# Patient Record
Sex: Female | Born: 1945 | Race: White | Hispanic: No | Marital: Married | Smoking: Never smoker
Health system: Southern US, Community
[De-identification: ages and names within clinical notes are randomized; demographics above are authoritative.]

## PROBLEM LIST (undated history)

## (undated) DIAGNOSIS — K219 Gastro-esophageal reflux disease without esophagitis: Secondary | ICD-10-CM

## (undated) DIAGNOSIS — M1990 Unspecified osteoarthritis, unspecified site: Secondary | ICD-10-CM

## (undated) DIAGNOSIS — H269 Unspecified cataract: Secondary | ICD-10-CM

## (undated) DIAGNOSIS — R112 Nausea with vomiting, unspecified: Secondary | ICD-10-CM

## (undated) DIAGNOSIS — H3530 Unspecified macular degeneration: Secondary | ICD-10-CM

## (undated) DIAGNOSIS — T4145XA Adverse effect of unspecified anesthetic, initial encounter: Secondary | ICD-10-CM

## (undated) DIAGNOSIS — T8859XA Other complications of anesthesia, initial encounter: Secondary | ICD-10-CM

## (undated) DIAGNOSIS — M810 Age-related osteoporosis without current pathological fracture: Secondary | ICD-10-CM

## (undated) DIAGNOSIS — Z9889 Other specified postprocedural states: Secondary | ICD-10-CM

## (undated) HISTORY — PX: OTHER SURGICAL HISTORY: SHX169

## (undated) HISTORY — DX: Age-related osteoporosis without current pathological fracture: M81.0

## (undated) HISTORY — DX: Unspecified cataract: H26.9

## (undated) HISTORY — PX: ABDOMINAL HYSTERECTOMY: SHX81

## (undated) HISTORY — DX: Unspecified macular degeneration: H35.30

---

## 2001-04-24 ENCOUNTER — Ambulatory Visit (HOSPITAL_COMMUNITY): Admission: RE | Admit: 2001-04-24 | Discharge: 2001-04-24 | Payer: Self-pay | Admitting: Pediatrics

## 2001-04-24 ENCOUNTER — Encounter: Payer: Self-pay | Admitting: General Surgery

## 2001-08-31 ENCOUNTER — Ambulatory Visit (HOSPITAL_COMMUNITY): Admission: RE | Admit: 2001-08-31 | Discharge: 2001-08-31 | Payer: Self-pay | Admitting: General Surgery

## 2001-08-31 ENCOUNTER — Encounter: Payer: Self-pay | Admitting: General Surgery

## 2001-12-27 ENCOUNTER — Other Ambulatory Visit: Admission: RE | Admit: 2001-12-27 | Discharge: 2001-12-27 | Payer: Self-pay | Admitting: General Surgery

## 2002-05-08 ENCOUNTER — Encounter: Payer: Self-pay | Admitting: General Surgery

## 2002-05-08 ENCOUNTER — Ambulatory Visit (HOSPITAL_COMMUNITY): Admission: RE | Admit: 2002-05-08 | Discharge: 2002-05-08 | Payer: Self-pay | Admitting: General Surgery

## 2002-05-11 ENCOUNTER — Ambulatory Visit (HOSPITAL_COMMUNITY): Admission: RE | Admit: 2002-05-11 | Discharge: 2002-05-11 | Payer: Self-pay | Admitting: General Surgery

## 2002-05-11 ENCOUNTER — Encounter: Payer: Self-pay | Admitting: General Surgery

## 2003-01-04 ENCOUNTER — Ambulatory Visit (HOSPITAL_COMMUNITY): Admission: RE | Admit: 2003-01-04 | Discharge: 2003-01-04 | Payer: Self-pay | Admitting: Internal Medicine

## 2003-03-22 ENCOUNTER — Ambulatory Visit (HOSPITAL_COMMUNITY): Admission: RE | Admit: 2003-03-22 | Discharge: 2003-03-22 | Payer: Self-pay | Admitting: Pulmonary Disease

## 2003-05-09 ENCOUNTER — Ambulatory Visit (HOSPITAL_COMMUNITY): Admission: RE | Admit: 2003-05-09 | Discharge: 2003-05-09 | Payer: Self-pay | Admitting: General Surgery

## 2003-05-09 ENCOUNTER — Encounter: Payer: Self-pay | Admitting: General Surgery

## 2003-05-10 ENCOUNTER — Encounter: Payer: Self-pay | Admitting: Orthopedic Surgery

## 2003-05-10 ENCOUNTER — Ambulatory Visit (HOSPITAL_COMMUNITY): Admission: RE | Admit: 2003-05-10 | Discharge: 2003-05-10 | Payer: Self-pay | Admitting: Orthopedic Surgery

## 2003-05-24 ENCOUNTER — Ambulatory Visit (HOSPITAL_COMMUNITY): Admission: RE | Admit: 2003-05-24 | Discharge: 2003-05-24 | Payer: Self-pay | Admitting: General Surgery

## 2003-05-24 ENCOUNTER — Encounter: Payer: Self-pay | Admitting: General Surgery

## 2004-05-11 ENCOUNTER — Ambulatory Visit (HOSPITAL_COMMUNITY): Admission: RE | Admit: 2004-05-11 | Discharge: 2004-05-11 | Payer: Self-pay | Admitting: General Surgery

## 2004-05-20 ENCOUNTER — Ambulatory Visit (HOSPITAL_COMMUNITY): Admission: RE | Admit: 2004-05-20 | Discharge: 2004-05-20 | Payer: Self-pay | Admitting: Pediatrics

## 2004-09-18 ENCOUNTER — Ambulatory Visit (HOSPITAL_COMMUNITY): Admission: RE | Admit: 2004-09-18 | Discharge: 2004-09-18 | Payer: Self-pay | Admitting: Pulmonary Disease

## 2005-03-29 ENCOUNTER — Ambulatory Visit: Payer: Self-pay | Admitting: *Deleted

## 2005-04-05 ENCOUNTER — Ambulatory Visit: Payer: Self-pay | Admitting: Cardiology

## 2005-04-05 ENCOUNTER — Encounter (HOSPITAL_COMMUNITY): Admission: RE | Admit: 2005-04-05 | Discharge: 2005-05-05 | Payer: Self-pay | Admitting: *Deleted

## 2005-04-12 ENCOUNTER — Ambulatory Visit: Payer: Self-pay | Admitting: *Deleted

## 2005-05-14 ENCOUNTER — Ambulatory Visit (HOSPITAL_COMMUNITY): Admission: RE | Admit: 2005-05-14 | Discharge: 2005-05-14 | Payer: Self-pay | Admitting: General Surgery

## 2006-05-20 ENCOUNTER — Ambulatory Visit (HOSPITAL_COMMUNITY): Admission: RE | Admit: 2006-05-20 | Discharge: 2006-05-20 | Payer: Self-pay | Admitting: General Surgery

## 2007-06-02 ENCOUNTER — Ambulatory Visit (HOSPITAL_COMMUNITY): Admission: RE | Admit: 2007-06-02 | Discharge: 2007-06-02 | Payer: Self-pay | Admitting: General Surgery

## 2007-06-14 ENCOUNTER — Ambulatory Visit (HOSPITAL_COMMUNITY): Admission: RE | Admit: 2007-06-14 | Discharge: 2007-06-14 | Payer: Self-pay | Admitting: Pulmonary Disease

## 2008-06-07 ENCOUNTER — Ambulatory Visit (HOSPITAL_COMMUNITY): Admission: RE | Admit: 2008-06-07 | Discharge: 2008-06-07 | Payer: Self-pay | Admitting: General Surgery

## 2009-06-13 ENCOUNTER — Ambulatory Visit (HOSPITAL_COMMUNITY): Admission: RE | Admit: 2009-06-13 | Discharge: 2009-06-13 | Payer: Self-pay | Admitting: General Surgery

## 2009-07-30 ENCOUNTER — Encounter (INDEPENDENT_AMBULATORY_CARE_PROVIDER_SITE_OTHER): Payer: Self-pay | Admitting: Orthopedic Surgery

## 2009-07-30 ENCOUNTER — Ambulatory Visit (HOSPITAL_BASED_OUTPATIENT_CLINIC_OR_DEPARTMENT_OTHER): Admission: RE | Admit: 2009-07-30 | Discharge: 2009-07-30 | Payer: Self-pay | Admitting: Orthopedic Surgery

## 2009-09-05 ENCOUNTER — Ambulatory Visit (HOSPITAL_COMMUNITY): Admission: RE | Admit: 2009-09-05 | Discharge: 2009-09-05 | Payer: Self-pay | Admitting: General Surgery

## 2010-05-15 ENCOUNTER — Ambulatory Visit (HOSPITAL_COMMUNITY): Admission: RE | Admit: 2010-05-15 | Discharge: 2010-05-15 | Payer: Self-pay | Admitting: Pulmonary Disease

## 2010-06-19 ENCOUNTER — Ambulatory Visit (HOSPITAL_COMMUNITY): Admission: RE | Admit: 2010-06-19 | Discharge: 2010-06-19 | Payer: Self-pay | Admitting: General Surgery

## 2011-02-26 LAB — POCT HEMOGLOBIN-HEMACUE: Hemoglobin: 14.7 g/dL (ref 12.0–15.0)

## 2011-04-09 NOTE — Op Note (Signed)
NAMEPAULEEN, Gina Gibbs                          ACCOUNT NO.:  [id number]   MEDICAL RECORD NO.:  [id number]                   PATIENT TYPE:  AMB   LOCATION:  DAY                                  FACILITY:  [facility]   PHYSICIAN:  Lionel December, M.D.                 DATE OF BIRTH:  [birth year]   DATE OF PROCEDURE:  01/04/2003  DATE OF DISCHARGE:                                 OPERATIVE REPORT   PROCEDURE:  Esophagogastroduodenoscopy.   ENDOSCOPIST:  Lionel December, M.D.   INDICATIONS:  This patient is a 65 year old Caucasian female who has  symptoms of GERD poorly controlled with therapy.  She started with cough  about 14 months ago and finally responded to Nexium.  She has been  maintained on 40 mg a day and did fine until about 2 months ago when she  started having a feeling of a lump, heartburn and/or chest pain.  Nexium  dose was bumped up to b.i.d.  She was on Fosamax which was discontinued but  she is still having symptoms fairly frequently.  She does not have  dysphagia, epigastric pain or melena.  She is undergoing a diagnostic  esophagogastroduodenoscopy.  The procedure and risks were reviewed with the  patient and informed consent was obtained.   PREOPERATIVE MEDICATIONS:  Cetacaine spray for pharyngeal topical  anesthesia, Demerol 25 mg IV and Versed 5 mg IV in divided dose.   INSTRUMENT:  Olympus video system.   FINDINGS:  Procedure performed in endoscopy suite.  The patient's vital  signs and O2 saturation were monitored during the procedure and remained  stable.  The patient was placed in the left lateral recumbent position and  endoscope was passed via the oropharynx without any difficulty into the  esophagus.   ESOPHAGUS:  Mucosa of the esophagus was normal throughout.  Squamocolumnar  junction was unremarkable.  No hernia was noted.   STOMACH:  It was empty and distended very well with insufflation.  The folds  of the proximal stomach were normal.  Examination of  the mucosa at gastric  body, antrum, pyloric channel, as well as angularis and fundus were normal.   DUODENUM:  Examination of the bulb reveals normal mucosa.  Mucosa and folds  of the second and third part of the duodenum were normal.   Endoscope was withdrawn.  The patient tolerated the procedure well.   FINAL DIAGNOSES:  Normal esophagogastroduodenoscopy.   RECOMMENDATIONS:  1. She will continue antireflux measures as before.  For now I will leave     her on Nexium 40 mg b.i.d. and add     Reglan 10 mg before breakfast and 10 mg before evening meal.  2. She will return for OV in 4 weeks from now.  3. She will continue to hold her Fosamax for now.  Lionel December, M.D.    NR/MEDQ  D:  01/04/2003  T:  01/04/2003  Job:  [id number]   cc:   Ramon Dredge L. Juanetta Gosling, M.D.  [address]  [place]  [place] [address]  Fax: [phone]

## 2011-04-09 NOTE — Procedures (Signed)
[name], Gina Gibbs                ACCOUNT NO.:  [id number]   MEDICAL RECORD NO.:  [id number]          PATIENT TYPE:  OUT   LOCATION:  RAD                           FACILITY:  [facility]   PHYSICIAN:  [place] [place] Bing, M.D.  DATE OF BIRTH:  07-22-1946   DATE OF PROCEDURE:  04/05/2005  DATE OF DISCHARGE:                                  ECHOCARDIOGRAM   REFERRING PHYSICIAN:  Dr. Malvin Johns, Dr. Dorethea Clan.   CLINICAL DATA:  A 65 year old woman with chest pain.   Aorta 2.6.  Left atrium 3.5.  Septum 1.1.  Posterior wall 1.1.  LV diastole  4.1.  LV systole 2.6.   RESULTS:  1.  A technically adequate echocardiographic study.  2.  Normal left and right atrial size.  Normal right ventricular size and      function.  Borderline right ventricular hypertrophy.  3.  Mild aortic valvular sclerosis.  Mild calcification of the proximal      ascending aorta.  4.  Normal tricuspid valve.  Mild regurgitation.  Normal estimated right      ventricular systolic pressure.  5.  Mild mitral valve thickening.  Mild mitral valve prolapse.  Mild annular      calcification.  Trivial late mitral regurgitation.  6.  Normal pulmonic valve and proximal pulmonary artery.  7.  Normal left ventricular size, wall thickness, regional and global      function.  8.  Normal inferior vena cava.      RR/MEDQ  D:  04/05/2005  T:  04/05/2005  Job:  [id number]

## 2011-07-05 ENCOUNTER — Other Ambulatory Visit (HOSPITAL_COMMUNITY): Payer: Self-pay | Admitting: General Surgery

## 2011-07-05 DIAGNOSIS — Z139 Encounter for screening, unspecified: Secondary | ICD-10-CM

## 2011-07-08 ENCOUNTER — Ambulatory Visit (HOSPITAL_COMMUNITY)
Admission: RE | Admit: 2011-07-08 | Discharge: 2011-07-08 | Disposition: A | Discharge status: Home / Self Care | Payer: BC Managed Care – PPO | Source: Ambulatory Visit | Attending: General Surgery | Admitting: General Surgery

## 2011-07-08 DIAGNOSIS — Z1231 Encounter for screening mammogram for malignant neoplasm of breast: Secondary | ICD-10-CM | POA: Insufficient documentation

## 2011-07-08 DIAGNOSIS — Z139 Encounter for screening, unspecified: Secondary | ICD-10-CM

## 2012-02-10 ENCOUNTER — Other Ambulatory Visit: Payer: Self-pay | Admitting: Orthopedic Surgery

## 2012-02-21 ENCOUNTER — Encounter (HOSPITAL_BASED_OUTPATIENT_CLINIC_OR_DEPARTMENT_OTHER): Payer: Self-pay | Admitting: *Deleted

## 2012-02-21 NOTE — Progress Notes (Signed)
No labs needed

## 2012-02-23 ENCOUNTER — Encounter (HOSPITAL_BASED_OUTPATIENT_CLINIC_OR_DEPARTMENT_OTHER): Payer: Self-pay | Admitting: *Deleted

## 2012-02-23 ENCOUNTER — Ambulatory Visit (HOSPITAL_BASED_OUTPATIENT_CLINIC_OR_DEPARTMENT_OTHER)
Admission: RE | Admit: 2012-02-23 | Discharge: 2012-02-23 | Disposition: A | Discharge status: Home / Self Care | Payer: BC Managed Care – PPO | Source: Ambulatory Visit | Attending: Orthopedic Surgery | Admitting: Orthopedic Surgery

## 2012-02-23 ENCOUNTER — Encounter (HOSPITAL_BASED_OUTPATIENT_CLINIC_OR_DEPARTMENT_OTHER)
Admission: RE | Disposition: A | Discharge status: Home / Self Care | Payer: Self-pay | Source: Ambulatory Visit | Attending: Orthopedic Surgery

## 2012-02-23 ENCOUNTER — Encounter (HOSPITAL_BASED_OUTPATIENT_CLINIC_OR_DEPARTMENT_OTHER): Payer: Self-pay | Admitting: Anesthesiology

## 2012-02-23 ENCOUNTER — Ambulatory Visit (HOSPITAL_BASED_OUTPATIENT_CLINIC_OR_DEPARTMENT_OTHER): Payer: BC Managed Care – PPO | Admitting: Anesthesiology

## 2012-02-23 DIAGNOSIS — M19049 Primary osteoarthritis, unspecified hand: Secondary | ICD-10-CM | POA: Insufficient documentation

## 2012-02-23 DIAGNOSIS — M6740 Ganglion, unspecified site: Secondary | ICD-10-CM | POA: Insufficient documentation

## 2012-02-23 HISTORY — DX: Unspecified osteoarthritis, unspecified site: M19.90

## 2012-02-23 HISTORY — DX: Other specified postprocedural states: Z98.890

## 2012-02-23 HISTORY — DX: Adverse effect of unspecified anesthetic, initial encounter: T41.45XA

## 2012-02-23 HISTORY — DX: Gastro-esophageal reflux disease without esophagitis: K21.9

## 2012-02-23 HISTORY — DX: Other complications of anesthesia, initial encounter: T88.59XA

## 2012-02-23 HISTORY — PX: EAR CYST EXCISION: SHX22

## 2012-02-23 HISTORY — DX: Other specified postprocedural states: R11.2

## 2012-02-23 SURGERY — CYST REMOVAL
Anesthesia: Monitor Anesthesia Care | Site: Finger | Laterality: Left | Wound class: Clean

## 2012-02-23 MED ORDER — CHLORHEXIDINE GLUCONATE 4 % EX LIQD: 60.0000 mL | Freq: Once | CUTANEOUS | Status: DC

## 2012-02-23 MED ORDER — PROPOFOL 10 MG/ML IV EMUL
INTRAVENOUS | Status: DC | PRN
  Administered 2012-02-23: 50 ug/kg/min via INTRAVENOUS

## 2012-02-23 MED ORDER — LACTATED RINGERS IV SOLN
INTRAVENOUS | Status: DC
  Administered 2012-02-23 (×2): via INTRAVENOUS

## 2012-02-23 MED ORDER — FENTANYL CITRATE 0.05 MG/ML IJ SOLN
INTRAMUSCULAR | Status: DC | PRN
  Administered 2012-02-23: 50 ug via INTRAVENOUS

## 2012-02-23 MED ORDER — BUPIVACAINE HCL (PF) 0.25 % IJ SOLN
INTRAMUSCULAR | Status: DC | PRN
  Administered 2012-02-23: 4 mL

## 2012-02-23 MED ORDER — ONDANSETRON HCL 4 MG/2ML IJ SOLN
INTRAMUSCULAR | Status: DC | PRN
  Administered 2012-02-23: 4 mg via INTRAVENOUS

## 2012-02-23 MED ORDER — MORPHINE SULFATE 4 MG/ML IJ SOLN: 0.0500 mg/kg | INTRAMUSCULAR | Status: DC | PRN

## 2012-02-23 MED ORDER — HYDROCODONE-ACETAMINOPHEN 5-500 MG PO TABS: 1.0000 | ORAL_TABLET | ORAL | Status: AC | PRN

## 2012-02-23 MED ORDER — MIDAZOLAM HCL 5 MG/5ML IJ SOLN
INTRAMUSCULAR | Status: DC | PRN
  Administered 2012-02-23: 1 mg via INTRAVENOUS

## 2012-02-23 MED ORDER — CEFAZOLIN SODIUM 1-5 GM-% IV SOLN
1.0000 g | INTRAVENOUS | Status: AC
  Administered 2012-02-23: 1 g via INTRAVENOUS

## 2012-02-23 MED ORDER — HYDROMORPHONE HCL PF 1 MG/ML IJ SOLN: 0.2500 mg | INTRAMUSCULAR | Status: DC | PRN

## 2012-02-23 MED ORDER — ONDANSETRON HCL 4 MG/2ML IJ SOLN: 4.0000 mg | Freq: Once | INTRAMUSCULAR | Status: DC | PRN

## 2012-02-23 SURGICAL SUPPLY — 50 items
BANDAGE COBAN STERILE 2 (GAUZE/BANDAGES/DRESSINGS) IMPLANT
BANDAGE GAUZE ELAST BULKY 4 IN (GAUZE/BANDAGES/DRESSINGS) IMPLANT
BLADE MINI RND TIP GREEN BEAV (BLADE) IMPLANT
BLADE SURG 15 STRL LF DISP TIS (BLADE) ×1 IMPLANT
BLADE SURG 15 STRL SS (BLADE) ×2
BNDG CMPR 9X4 STRL LF SNTH (GAUZE/BANDAGES/DRESSINGS)
BNDG COHESIVE 1X5 TAN STRL LF (GAUZE/BANDAGES/DRESSINGS) ×1 IMPLANT
BNDG COHESIVE 3X5 TAN STRL LF (GAUZE/BANDAGES/DRESSINGS) IMPLANT
BNDG ESMARK 4X9 LF (GAUZE/BANDAGES/DRESSINGS) IMPLANT
CHLORAPREP W/TINT 26ML (MISCELLANEOUS) ×2 IMPLANT
CLOTH BEACON ORANGE TIMEOUT ST (SAFETY) ×2 IMPLANT
CORDS BIPOLAR (ELECTRODE) ×2 IMPLANT
COVER MAYO STAND STRL (DRAPES) ×2 IMPLANT
COVER TABLE BACK 60X90 (DRAPES) ×2 IMPLANT
CUFF TOURNIQUET SINGLE 18IN (TOURNIQUET CUFF) ×1 IMPLANT
DECANTER SPIKE VIAL GLASS SM (MISCELLANEOUS) IMPLANT
DRAIN PENROSE 1/2X12 LTX STRL (WOUND CARE) IMPLANT
DRAPE EXTREMITY T 121X128X90 (DRAPE) ×2 IMPLANT
DRAPE SURG 17X23 STRL (DRAPES) ×2 IMPLANT
GAUZE XEROFORM 1X8 LF (GAUZE/BANDAGES/DRESSINGS) ×2 IMPLANT
GLOVE BIO SURGEON STRL SZ 6.5 (GLOVE) IMPLANT
GLOVE BIOGEL PI IND STRL 8 (GLOVE) ×1 IMPLANT
GLOVE BIOGEL PI INDICATOR 8 (GLOVE) ×1
GLOVE SURG ORTHO 8.0 STRL STRW (GLOVE) ×2 IMPLANT
GLOVE SURG SS PI 8.0 STRL IVOR (GLOVE) ×1 IMPLANT
GOWN BRE IMP PREV XXLGXLNG (GOWN DISPOSABLE) ×2 IMPLANT
GOWN PREVENTION PLUS XLARGE (GOWN DISPOSABLE) ×1 IMPLANT
NEEDLE 27GAX1X1/2 (NEEDLE) ×1 IMPLANT
NS IRRIG 1000ML POUR BTL (IV SOLUTION) ×2 IMPLANT
PACK BASIN DAY SURGERY FS (CUSTOM PROCEDURE TRAY) ×2 IMPLANT
PAD CAST 3X4 CTTN HI CHSV (CAST SUPPLIES) IMPLANT
PADDING CAST ABS 3INX4YD NS (CAST SUPPLIES)
PADDING CAST ABS 4INX4YD NS (CAST SUPPLIES)
PADDING CAST ABS COTTON 3X4 (CAST SUPPLIES) IMPLANT
PADDING CAST ABS COTTON 4X4 ST (CAST SUPPLIES) ×1 IMPLANT
PADDING CAST COTTON 3X4 STRL (CAST SUPPLIES)
SPLINT FINGER 5/8X2.25 (CAST SUPPLIES) IMPLANT
SPLINT PLASTALUME 2 1/4 (CAST SUPPLIES) ×2
SPLINT PLASTER CAST XFAST 3X15 (CAST SUPPLIES) IMPLANT
SPLINT PLASTER XTRA FASTSET 3X (CAST SUPPLIES)
SPONGE GAUZE 4X4 12PLY (GAUZE/BANDAGES/DRESSINGS) ×2 IMPLANT
STOCKINETTE 4X48 STRL (DRAPES) ×2 IMPLANT
SUT VIC AB 4-0 P2 18 (SUTURE) IMPLANT
SUT VICRYL RAPID 5 0 P 3 (SUTURE) IMPLANT
SUT VICRYL RAPIDE 4/0 PS 2 (SUTURE) ×2 IMPLANT
SYR BULB 3OZ (MISCELLANEOUS) ×2 IMPLANT
SYR CONTROL 10ML LL (SYRINGE) ×1 IMPLANT
TOWEL OR 17X24 6PK STRL BLUE (TOWEL DISPOSABLE) ×4 IMPLANT
UNDERPAD 30X30 INCONTINENT (UNDERPADS AND DIAPERS) ×2 IMPLANT
WATER STERILE IRR 1000ML POUR (IV SOLUTION) ×1 IMPLANT

## 2012-02-23 NOTE — Discharge Instructions (Addendum)

## 2012-02-23 NOTE — Anesthesia Preprocedure Evaluation (Addendum)
Anesthesia Evaluation  Patient identified by MRN, date of birth, ID band Patient awake    Reviewed: Allergy & Precautions, H&P , NPO status , Patient's Chart, lab work & pertinent test results  Airway Mallampati: I TM Distance: >3 FB Neck ROM: Full    Dental  (+) Teeth Intact and Dental Advisory Given   Pulmonary  breath sounds clear to auscultation        Cardiovascular + Valvular Problems/Murmurs MVP Rhythm:Regular Rate:Normal     Neuro/Psych    GI/Hepatic   Endo/Other    Renal/GU      Musculoskeletal   Abdominal   Peds  Hematology   Anesthesia Other Findings   Reproductive/Obstetrics                           Anesthesia Physical Anesthesia Plan  ASA: II  Anesthesia Plan: Bier Block and MAC   Post-op Pain Management:    Induction: Intravenous  Airway Management Planned: Simple Face Mask  Additional Equipment:   Intra-op Plan:   Post-operative Plan:   Informed Consent: I have reviewed the patients History and Physical, chart, labs and discussed the procedure including the risks, benefits and alternatives for the proposed anesthesia with the patient or authorized representative who has indicated his/her understanding and acceptance.   Dental advisory given  Plan Discussed with: CRNA, Anesthesiologist and Surgeon  Anesthesia Plan Comments:         Anesthesia Quick Evaluation

## 2012-02-23 NOTE — Brief Op Note (Signed)
02/23/2012  9:09 AM  PATIENT:  Gina Gibbs  66 y.o. female  PRE-OPERATIVE DIAGNOSIS:  cyst left index finger interphalangeal,loose body  POST-OPERATIVE DIAGNOSIS:  cyst left index finger interphalangeal,loose body  PROCEDURE:  Procedure(s) (LRB): CYST REMOVAL (Left)  SURGEON:  Surgeon(s) and Role:    * Nicki Reaper, MD - Primary  PHYSICIAN ASSISTANT:   ASSISTANTS: none   ANESTHESIA:   local and regional  EBL:     BLOOD ADMINISTERED:none  DRAINS: none   LOCAL MEDICATIONS USED:  MARCAINE     SPECIMEN:  Excision  DISPOSITION OF SPECIMEN:  PATHOLOGY  COUNTS:  YES  TOURNIQUET:   Total Tourniquet Time Documented: Forearm (Left) - 25 minutes  DICTATION: .Other Dictation: Dictation Number [id number]  PLAN OF CARE: Discharge to home after PACU  PATIENT DISPOSITION:  PACU - hemodynamically stable.

## 2012-02-23 NOTE — Anesthesia Postprocedure Evaluation (Signed)
  Anesthesia Post-op Note  Patient: Gina Gibbs  Procedure(s) Performed: Procedure(s) (LRB): CYST REMOVAL (Left)  Patient Location: PACU  Anesthesia Type: MAC and Bier block  Level of Consciousness: awake, alert  and oriented  Airway and Oxygen Therapy: Patient Spontanous Breathing  Post-op Pain: none  Post-op Assessment: Post-op Vital signs reviewed  Post-op Vital Signs: Reviewed  Complications: No apparent anesthesia complications

## 2012-02-23 NOTE — Transfer of Care (Signed)
Immediate Anesthesia Transfer of Care Note  Patient: Gina Gibbs  Procedure(s) Performed: Procedure(s) (LRB): CYST REMOVAL (Left)  Patient Location: PACU  Anesthesia Type: Bier block  Level of Consciousness: sedated  Airway & Oxygen Therapy: Patient Spontanous Breathing and Patient connected to face mask oxygen  Post-op Assessment: Report given to PACU RN and Post -op Vital signs reviewed and stable  Post vital signs: Reviewed and stable  Complications: No apparent anesthesia complications

## 2012-02-23 NOTE — Op Note (Signed)
NAME:  SELBY, SLOVACEK NO.:  MEDICAL RECORD NO.:  [id number]  LOCATION:                                 FACILITY:  PHYSICIAN:  Cindee Salt, M.D.            DATE OF BIRTH:  DATE OF PROCEDURE:  02/23/2012 DATE OF DISCHARGE:                              OPERATIVE REPORT   PREOPERATIVE DIAGNOSES:  Mucoid cyst, left index finger.  Degenerative arthritis, distal interphalangeal joint.  POSTOPERATIVE DIAGNOSES:  Mucoid cyst, left index finger.  Degenerative arthritis, distal interphalangeal joint.  OPERATION:  Excision of mucoid cyst; debridement of distal interphalangeal joint, left index finger.  SURGEON:  Cindee Salt, M.D.  ANESTHESIA:  Forearm-based IV regional with metacarpal block.  ANESTHESIOLOGIST:  Sheldon Silvan, M.D.  HISTORY:  The patient is a 66 year old female with a history of mucoid cyst; degenerative arthritis, distal interphalangeal joint, left index finger.  She has elected to undergo surgical resection of the arthritis, excision of the cyst, debridement of the joint possible loose body. Pre, peri, and postoperative course had been discussed along with risks and complications.  She is aware that there is no guarantee with the surgery, possibility of infection, recurrence, injury to arteries, nerves, tendons, incomplete relief of symptoms, dystrophy.  In preoperative area, the patient is seen, the extremity marked by both the patient and surgeon.  Antibiotic given.  PROCEDURE:  The patient was brought to the operating room where a forearm-based IV regional anesthetic was carried out without difficulty. She was prepped using ChloraPrep, supine position, left arm free.  A 3- minute dry time was allowed.  Time-out taken, confirming the patient and procedure.  A curvilinear incision was made over the distal interphalangeal joint, left index finger, carried down through subcutaneous tissue.  Bleeders were electrocauterized with  bipolar. Osteophytes were present both radially and ulnarly.  A small cyst was present at the dorsal ulnar aspect.  This was excised and sent to Pathology.  A synovectomy of the joint was performed.  Significant osteophyte formation was present.  This was debrided with a small rongeur and curette.  The entire specimen was sent to Pathology.  The wound was irrigated with saline.  No discreet loose body was found other than that which may have been in the synovial tissue.  The wound was irrigated.  The skin then closed with interrupted 5- 0 Vicryl Rapide sutures.  A metacarpal block was given with 0.25% Marcaine without epinephrine, 4 mL was used.  A sterile compressive dressing and splint to the distal interphalangeal joint was applied. Deflation of the tourniquet, the remaining fingers turned pinked.  She was taken to the recovery room for observation in satisfactory condition.          ______________________________ Cindee Salt, M.D.     GK/MEDQ  D:  02/23/2012  T:  02/23/2012  Job:  [id number]

## 2012-02-23 NOTE — H&P (Signed)
Gina Gibbs  is complaining of her left index finger. She is right handed, 66 years old. This is at the radial aspect of the DIP joint has been swelling causing pain and discomfort. This has been gradually increasing. She recalls no specific history of injury. She states it has been going on for the past several weeks. She complains of intermittent mild, throbbing aching pain. Activity makes it worse and rest has helped.   Past Medical History: She has no allergies. She is on Celexa. She has had a hysterectomy and mucoid cyst removed.   Family Medical History: Negative.  Social History: She does not smoke or drink. She is a [employer] operational officer. Review of Systems: Positive for contacts, otherwise negative for 14 points.  Gina Gibbs is an 66 y.o. female.   Chief Complaint: mucoid cyst lif HPI: see above  Past Medical History  Diagnosis Date  . Complication of anesthesia   . PONV (postoperative nausea and vomiting)   . Arthritis   . GERD (gastroesophageal reflux disease)     History reviewed. No pertinent past surgical history.  History reviewed. No pertinent family history. Social History:  reports that she has never smoked. She does not have any smokeless tobacco history on file. She reports that she drinks alcohol. She reports that she does not use illicit drugs.  Allergies:  Allergies  Allergen Reactions  . Tape     adhesives    Medications Prior to Admission  Medication Dose Route Frequency Provider Last Rate Last Dose  . ceFAZolin (ANCEF) IVPB 1 g/50 mL premix  1 g Intravenous 60 min Pre-Op Nicki Reaper, MD      . chlorhexidine (HIBICLENS) 4 % liquid 4 application  60 mL Topical Once Nicki Reaper, MD      . lactated ringers infusion   Intravenous Continuous Hart Robinsons, MD 20 mL/hr at 02/23/12 9147     Medications Prior to Admission  Medication Sig Dispense Refill  . celecoxib (CELEBREX) 200 MG capsule Take 200 mg by mouth 2 (two) times daily.      Marland Kitchen esomeprazole  (NEXIUM) 40 MG capsule Take 40 mg by mouth as needed.      . therapeutic multivitamin-minerals (THERAGRAN-M) tablet Take 1 tablet by mouth daily.        No results found for this or any previous visit (from the past 48 hour(s)).  No results found.   Pertinent items are noted in HPI.  Blood pressure 125/88, pulse 63, temperature 98 F (36.7 C), temperature source Oral, resp. rate 20, height 5\' 2"  (1.575 m), weight 58.968 kg (130 lb), SpO2 98.00%.  General appearance: alert, cooperative and appears stated age Head: Normocephalic, without obvious abnormality Neck: no adenopathy Resp: clear to auscultation bilaterally Cardio: regular rate and rhythm, S1, S2 normal, no murmur, click, rub or gallop GI: soft, non-tender; bowel sounds normal; no masses,  no organomegaly Extremities: extremities normal, atraumatic, no cyanosis or edema Pulses: 2+ and symmetric Skin: Skin color, texture, turgor normal. No rashes or lesions Neurologic: Grossly normal Incision/Wound: na  Assessment/Plan Diagnosis: DIP joint arthritis. Small mucoid cyst. Palpable loose body.  We have discussed this with her. She would like to have this excised. The pre, peri and post op course are discussed along with risks and complications. She is aware there is no guarantee with surgery, possibility of infection, recurrence, injury to arteries, nerves and tendons, incomplete relief of symptoms and dystrophy.  This will be scheduled as an outpatient for debridement DIP joint,  excision cyst, possible loose body left index finger.  Roderick Calo R 02/23/2012, 7:37 AM

## 2012-02-23 NOTE — Progress Notes (Signed)
Pt prescription for Vicodin called into [facility] in [place], [place] Called into Pharmacist Lenore Manner by Mayford Knife

## 2012-02-23 NOTE — Op Note (Signed)
Dictated number: [id number]

## 2012-02-24 ENCOUNTER — Encounter (HOSPITAL_BASED_OUTPATIENT_CLINIC_OR_DEPARTMENT_OTHER): Payer: Self-pay | Admitting: Orthopedic Surgery

## 2012-06-19 ENCOUNTER — Other Ambulatory Visit (HOSPITAL_COMMUNITY): Payer: Self-pay | Admitting: General Surgery

## 2012-06-19 DIAGNOSIS — Z139 Encounter for screening, unspecified: Secondary | ICD-10-CM

## 2012-07-10 ENCOUNTER — Ambulatory Visit (HOSPITAL_COMMUNITY)
Admission: RE | Admit: 2012-07-10 | Discharge: 2012-07-10 | Disposition: A | Discharge status: Home / Self Care | Payer: BC Managed Care – PPO | Source: Ambulatory Visit | Attending: General Surgery | Admitting: General Surgery

## 2012-07-10 DIAGNOSIS — Z139 Encounter for screening, unspecified: Secondary | ICD-10-CM

## 2012-07-10 DIAGNOSIS — Z1231 Encounter for screening mammogram for malignant neoplasm of breast: Secondary | ICD-10-CM | POA: Insufficient documentation

## 2013-02-28 DIAGNOSIS — K219 Gastro-esophageal reflux disease without esophagitis: Secondary | ICD-10-CM

## 2013-02-28 DIAGNOSIS — E785 Hyperlipidemia, unspecified: Secondary | ICD-10-CM

## 2013-02-28 HISTORY — DX: Gastro-esophageal reflux disease without esophagitis: K21.9

## 2013-02-28 HISTORY — DX: Hyperlipidemia, unspecified: E78.5

## 2013-06-05 ENCOUNTER — Other Ambulatory Visit (HOSPITAL_COMMUNITY): Payer: Self-pay | Admitting: Pulmonary Disease

## 2013-06-05 DIAGNOSIS — Z139 Encounter for screening, unspecified: Secondary | ICD-10-CM

## 2013-07-12 ENCOUNTER — Ambulatory Visit (HOSPITAL_COMMUNITY)
Admission: RE | Admit: 2013-07-12 | Discharge: 2013-07-12 | Disposition: A | Discharge status: Home / Self Care | Payer: BC Managed Care – PPO | Source: Ambulatory Visit | Attending: Pulmonary Disease | Admitting: Pulmonary Disease

## 2013-07-12 DIAGNOSIS — Z1231 Encounter for screening mammogram for malignant neoplasm of breast: Secondary | ICD-10-CM | POA: Insufficient documentation

## 2013-07-12 DIAGNOSIS — Z139 Encounter for screening, unspecified: Secondary | ICD-10-CM

## 2013-09-20 ENCOUNTER — Encounter: Payer: Self-pay | Admitting: Pulmonary Disease

## 2013-09-22 HISTORY — PX: FRACTURE SURGERY: SHX138

## 2013-09-26 DIAGNOSIS — E039 Hypothyroidism, unspecified: Secondary | ICD-10-CM

## 2013-09-26 HISTORY — DX: Hypothyroidism, unspecified: E03.9

## 2013-10-09 ENCOUNTER — Emergency Department (HOSPITAL_COMMUNITY)
Admission: EM | Admit: 2013-10-09 | Discharge: 2013-10-09 | Disposition: A | Discharge status: Home / Self Care | Payer: Medicare PPO | Attending: Emergency Medicine | Admitting: Emergency Medicine

## 2013-10-09 ENCOUNTER — Emergency Department (HOSPITAL_COMMUNITY): Payer: Medicare PPO

## 2013-10-09 ENCOUNTER — Encounter (HOSPITAL_COMMUNITY): Payer: Self-pay | Admitting: Emergency Medicine

## 2013-10-09 DIAGNOSIS — Y929 Unspecified place or not applicable: Secondary | ICD-10-CM | POA: Insufficient documentation

## 2013-10-09 DIAGNOSIS — Z8781 Personal history of (healed) traumatic fracture: Secondary | ICD-10-CM | POA: Insufficient documentation

## 2013-10-09 DIAGNOSIS — Z791 Long term (current) use of non-steroidal anti-inflammatories (NSAID): Secondary | ICD-10-CM | POA: Insufficient documentation

## 2013-10-09 DIAGNOSIS — M129 Arthropathy, unspecified: Secondary | ICD-10-CM | POA: Insufficient documentation

## 2013-10-09 DIAGNOSIS — S8263XA Displaced fracture of lateral malleolus of unspecified fibula, initial encounter for closed fracture: Secondary | ICD-10-CM | POA: Insufficient documentation

## 2013-10-09 DIAGNOSIS — S82401A Unspecified fracture of shaft of right fibula, initial encounter for closed fracture: Secondary | ICD-10-CM

## 2013-10-09 DIAGNOSIS — K219 Gastro-esophageal reflux disease without esophagitis: Secondary | ICD-10-CM | POA: Insufficient documentation

## 2013-10-09 DIAGNOSIS — Z79899 Other long term (current) drug therapy: Secondary | ICD-10-CM | POA: Insufficient documentation

## 2013-10-09 DIAGNOSIS — W108XXA Fall (on) (from) other stairs and steps, initial encounter: Secondary | ICD-10-CM | POA: Insufficient documentation

## 2013-10-09 DIAGNOSIS — Y939 Activity, unspecified: Secondary | ICD-10-CM | POA: Insufficient documentation

## 2013-10-09 MED ORDER — TRAMADOL HCL 50 MG PO TABS: 50.0000 mg | ORAL_TABLET | Freq: Four times a day (QID) | ORAL | Status: DC | PRN

## 2013-10-09 NOTE — ED Notes (Signed)
Pt fell down few steps just PTA and now has pain to right foot and ankle

## 2013-10-09 NOTE — ED Notes (Signed)
Pt seen and evaluated by EDPa for initial assessment. 

## 2013-10-09 NOTE — ED Provider Notes (Signed)
CSN: [id number]     Arrival date & time 10/09/13  1720 History   First MD Initiated Contact with Patient 10/09/13 1731     Chief Complaint  Patient presents with  . Fall  . Ankle Pain   (Consider location/radiation/quality/duration/timing/severity/associated sxs/prior Treatment) HPI Comments: Gina Gibbs is a 67 y.o. Female presenting with pain and swelling to her right lateral ankle and foot after tripping over a doggy gate,  And stumbling down 2 steps just prior to arrival.  Pain is constant and worsened with attempts to weight bear.  She denies radiating of pain into her upper leg or knee and has no numbness distal to the injury site.  She denies other injury including head injury and is no no blood thinners.  She has seen Dr. Madelon Lips for prior orthopedic injury, significant for same right foot fracture years ago.     The history is provided by the patient and the spouse.    Past Medical History  Diagnosis Date  . Complication of anesthesia   . PONV (postoperative nausea and vomiting)   . Arthritis   . GERD (gastroesophageal reflux disease)    Past Surgical History  Procedure Laterality Date  . Ear cyst excision  02/23/2012    Procedure: CYST REMOVAL;  Surgeon: Nicki Reaper, MD;  Location: [facility];  Service: Orthopedics;  Laterality: Left;  debridement distal interphalangeal left index finger, excision cyst/loose body   History reviewed. No pertinent family history. History  Substance Use Topics  . Smoking status: Never Smoker   . Smokeless tobacco: Not on file  . Alcohol Use: Yes     Comment: rare   OB History   Grav Para Term Preterm Abortions TAB SAB Ect Mult Living                 Review of Systems  Constitutional: Negative for fever.  Musculoskeletal: Positive for arthralgias and joint swelling. Negative for myalgias.  Skin: Negative.   Neurological: Negative for weakness and numbness.    Allergies  Tape  Home Medications   Current  Outpatient Rx  Name  Route  Sig  Dispense  Refill  . celecoxib (CELEBREX) 200 MG capsule   Oral   Take 200 mg by mouth 2 (two) times daily.         Marland Kitchen esomeprazole (NEXIUM) 40 MG capsule   Oral   Take 40 mg by mouth as needed.         Marland Kitchen levothyroxine (SYNTHROID, LEVOTHROID) 25 MCG tablet   Oral   Take 25 mcg by mouth daily.         Marland Kitchen therapeutic multivitamin-minerals (THERAGRAN-M) tablet   Oral   Take 1 tablet by mouth daily.         . traMADol (ULTRAM) 50 MG tablet   Oral   Take 1 tablet (50 mg total) by mouth every 6 (six) hours as needed.   20 tablet   0    BP 136/90  Pulse 93  Temp(Src) 98 F (36.7 C) (Oral)  Resp 18  Ht 5\' 2"  (1.575 m)  Wt 135 lb (61.236 kg)  BMI 24.69 kg/m2  SpO2 100% Physical Exam  Constitutional: She appears well-developed and well-nourished.  HENT:  Head: Atraumatic.  Neck: Normal range of motion.  Cardiovascular:  Pulses equal bilaterally  Musculoskeletal: She exhibits edema and tenderness.       Right ankle: She exhibits decreased range of motion and swelling. She exhibits no ecchymosis, no  deformity and normal pulse. Tenderness. Lateral malleolus tenderness found. No head of 5th metatarsal and no proximal fibula tenderness found. Achilles tendon normal.  Neurological: She is alert. She has normal strength. She displays normal reflexes. No sensory deficit.  Equal strength  Skin: Skin is warm and dry.  Psychiatric: She has a normal mood and affect.    ED Course  Procedures (including critical care time) Labs Review Labs Reviewed - No data to display Imaging Review Dg Ankle Complete Right  10/09/2013   CLINICAL DATA:  Injury to the right foot and ankle. Pain and swelling.  EXAM: RIGHT ANKLE - COMPLETE 3+ VIEW  COMPARISON:  No priors.  FINDINGS: Three views of the right ankle demonstrate an oblique fracture through the lateral malleolus best appreciated on the lateral projection. There is approximately 2 mm of posterior  displacement of the distal fracture fragment. The ankle mortise appears preserved. Overlying soft tissues appear swollen, particularly overlying the lateral aspect of the ankle. No other acute displaced fractures are noted.  IMPRESSION: 1. Acute minimally displaced fracture through the lateral malleolus, as above.   Electronically Signed   By: Trudie Reed M.D.   On: 10/09/2013 17:51   Dg Foot Complete Right  10/09/2013   CLINICAL DATA:  History of injury complaining of pain and swelling in the right foot and ankle.  EXAM: RIGHT FOOT COMPLETE - 3+ VIEW  COMPARISON:  No priors.  FINDINGS: Three views of the right foot again demonstrate an oblique fracture through the lateral malleolus, best demonstrated on the lateral projection (previously described). Within the foot itself there is no definite acute displaced fracture, subluxation, dislocation, joint or soft tissue abnormality.  IMPRESSION: 1. No evidence of significant acute traumatic injury to the foot. 2. Oblique fracture through the lateral malleolus redemonstrated.   Electronically Signed   By: Trudie Reed M.D.   On: 10/09/2013 17:53    EKG Interpretation   None       MDM   1. Closed fibular fracture, right, initial encounter    Patients labs and/or radiological studies were viewed and considered during the medical decision making and disposition process.  Pt placed in cam walker,  Crutches given.  Encouraged RICE.  Pt to call her orthopedist Dr. Madelon Lips in am for a recheck this week.  Tramadol prescribed.    Burgess Amor, PA-C 10/09/13 [phone]

## 2013-10-09 NOTE — ED Provider Notes (Signed)
Medical screening examination/treatment/procedure(s) were performed by non-physician practitioner and as supervising physician I was immediately available for consultation/collaboration.   Christopher J. Pollina, MD 10/09/13 1852 

## 2013-11-29 ENCOUNTER — Ambulatory Visit (HOSPITAL_COMMUNITY)
Admission: RE | Admit: 2013-11-29 | Discharge: 2013-11-29 | Disposition: A | Discharge status: Home / Self Care | Payer: Medicare PPO | Source: Ambulatory Visit | Attending: Orthopedic Surgery | Admitting: Orthopedic Surgery

## 2013-11-29 ENCOUNTER — Telehealth (HOSPITAL_COMMUNITY): Payer: Self-pay | Admitting: Physical Therapy

## 2013-11-29 DIAGNOSIS — M6281 Muscle weakness (generalized): Secondary | ICD-10-CM | POA: Insufficient documentation

## 2013-11-29 DIAGNOSIS — M25673 Stiffness of unspecified ankle, not elsewhere classified: Secondary | ICD-10-CM | POA: Insufficient documentation

## 2013-11-29 DIAGNOSIS — R262 Difficulty in walking, not elsewhere classified: Secondary | ICD-10-CM | POA: Insufficient documentation

## 2013-11-29 DIAGNOSIS — IMO0001 Reserved for inherently not codable concepts without codable children: Secondary | ICD-10-CM | POA: Insufficient documentation

## 2013-11-29 DIAGNOSIS — M25579 Pain in unspecified ankle and joints of unspecified foot: Secondary | ICD-10-CM | POA: Insufficient documentation

## 2013-11-29 DIAGNOSIS — M25676 Stiffness of unspecified foot, not elsewhere classified: Secondary | ICD-10-CM | POA: Insufficient documentation

## 2013-11-29 NOTE — Evaluation (Signed)
Physical Therapy Evaluation  Patient Details  Name: Gina Gibbs MRN: [id number] Date of Birth: 1946/05/13  Today's Date: 11/29/2013 Time: 1120-1205 PT Time Calculation (min): 45 min  Charge:  Evaluation.             Visit#: 1 of 8  Re-eval: 12/29/13 Assessment Diagnosis: Rt ankle fracture (ORIF) Surgical Date: 10/16/14 Next MD Visit: 12/10/2013  Authorization: [employer]     Authorization Time Period:    Authorization Visit#:   of     Past Medical History:  Past Medical History  Diagnosis Date  . Complication of anesthesia   . PONV (postoperative nausea and vomiting)   . Arthritis   . GERD (gastroesophageal reflux disease)    Past Surgical History:  Past Surgical History  Procedure Laterality Date  . Ear cyst excision  02/23/2012    Procedure: CYST REMOVAL;  Surgeon: Nicki Reaper, MD;  Location: [facility];  Service: Orthopedics;  Laterality: Left;  debridement distal interphalangeal left index finger, excision cyst/loose body    Subjective Symptoms/Limitations Symptoms: Gina Gibbs states that she fractured her ankle 10/09/2013 stepping over a pet gait.  She had surgery on 10/16/2013.  She was in a cast for two weeks and then was given a cam boot for four weeks and has been placed in an aircast this week.   The pt is full weight bearing and walking without an assistve device at this time.  How long can you stand comfortably?: Pt states she has just started standing.  The greatest she has stood has been an hour How long can you walk comfortably?: Pt is walking with her aircast and has currently walked for an hour.   Pain Assessment Currently in Pain?: No/denies Pain Score: 0-No pain Pain Location: Ankle  Precautions/Restrictions  Precautions Precautions: Other (comment) Precaution Comments: osteoporsis with several fx's   Balance Screening Balance Screen Has the patient fallen in the past 6 months: Yes How many times?: 2 Has the patient had a  decrease in activity level because of a fear of falling? : No Is the patient reluctant to leave their home because of a fear of falling? : No  Prior Functioning  Prior Function Vocation: Full time employment Vocation Requirements: sit/walk/standing at will Leisure: Hobbies-yes (Comment) Comments: ballroom dancing, walking gardening   Cognition/Observation Observation/Other Assessments Observations: area around incision is red,(pt is on anitbiotics)  Sensation/Coordination/Flexibility/Functional Tests Functional Tests Functional Tests: foto FS 50  Assessment RLE AROM (degrees) Right Ankle Dorsiflexion:  (5) Right Ankle Plantar Flexion: 50 Right Ankle Inversion: 30 Right Ankle Eversion: 18 RLE Strength Right Ankle Dorsiflexion: 4/5 Right Ankle Plantar Flexion: 3/5 Right Ankle Inversion: 3-/5 Right Ankle Eversion: 3+/5 Palpation Palpation: increased warmth at incision site.  Exercise/Treatments Mobility/Balance  Static Standing Balance Single Leg Stance - Right Leg: 6 Single Leg Stance - Left Leg: 15    Ankle Exercises - Standing SLS: 3 x 8 seconds Heel Raises: 10 reps Ankle Exercises - Supine T-Band: all x 10       Physical Therapy Assessment and Plan PT Assessment and Plan Clinical Impression Statement: Pt with hx of ORIF of Rt ankle mid November who has been referred to physical therapy.  Exam demonstrates slight decreased ROM, decreased strength and decreased proprioception.  Pt will benefit from skilled PT to improve the forementioned deficits to return pt to prior functional levle of working full  time and balllroom dancing. Pt will benefit from skilled therapeutic intervention in order to improve on the  following deficits: Difficulty walking;Decreased activity tolerance;Decreased range of motion;Pain;Decreased strength (ICD 719.7; 719.47, 719.57) PT Frequency: Min 2X/week PT Duration: 4 weeks PT Treatment/Interventions: Therapeutic activities;Therapeutic  exercise;Patient/family education;Balance training;Modalities PT Plan: Continue with high level strengthening and balance; baps board, vector stances, heel walk, toe walk...Marland Kitchen.Marland Kitchen.Marland Kitchen.    Goals Home Exercise Program Pt/caregiver will Perform Home Exercise Program: For increased strengthening PT Short Term Goals Time to Complete Short Term Goals: 2 weeks PT Short Term Goal 1: Pt to be using aircast 50% of the time only. PT Short Term Goal 2: Pt able to stand on feet for two hours for work activities. PT Long Term Goals Time to Complete Long Term Goals: 4 weeks PT Long Term Goal 1: Pt strength to be 5/5 to alllow pt to go without aircast. PT Long Term Goal 2: Pt to be able to be walking/ standing for up to three hours for work activities  Long Term Goal 3: Pt to be able begin ballroom dancing   Problem List There are no active problems to display for this patient.   PT - End of Session Activity Tolerance: Patient tolerated treatment well PT Plan of Care PT Home Exercise Plan: given  GP Functional Assessment Tool Used: foto Functional Limitation: Mobility: Walking and moving around Mobility: Walking and Moving Around Current Status ([id number](G8978): At least 40 percent but less than 60 percent impaired, limited or restricted Mobility: Walking and Moving Around Goal Status [id number]([id number]): At least 20 percent but less than 40 percent impaired, limited or restricted  Kendricks Reap,Gina Gibbs 11/29/2013, 3:17 PM  Physician Documentation Your signature is required to indicate approval of the treatment plan as stated above.  Please sign and either send electronically or make a copy of this report for your files and return this physician signed original.   Please mark one 1.__approve of plan  2. ___approve of plan with the following conditions.   ______________________________                                                          _____________________ Physician Signature                                                                                                              Date

## 2013-12-04 ENCOUNTER — Ambulatory Visit (HOSPITAL_COMMUNITY)
Admission: RE | Admit: 2013-12-04 | Discharge: 2013-12-04 | Disposition: A | Discharge status: Home / Self Care | Payer: Medicare PPO | Source: Ambulatory Visit | Attending: Pulmonary Disease | Admitting: Pulmonary Disease

## 2013-12-04 NOTE — Progress Notes (Signed)
Physical Therapy Treatment Patient Details  Name: Gina Gibbs MRN: [id number] Date of Birth: November 06, 1946  Today's Date: 12/04/2013 Time: 1024-1105 PT Time Calculation (min): 41 min Charges: Therex x 28'(1024-1052) Ice x 10'(1055-1105)  Visit#: 2 of 8  Re-eval: 12/29/13  Authorization: [employer]     Subjective: Symptoms/Limitations Symptoms: Pt states that her inscision is burning. She is taking anti-biotics secondary to redness/possible infection. Pain Assessment Currently in Pain?: Yes Pain Score: 2  Pain Location: Ankle Pain Orientation: Right  Exercise/Treatments Ankle Stretches Slant Board Stretch: 3 reps;30 seconds Ankle Exercises - Standing SLS: 20" max of 3 Heel Raises: 10 reps Toe Raise: 10 reps Heel Walk (Round Trip): 1RT Toe Walk (Round Trip): 1RT Other Standing Ankle Exercises: funcional squats x 10 Other Standing Ankle Exercises: Tandem gait 2 RT Ankle Exercises - Seated BAPS: 10 reps;Level 3;Sitting   Modalities Modalities: Cryotherapy Cryotherapy Number Minutes Cryotherapy: 10 Minutes Cryotherapy Location: Ankle (Right) Type of Cryotherapy: Ice pack  Physical Therapy Assessment and Plan PT Assessment and Plan Clinical Impression Statement: Progressed ankle strengthening activities with minimal difficulty after initial cuing and demo. Pt appears to have most difficulty with plantar flexion. Pt requires multimodal cueing to isolate ankle movement from knee with seated BAPS board activity. Ice applied at ankle at end of session to limit pain and inflammation. PT Plan: Continue to progress ankle strength and stability per PT POC.  Begin plantarflexion strengthening on leg press machine next session.    Problem List There are no active problems to display for this patient.   PT - End of Session Activity Tolerance: Patient tolerated treatment well General Behavior During Therapy: [facility] tasks assessed/performed  Seth Bakeebekah Russ Looper, PTA  12/04/2013, 2:20 PM

## 2013-12-06 ENCOUNTER — Ambulatory Visit (HOSPITAL_COMMUNITY)
Admission: RE | Admit: 2013-12-06 | Discharge: 2013-12-06 | Disposition: A | Discharge status: Home / Self Care | Payer: Medicare PPO | Source: Ambulatory Visit | Attending: Pulmonary Disease | Admitting: Pulmonary Disease

## 2013-12-06 NOTE — Progress Notes (Addendum)
Physical Therapy Treatment Patient Details  Name: Judyann MunsonDobie P Bricco MRN: [id number] Date of Birth: 10-12-1946  Today's Date: 12/06/2013 Time: 1026-1110 PT Time Calculation (min): 44 min Charge:  There ex 1026-11:00; IP 11:00-11:10 Visit#: 3 of 8  Re-eval: 12/29/13    Authorization: [employer]   Authorization Time Period: thru 01/14/2014   Subjective: Symptoms/Limitations Symptoms: Pt states that she feels like her incision has increased redness.  Pt going to MD on Monday Pain Assessment Pain Score: 2     Exercise/Treatments Mobility/Balance        Ankle Stretches Slant Board Stretch: 3 reps;30 seconds Ankle Exercises - Standing BAPS: Standing;Level 3 SLS: 20" max of 3 Rocker Board: 2 minutes Rebounder: A/P Heel Raises: 10 reps;Limitations Heel Raises Limitations: down on Rt only  Toe Raise: 10 reps Balance Beam: 2 RT Other Standing Ankle Exercises: funcional squats x 10 Ankle Exercises - Seated Other Seated Ankle Exercises: sitting dorsiflexion 2,4# x 10 Ankle Exercises - Supine   Ankle Exercises - Sidelying Ankle Inversion: Strengthening;Limitations Ankle Inversion Limitations: 2#; 4# x 10 reps  Ankle Eversion: 10 reps;Weights Ankle Eversion Weights (lbs): 2#,4# Modalities Modalities: Cryotherapy Cryotherapy Cryotherapy Location: Ankle Type of Cryotherapy: Ice pack  Physical Therapy Assessment and Plan PT Assessment and Plan Pt will benefit from skilled therapeutic intervention in order to improve on the following deficits: Difficulty walking;Decreased activity tolerance;Decreased range of motion;Pain;Decreased strength Clinical Impairments Affecting Rehab Potential: Pt began higher level standing proprioception exercises with therapist facilitation for proper technique. PT Frequency: Min 2X/week PT Duration: 4 weeks PT Treatment/Interventions: Therapeutic activities;Therapeutic exercise;Patient/family education;Balance training;Modalities PT Plan: begin  balance master B LE next visit    Goals  progressing  Problem List There are no active problems to display for this patient.   PT - End of Session Activity Tolerance: Patient tolerated treatment well  GP    Krissia Schreier,CINDY 12/06/2013, 2:20 PM

## 2013-12-11 ENCOUNTER — Ambulatory Visit (HOSPITAL_COMMUNITY)
Admission: RE | Admit: 2013-12-11 | Discharge: 2013-12-11 | Disposition: A | Discharge status: Home / Self Care | Payer: Medicare PPO | Source: Ambulatory Visit | Attending: Pulmonary Disease | Admitting: Pulmonary Disease

## 2013-12-11 NOTE — Progress Notes (Signed)
Physical Therapy Treatment Patient Details  Name: Gina MunsonDobie P Limon MRN: [id number] Date of Birth: Aug 12, 1946  Today's Date: 12/11/2013 Time: 1013-1053 PT Time Calculation (min): 40 min Charges: Therex x 38'  Visit#: 4 of 8  Re-eval: 12/29/13  Authorization: [employer]   Authorization Time Period: thru 01/14/2014   Subjective: Symptoms/Limitations Symptoms: Pt states that she was a little sore after last session. Pain Assessment Currently in Pain?: Yes Pain Score: 2  Pain Location: Foot Pain Orientation: Right  Precautions/Restrictions     Exercise/Treatments Machines for Strengthening Cybex Leg Press: 2pl PF x 10 BLE Ankle Exercises - Standing BAPS: Standing;Level 3;Limitations BAPS Limitations: 10 x R/L A/P; 5x clockwise/counterclockwise Rocker Board: 2 minutes Rebounder: A/P Heel Raises: 10 reps;Limitations Heel Raises Limitations: down on Rt only  Toe Raise: 10 reps Balance Beam: 2 RT  Physical Therapy Assessment and Plan PT Assessment and Plan Clinical Impression Statement: Pt continues to progress well. Progressed ankle strengthening activities to improve balance and stability with ambulation. Began Dentistbalance master with minimal difficulty, see scanned for printed reports. Pt plans to ice her ankle once she gets to work. Pt will benefit from skilled therapeutic intervention in order to improve on the following deficits: Difficulty walking;Decreased activity tolerance;Decreased range of motion;Pain;Decreased strength Clinical Impairments Affecting Rehab Potential: Pt began higher level standing proprioception exercises with therapist facilitation for proper technique. PT Frequency: Min 2X/week PT Duration: 4 weeks PT Treatment/Interventions: Therapeutic activities;Therapeutic exercise;Patient/family education;Balance training;Modalities PT Plan: Continue to progress ankle strength and stability per PT POC.     PT - End of Session Activity Tolerance: Patient  tolerated treatment well General Behavior During Therapy: [facility] for tasks assessed/performed  Seth Bakeebekah Janella Rogala, PTA 12/11/2013, 11:39 AM

## 2013-12-13 ENCOUNTER — Ambulatory Visit (HOSPITAL_COMMUNITY)
Admission: RE | Admit: 2013-12-13 | Discharge: 2013-12-13 | Disposition: A | Discharge status: Home / Self Care | Payer: Medicare PPO | Source: Ambulatory Visit | Attending: Pulmonary Disease | Admitting: Pulmonary Disease

## 2013-12-13 NOTE — Progress Notes (Signed)
Physical Therapy Treatment Patient Details  Name: Gina MunsonDobie P Reid MRN: [id number] Date of Birth: 07/10/1946  Today's Date: 12/13/2013 Time: 1020-1103 PT Time Calculation (min): 43 min Charges: Therex x 40'  Visit#: 5 of 8  Re-eval: 12/29/13  Authorization: [employer]   Authorization Time Period: thru 01/14/2014    Subjective: Symptoms/Limitations Symptoms: Pt states that she is not wearing her brace at all at home. Pain Assessment Currently in Pain?: No/denies  Exercise/Treatments Ankle Stretches Plantar Fascia Stretch: 3 reps;30 seconds Slant Board Stretch: 3 reps;30 seconds Machines for Strengthening Cybex Leg Press: 2pl PF x 10 BLE Ankle Exercises - Standing BAPS: Standing;Level 3;Limitations BAPS Limitations: 10 x R/L A/P; 5x clockwise/counterclockwise Rocker Board: 2 minutes Rebounder: A/P Heel Raises: 15 reps Heel Raises Limitations: down on Rt only  Toe Raise: 15 reps Balance Beam: 2 RT Other Standing Ankle Exercises: funcional squats x 10 Other Standing Ankle Exercises: Lateral step up x 10 4" ; Forward step down x 10 4"  Physical Therapy Assessment and Plan PT Assessment and Plan Clinical Impression Statement: Pt continues to progress well. Pt tolerates progressing of balance activities well. Pt displays improved control with BAPS board activity. Pt is without complaint throughout session. Pt plans to ice on her own after therapy.  Pt will benefit from skilled therapeutic intervention in order to improve on the following deficits: Difficulty walking;Decreased activity tolerance;Decreased range of motion;Pain;Decreased strength Clinical Impairments Affecting Rehab Potential: Pt began higher level standing proprioception exercises with therapist facilitation for proper technique. PT Frequency: Min 2X/week PT Duration: 4 weeks PT Treatment/Interventions: Therapeutic activities;Therapeutic exercise;Patient/family education;Balance training;Modalities PT Plan:  Continue to progress ankle strength and stability per PT POC.     Problem List There are no active problems to display for this patient.   PT - End of Session Activity Tolerance: Patient tolerated treatment well General Behavior During Therapy: [facility]WFL for tasks assessed/performed  Seth Bakeebekah Ashtynn Berke, PTA 12/13/2013, 12:39 PM

## 2013-12-18 ENCOUNTER — Ambulatory Visit (HOSPITAL_COMMUNITY)
Admission: RE | Admit: 2013-12-18 | Discharge: 2013-12-18 | Disposition: A | Discharge status: Home / Self Care | Payer: Medicare PPO | Source: Ambulatory Visit | Attending: Pulmonary Disease | Admitting: Pulmonary Disease

## 2013-12-18 NOTE — Progress Notes (Signed)
Physical Therapy Treatment Patient Details  Name: Gina MunsonDobie P Gibbs MRN: [id number] Date of Birth: 1946-04-11  Today's Date: 12/18/2013 Time: 1019-1057 PT Time Calculation (min): 38 min Charges: Therex x 38'  Visit#: 6 of 8  Re-eval: 12/29/13  Authorization: [employer]   Authorization Time Period: thru 01/14/2014   Subjective: Symptoms/Limitations Symptoms: Pt states that she feels that she is progressing very well. Pain Assessment Currently in Pain?: No/denies  Exercise/Treatments Ankle Stretches Plantar Fascia Stretch: 3 reps;30 seconds Slant Board Stretch: 3 reps;30 seconds Machines for Strengthening Cybex Leg Press: 1.5pl PF x 10 RLE only Ankle Exercises - Standing BAPS: Standing;Level 3;Limitations BAPS Limitations: x 10 each direction Rocker Board: 2 minutes Rebounder: A/P Balance Master: Limits for Stability: 1:29" BLE without HHA Balance Master: Static: 1' BLE no HHA Heel Raises: 10 reps Heel Raises Limitations: RLE only Toe Walk (Round Trip): 2RT Other Standing Ankle Exercises: Stirs 1 RT reciprocal 1 HR Other Standing Ankle Exercises: Lateral step up x 10 4" ; Forward step down x 10 4"  Physical Therapy Assessment and Plan PT Assessment and Plan Clinical Impression Statement: Pt continues to progress very well. Pt able to negotiate stairs using reciprocal pattern and one handrail. Pt displays improve control and stability with rocker board and BAPS board. Increased ankle mobility noted as well.  Pt will benefit from skilled therapeutic intervention in order to improve on the following deficits: Difficulty walking;Decreased activity tolerance;Decreased range of motion;Pain;Decreased strength Clinical Impairments Affecting Rehab Potential: Pt began higher level standing proprioception exercises with therapist facilitation for proper technique. PT Frequency: Min 2X/week PT Duration: 4 weeks PT Treatment/Interventions: Therapeutic activities;Therapeutic  exercise;Patient/family education;Balance training;Modalities PT Plan: Continue to progress ankle strength and stability per PT POC.    Problem List There are no active problems to display for this patient.   PT - End of Session Activity Tolerance: Patient tolerated treatment well General Behavior During Therapy: [facility] for tasks assessed/performed   Seth Bakeebekah Aniyha Tate, PTA  12/18/2013, 11:08 AM

## 2013-12-20 ENCOUNTER — Ambulatory Visit (HOSPITAL_COMMUNITY)
Admission: RE | Admit: 2013-12-20 | Discharge: 2013-12-20 | Disposition: A | Discharge status: Home / Self Care | Payer: Medicare PPO | Source: Ambulatory Visit | Attending: Pulmonary Disease | Admitting: Pulmonary Disease

## 2013-12-20 NOTE — Evaluation (Signed)
Physical Therapy Discharge Summary  Patient Details  Name: Gina Gibbs MRN: [id number] Date of Birth: [birth year]  Today's Date: 12/20/2013 Time: 1018-1050 PT Time Calculation (min): 32 min Charges: MMT/ROMM x 1 Self care x 10'              Visit#: 7 of 8  Re-eval: 12/29/13 Assessment Diagnosis: Rt ankle fracture (ORIF) Surgical Date: 10/16/14 Next MD Visit: 12/31/13  Authorization: [employer]     Authorization Time Period: thru 01/14/2014   Past Medical History:  Past Medical History  Diagnosis Date  . Complication of anesthesia   . PONV (postoperative nausea and vomiting)   . Arthritis   . GERD (gastroesophageal reflux disease)    Past Surgical History:  Past Surgical History  Procedure Laterality Date  . Ear cyst excision  02/23/2012    Procedure: CYST REMOVAL;  Surgeon: Wynonia Sours, MD;  Location: [facility];  Service: Orthopedics;  Laterality: Left;  debridement distal interphalangeal left index finger, excision cyst/loose body    Subjective Symptoms/Limitations Symptoms: Pt states that she feels ready for discharge. Pain Assessment Currently in Pain?: No/denies  Sensation/Coordination/Flexibility/Functional Tests Functional Tests Functional Tests: foto FS 71 (was 50 (11/29/13))  Assessment RLE AROM (degrees) Right Ankle Dorsiflexion: 10 (was 5 (11/29/13)) Right Ankle Plantar Flexion: 65 (was 50 (11/29/13)) Right Ankle Inversion: 45 (was 45 (11/29/13)) Right Ankle Eversion: 22 (was 18 (11/29/13)) RLE Strength Right Ankle Dorsiflexion: 5/5 (was 5/5 (11/29/13)) Right Ankle Plantar Flexion: 5/5 (was 3/5 (11/29/13)) Right Ankle Inversion: 5/5 (was 3-/5 (11/29/13)) Right Ankle Eversion: 5/5 (was 3+/5 (11/29/13))  Exercise/Treatments Mobility/Balance  Static Standing Balance Single Leg Stance - Right Leg: 25 (was 6 (11/29/13))    Physical Therapy Assessment and Plan PT Assessment and Plan Clinical Impression Statement: Pt has progressed well with therapy.  Pt displays significant improvements in balance, strength and ROM. Skin integrity at incision in WNL. Pt is independent with HEP and comfortable with D/C. Pt will benefit from skilled therapeutic intervention in order to improve on the following deficits: Difficulty walking;Decreased activity tolerance;Decreased range of motion;Pain;Decreased strength Clinical Impairments Affecting Rehab Potential: Pt began higher level standing proprioception exercises with therapist facilitation for proper technique. PT Frequency: Min 2X/week PT Duration: 4 weeks PT Treatment/Interventions: Therapeutic activities;Therapeutic exercise;Patient/family education;Balance training;Modalities PT Plan: Recommend D/C to HEP.    Goals Home Exercise Program Pt/caregiver will Perform Home Exercise Program: For increased strengthening PT Short Term Goals Time to Complete Short Term Goals: 2 weeks PT Short Term Goal 1: Pt to be using aircast 50% of the time only. PT Short Term Goal 1 - Progress: Met PT Short Term Goal 2: Pt able to stand on feet for two hours for work activities. PT Short Term Goal 2 - Progress: Met PT Long Term Goals Time to Complete Long Term Goals: 4 weeks PT Long Term Goal 1: Pt strength to be 5/5 to alllow pt to go without aircast. PT Long Term Goal 1 - Progress: Partly met PT Long Term Goal 2: Pt to be able to be walking/ standing for up to three hours for work activities  PT Long Term Goal 2 - Progress: Met Long Term Goal 3: Pt to be able begin ballroom dancing  Long Term Goal 3 Progress: Progressing toward goal  Problem List There are no active problems to display for this patient.   PT - End of Session Activity Tolerance: Patient tolerated treatment well General Behavior During Therapy: WFL for tasks assessed/performed  GP Functional Assessment  Tool Used: FOTO Functional Limitation: Mobility: Walking and moving around Mobility: Walking and Moving Around Goal Status [phone]): At  least 20 percent but less than 40 percent impaired, limited or restricted Mobility: Walking and Moving Around Discharge Status [phone]): At least 20 percent but less than 40 percent impaired, limited or restricted  Rachelle Hora, PTA 12/20/2013, 12:09 PM  Physician Documentation Your signature is required to indicate approval of the treatment plan as stated above.  Please sign and either send electronically or make a copy of this report for your files and return this physician signed original.   Please mark one 1.__approve of plan  2. ___approve of plan with the following conditions.   ______________________________                                                          _____________________ Physician Signature                                                                                                             Date

## 2013-12-25 ENCOUNTER — Ambulatory Visit (HOSPITAL_COMMUNITY): Payer: Medicare PPO | Admitting: Physical Therapy

## 2014-03-26 ENCOUNTER — Other Ambulatory Visit (HOSPITAL_COMMUNITY): Payer: Self-pay | Admitting: Pulmonary Disease

## 2014-03-26 DIAGNOSIS — Z780 Asymptomatic menopausal state: Secondary | ICD-10-CM

## 2014-04-01 ENCOUNTER — Ambulatory Visit (HOSPITAL_COMMUNITY)
Admission: RE | Admit: 2014-04-01 | Discharge: 2014-04-01 | Disposition: A | Discharge status: Home / Self Care | Payer: Medicare PPO | Source: Ambulatory Visit | Attending: Pulmonary Disease | Admitting: Pulmonary Disease

## 2014-04-01 DIAGNOSIS — Z780 Asymptomatic menopausal state: Secondary | ICD-10-CM | POA: Insufficient documentation

## 2014-04-01 DIAGNOSIS — M810 Age-related osteoporosis without current pathological fracture: Secondary | ICD-10-CM | POA: Insufficient documentation

## 2014-04-01 LAB — HM DEXA SCAN

## 2014-05-30 ENCOUNTER — Encounter (HOSPITAL_COMMUNITY)
Admission: RE | Admit: 2014-05-30 | Discharge: 2014-05-30 | Disposition: A | Discharge status: Home / Self Care | Payer: Medicare PPO | Source: Ambulatory Visit | Attending: Pulmonary Disease | Admitting: Pulmonary Disease

## 2014-05-30 ENCOUNTER — Encounter (HOSPITAL_COMMUNITY): Payer: Self-pay

## 2014-05-30 DIAGNOSIS — M810 Age-related osteoporosis without current pathological fracture: Secondary | ICD-10-CM | POA: Insufficient documentation

## 2014-05-30 LAB — COMPREHENSIVE METABOLIC PANEL
ALBUMIN: 3.9 g/dL (ref 3.5–5.2)
ALT: 15 U/L (ref 0–35)
AST: 16 U/L (ref 0–37)
Alkaline Phosphatase: 71 U/L (ref 39–117)
Anion gap: 11 (ref 5–15)
BILIRUBIN TOTAL: 1 mg/dL (ref 0.3–1.2)
BUN: 14 mg/dL (ref 6–23)
CALCIUM: 9.5 mg/dL (ref 8.4–10.5)
CHLORIDE: 105 meq/L (ref 96–112)
CO2: 27 mEq/L (ref 19–32)
CREATININE: 0.69 mg/dL (ref 0.50–1.10)
GFR calc Af Amer: >90 mL/min (ref >90)
GFR calc non Af Amer: 89 mL/min — ABNORMAL LOW (ref >90)
Glucose, Bld: 89 mg/dL (ref 70–99)
Potassium: 3.8 mEq/L (ref 3.7–5.3)
SODIUM: 143 meq/L (ref 137–147)
Total Protein: 7 g/dL (ref 6.0–8.3)

## 2014-05-30 LAB — MAGNESIUM: Magnesium: 2 mg/dL (ref 1.5–2.5)

## 2014-05-30 LAB — PHOSPHORUS: PHOSPHORUS: 3.1 mg/dL (ref 2.3–4.6)

## 2014-05-30 MED ORDER — ZOLEDRONIC ACID 5 MG/100ML IV SOLN
5.0000 mg | Freq: Once | INTRAVENOUS | Status: AC
  Administered 2014-05-30: 5 mg via INTRAVENOUS
  Filled 2014-05-30: qty 100

## 2014-05-30 MED ORDER — SODIUM CHLORIDE 0.9 % IV SOLN
INTRAVENOUS | Status: DC
Start: 2014-05-30 — End: 2014-05-31
  Administered 2014-05-30: 250 mL via INTRAVENOUS

## 2014-05-30 NOTE — Discharge Instructions (Signed)

## 2014-05-30 NOTE — Progress Notes (Signed)
Results for Gina Gibbs, Gina Gibbs (MRN [id number]) as of 05/30/2014 11:00  Labs prior to Reclast infusion. Tolerated well.  Appointment made for 06/02/2015 for her annual dose.  Ref. Range 05/30/2014 09:33  Sodium Latest Range: 137-147 mEq/L 143  Potassium Latest Range: 3.7-5.3 mEq/L 3.8  Chloride Latest Range: 96-112 mEq/L 105  CO2 Latest Range: 19-32 mEq/L 27  BUN Latest Range: 6-23 mg/dL 14  Creatinine Latest Range: 0.50-1.10 mg/dL 4.090.69  Calcium Latest Range: 8.4-10.5 mg/dL 9.5  GFR calc non Af Amer Latest Range: >90 mL/min 89 (L)  GFR calc Af Amer Latest Range: >90 mL/min >90  Glucose Latest Range: 70-99 mg/dL 89  Anion gap Latest Range: 5-15  11  Phosphorus Latest Range: 2.3-4.6 mg/dL 3.1  Magnesium Latest Range: 1.5-2.5 mg/dL 2.0  Alkaline Phosphatase Latest Range: 39-117 U/L 71  Albumin Latest Range: 3.5-5.2 g/dL 3.9  AST Latest Range: 0-37 U/L 16  ALT Latest Range: 0-35 U/L 15  Total Protein Latest Range: 6.0-8.3 g/dL 7.0  Total Bilirubin Latest Range: 0.3-1.2 mg/dL 1.0

## 2014-07-11 ENCOUNTER — Other Ambulatory Visit (HOSPITAL_COMMUNITY): Payer: Self-pay | Admitting: General Surgery

## 2014-07-11 DIAGNOSIS — Z1231 Encounter for screening mammogram for malignant neoplasm of breast: Secondary | ICD-10-CM

## 2014-07-17 ENCOUNTER — Ambulatory Visit (HOSPITAL_COMMUNITY)
Admission: RE | Admit: 2014-07-17 | Discharge: 2014-07-17 | Disposition: A | Discharge status: Home / Self Care | Payer: Medicare PPO | Source: Ambulatory Visit | Attending: General Surgery | Admitting: General Surgery

## 2014-07-17 DIAGNOSIS — Z1231 Encounter for screening mammogram for malignant neoplasm of breast: Secondary | ICD-10-CM

## 2014-12-17 ENCOUNTER — Ambulatory Visit (HOSPITAL_COMMUNITY)
Admission: RE | Admit: 2014-12-17 | Discharge: 2014-12-17 | Disposition: A | Discharge status: Home / Self Care | Payer: Medicare PPO | Source: Ambulatory Visit | Attending: Pulmonary Disease | Admitting: Pulmonary Disease

## 2014-12-17 ENCOUNTER — Other Ambulatory Visit (HOSPITAL_COMMUNITY): Payer: Self-pay | Admitting: Pulmonary Disease

## 2014-12-17 DIAGNOSIS — R059 Cough, unspecified: Secondary | ICD-10-CM

## 2014-12-17 DIAGNOSIS — R05 Cough: Secondary | ICD-10-CM

## 2015-05-02 DIAGNOSIS — M1990 Unspecified osteoarthritis, unspecified site: Secondary | ICD-10-CM | POA: Diagnosis not present

## 2015-05-02 DIAGNOSIS — M7071 Other bursitis of hip, right hip: Secondary | ICD-10-CM | POA: Diagnosis not present

## 2015-06-02 ENCOUNTER — Encounter (HOSPITAL_COMMUNITY)
Admission: RE | Admit: 2015-06-02 | Discharge: 2015-06-02 | Disposition: A | Discharge status: Home / Self Care | Payer: Medicare PPO | Source: Ambulatory Visit | Attending: Pulmonary Disease | Admitting: Pulmonary Disease

## 2015-06-02 DIAGNOSIS — M810 Age-related osteoporosis without current pathological fracture: Secondary | ICD-10-CM | POA: Diagnosis not present

## 2015-06-02 LAB — COMPREHENSIVE METABOLIC PANEL
ALBUMIN: 4.1 g/dL (ref 3.5–5.0)
ALT: 19 U/L (ref 14–54)
ANION GAP: 7 (ref 5–15)
AST: 18 U/L (ref 15–41)
Alkaline Phosphatase: 53 U/L (ref 38–126)
BUN: 23 mg/dL — ABNORMAL HIGH (ref 6–20)
CO2: 28 mmol/L (ref 22–32)
CREATININE: 0.78 mg/dL (ref 0.44–1.00)
Calcium: 9 mg/dL (ref 8.9–10.3)
Chloride: 105 mmol/L (ref 101–111)
GFR calc Af Amer: >60 mL/min (ref >60)
Glucose, Bld: 77 mg/dL (ref 65–99)
Potassium: 4 mmol/L (ref 3.5–5.1)
Sodium: 140 mmol/L (ref 135–145)
Total Bilirubin: 1 mg/dL (ref 0.3–1.2)
Total Protein: 6.4 g/dL — ABNORMAL LOW (ref 6.5–8.1)

## 2015-06-02 LAB — PHOSPHORUS: Phosphorus: 3.7 mg/dL (ref 2.5–4.6)

## 2015-06-02 LAB — MAGNESIUM: Magnesium: 1.9 mg/dL (ref 1.7–2.4)

## 2015-06-02 MED ORDER — SODIUM CHLORIDE 0.9 % IV SOLN
INTRAVENOUS | Status: DC
  Administered 2015-06-02: 10:00:00 via INTRAVENOUS

## 2015-06-02 MED ORDER — ZOLEDRONIC ACID 5 MG/100ML IV SOLN
5.0000 mg | Freq: Once | INTRAVENOUS | Status: AC
  Administered 2015-06-02: 5 mg via INTRAVENOUS
  Filled 2015-06-02: qty 100

## 2015-06-02 NOTE — Discharge Instructions (Signed)

## 2015-06-03 NOTE — Progress Notes (Signed)
Results for Gina Gibbs, Gina Gibbs (MRN [id number]) as of 06/03/2015 07:53  Ref. Range 06/02/2015 09:43  Sodium Latest Ref Range: 135-145 mmol/L 140  Potassium Latest Ref Range: 3.5-5.1 mmol/L 4.0  Chloride Latest Ref Range: 101-111 mmol/L 105  CO2 Latest Ref Range: 22-32 mmol/L 28  BUN Latest Ref Range: 6-20 mg/dL 23 (H)  Creatinine Latest Ref Range: 0.44-1.00 mg/dL 0.78  Calcium Latest Ref Range: 8.9-10.3 mg/dL 9.0  EGFR (Non-African Amer.) Latest Ref Range: >60 mL/min >60  EGFR (African American) Latest Ref Range: >60 mL/min >60  Glucose Latest Ref Range: 65-99 mg/dL 77  Anion gap Latest Ref Range: 5-15  7  Phosphorus Latest Ref Range: 2.5-4.6 mg/dL 3.7  Magnesium Latest Ref Range: 1.7-2.4 mg/dL 1.9  Alkaline Phosphatase Latest Ref Range: 38-126 U/L 53  Albumin Latest Ref Range: 3.5-5.0 g/dL 4.1  AST Latest Ref Range: 15-41 U/L 18  ALT Latest Ref Range: 14-54 U/L 19  Total Protein Latest Ref Range: 6.5-8.1 g/dL 6.4 (L)  Total Bilirubin Latest Ref Range: 0.3-1.2 mg/dL 1.0   Reclast 45m IV given.

## 2015-07-24 DIAGNOSIS — M25552 Pain in left hip: Secondary | ICD-10-CM | POA: Diagnosis not present

## 2015-08-19 DIAGNOSIS — Z23 Encounter for immunization: Secondary | ICD-10-CM | POA: Diagnosis not present

## 2015-08-19 DIAGNOSIS — Z0000 Encounter for general adult medical examination without abnormal findings: Secondary | ICD-10-CM | POA: Diagnosis not present

## 2015-08-21 ENCOUNTER — Encounter (INDEPENDENT_AMBULATORY_CARE_PROVIDER_SITE_OTHER): Payer: Self-pay | Admitting: *Deleted

## 2015-08-28 ENCOUNTER — Other Ambulatory Visit (HOSPITAL_COMMUNITY): Payer: Self-pay | Admitting: Pulmonary Disease

## 2015-08-28 DIAGNOSIS — Z1231 Encounter for screening mammogram for malignant neoplasm of breast: Secondary | ICD-10-CM

## 2015-09-01 ENCOUNTER — Other Ambulatory Visit (HOSPITAL_COMMUNITY): Payer: Self-pay | Admitting: Pulmonary Disease

## 2015-09-01 DIAGNOSIS — M5410 Radiculopathy, site unspecified: Secondary | ICD-10-CM

## 2015-09-05 DIAGNOSIS — Z1211 Encounter for screening for malignant neoplasm of colon: Secondary | ICD-10-CM | POA: Diagnosis not present

## 2015-09-10 ENCOUNTER — Ambulatory Visit (HOSPITAL_COMMUNITY)
Admission: RE | Admit: 2015-09-10 | Discharge: 2015-09-10 | Disposition: A | Discharge status: Home / Self Care | Payer: Medicare PPO | Source: Ambulatory Visit | Attending: Pulmonary Disease | Admitting: Pulmonary Disease

## 2015-09-10 ENCOUNTER — Other Ambulatory Visit: Payer: Self-pay | Admitting: Pulmonary Disease

## 2015-09-10 DIAGNOSIS — M5410 Radiculopathy, site unspecified: Secondary | ICD-10-CM

## 2015-09-10 DIAGNOSIS — Z1231 Encounter for screening mammogram for malignant neoplasm of breast: Secondary | ICD-10-CM | POA: Diagnosis not present

## 2015-09-12 ENCOUNTER — Other Ambulatory Visit: Payer: Medicare PPO

## 2015-09-18 ENCOUNTER — Ambulatory Visit
Admission: RE | Admit: 2015-09-18 | Discharge: 2015-09-18 | Disposition: A | Discharge status: Home / Self Care | Payer: Medicare PPO | Source: Ambulatory Visit | Attending: Pulmonary Disease | Admitting: Pulmonary Disease

## 2015-09-18 ENCOUNTER — Other Ambulatory Visit (HOSPITAL_COMMUNITY): Payer: Medicare PPO

## 2015-09-18 DIAGNOSIS — M5410 Radiculopathy, site unspecified: Secondary | ICD-10-CM

## 2015-09-18 DIAGNOSIS — M5126 Other intervertebral disc displacement, lumbar region: Secondary | ICD-10-CM | POA: Diagnosis not present

## 2015-09-30 DIAGNOSIS — M7062 Trochanteric bursitis, left hip: Secondary | ICD-10-CM | POA: Diagnosis not present

## 2015-09-30 DIAGNOSIS — M545 Low back pain: Secondary | ICD-10-CM | POA: Diagnosis not present

## 2015-09-30 DIAGNOSIS — M4806 Spinal stenosis, lumbar region: Secondary | ICD-10-CM | POA: Diagnosis not present

## 2015-10-02 DIAGNOSIS — M7062 Trochanteric bursitis, left hip: Secondary | ICD-10-CM | POA: Diagnosis not present

## 2015-10-02 DIAGNOSIS — M4806 Spinal stenosis, lumbar region: Secondary | ICD-10-CM | POA: Diagnosis not present

## 2015-10-02 DIAGNOSIS — M545 Low back pain: Secondary | ICD-10-CM | POA: Diagnosis not present

## 2015-10-20 DIAGNOSIS — M4806 Spinal stenosis, lumbar region: Secondary | ICD-10-CM | POA: Diagnosis not present

## 2015-10-20 DIAGNOSIS — M545 Low back pain: Secondary | ICD-10-CM | POA: Diagnosis not present

## 2015-10-23 ENCOUNTER — Ambulatory Visit (HOSPITAL_COMMUNITY): Payer: Medicare PPO | Attending: Physical Medicine and Rehabilitation | Admitting: Physical Therapy

## 2015-10-23 DIAGNOSIS — M533 Sacrococcygeal disorders, not elsewhere classified: Secondary | ICD-10-CM | POA: Insufficient documentation

## 2015-10-23 DIAGNOSIS — R29898 Other symptoms and signs involving the musculoskeletal system: Secondary | ICD-10-CM

## 2015-10-23 DIAGNOSIS — M791 Myalgia: Secondary | ICD-10-CM | POA: Insufficient documentation

## 2015-10-23 DIAGNOSIS — R262 Difficulty in walking, not elsewhere classified: Secondary | ICD-10-CM

## 2015-10-23 DIAGNOSIS — M545 Low back pain: Secondary | ICD-10-CM | POA: Diagnosis not present

## 2015-10-23 DIAGNOSIS — M7918 Myalgia, other site: Secondary | ICD-10-CM

## 2015-10-23 NOTE — Therapy (Signed)
[facility] [address] [place], [place], [address] Phone: [phone]   Fax:  [phone]  Physical Therapy Evaluation  Patient Details  Name: Gina Gibbs MRN: [id number] Date of Birth: Aug 27, 1946 Referring Provider: Romero Belling  Encounter Date: 10/23/2015      PT End of Session - 10/23/15 1201    Visit Number 1   Number of Visits 10   Date for PT Re-Evaluation 11/21/15   Authorization Type Medicare   Authorization Time Period 10/23/15-12/24/15   Authorization - Visit Number 1   Authorization - Number of Visits 10   PT Start Time 0845   PT Stop Time 0925   PT Time Calculation (min) 40 min   Activity Tolerance Patient tolerated treatment well   Behavior During Therapy [facility] for tasks assessed/performed      Past Medical History  Diagnosis Date  . Complication of anesthesia   . PONV (postoperative nausea and vomiting)   . Arthritis   . GERD (gastroesophageal reflux disease)     Past Surgical History  Procedure Laterality Date  . Ear cyst excision  02/23/2012    Procedure: CYST REMOVAL;  Surgeon: Nicki Reaper, MD;  Location: [facility];  Service: Orthopedics;  Laterality: Left;  debridement distal interphalangeal left index finger, excision cyst/loose body  . Fracture surgery  09/2013    right ankle    There were no vitals filed for this visit.  Visit Diagnosis:  Left low back pain, with sciatica presence unspecified  Piriformis muscle pain  Sacroiliac joint dysfunction  Weakness of both legs  Difficulty walking      Subjective Assessment - 10/23/15 0853    Subjective Pt reports that she has been having LBP since August. She reports that she got up one morning and stepped to one side and started having back pain. The pain is mainly on the left side of her low back. When the pain originally started, she got an injection in her hip, which helped a bit with the pain, but did no resolve the pain. She  received another injection a couple of weeks ago in her spine, which helped somewhat. Pt reports that her L foot and leg have been going numb, and she has had difficulty sleeping since the onset of pain. The numbness begins in her L foot, and travels up her leg to her knee. She reports that the pain is mainly localized to her buttock.   How long can you sit comfortably? no limitations   How long can you stand comfortably? varies, sometimes <5 minutes   How long can you walk comfortably? 20-30 minutes   Patient Stated Goals Get rid of pain, be able to walk/shop without onset of pain   Currently in Pain? Yes   Pain Score 2    Pain Location Buttocks   Pain Orientation Left   Pain Descriptors / Indicators Dull   Aggravating Factors  carrying objects in front of body            [facility] PT Assessment - 10/23/15 0001    Assessment   Medical Diagnosis L LBP   Referring Provider Romero Belling   Onset Date/Surgical Date 06/26/15  approximate date   Next MD Visit 11/11/15   Prior Therapy no   Precautions   Precautions None   Restrictions   Weight Bearing Restrictions No   Balance Screen   Has the patient fallen in the past 6 months No   Has the  patient had a decrease in activity level because of a fear of falling?  No   Is the patient reluctant to leave their home because of a fear of falling?  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 around, lift heavy objects   Leisure shopping, ballroom dancing   Observation/Other Assessments   Focus on Therapeutic Outcomes (FOTO)  56%  limited   ROM / Strength   AROM / PROM / Strength AROM;Strength   AROM   AROM Assessment Site Lumbar;Hip   Right Hip External Rotation  31   Right Hip Internal Rotation  46   Left Hip External Rotation  34   Left Hip Internal Rotation  44   Lumbar Flexion 58   Lumbar Extension 30   Lumbar - Right Side Bend 35   Lumbar - Left Side Bend 25   Strength   Strength Assessment Site Hip;Knee   Right Hip Flexion 4-/5   Right Hip Extension 3/5  pain in SI joint   Right Hip ABduction 4/5   Left Hip Flexion 4-/5   Left Hip Extension 3+/5   Left Hip ABduction 3/5   Right/Left Knee Right;Left   Right Knee Flexion 4+/5   Right Knee Extension 4+/5   Left Knee Flexion 4+/5   Left Knee Extension 4+/5   Special Tests    Special Tests Sacrolliac Tests;Lumbar   Lumbar Tests FABER test   Sacroiliac Tests  Pelvic Distraction   FABER test   findings Positive   Side LEft   Pelvic Dictraction   Findings Positive   Side  Left   Transfers   Five time sit to stand comments  18.24   Ambulation/Gait   Gait Pattern Decreased stance time - left                  PT Education - 10/23/15 1153    Education provided Yes   Education Details HEP, prognosis   Person(s) Educated Patient   Methods Explanation;Handout   Comprehension Verbalized understanding;Returned demonstration          PT Short Term Goals - 10/23/15 1209    PT SHORT TERM GOAL #1   Title Pt will be independent with HEP.   Time 2   Period Weeks   Status New   PT SHORT TERM GOAL #2   Title Improve BLE strength and power evidenced by five time sit to stand time of 15 seconds or less.    Time 2   Period Weeks   Status New   PT SHORT TERM GOAL #3   Title Improve bilateral hip extensor strength to 4-/5 or greater to improve ability to ascend/descend stairs.    Time 2   Period Weeks   Status New           PT Long Term Goals - 10/23/15 1213    PT LONG TERM GOAL #1   Title Pt will be independent with advanced  HEP.   Time 4   Period Weeks   Status New   PT  LONG TERM GOAL #2   Title Pt will complete five time sit to stand in 12 seconds or less to demonstrate improved BLE power.    Time 4   Period Weeks   Status New   PT LONG TERM GOAL #3   Title Restore full and painfree lumbar AROM to allow pt to complete functional tasks without pain.   Time 4   Period Weeks   Status New   PT LONG TERM GOAL #4   Title Pt will report having full night's sleep without being woken from pain in buttock/back.    Time 4   Period Weeks   Status New   PT LONG TERM GOAL #5   Title Improve hip extension and abduction strength to 4/5 or greater to improve gait mechanics.    Time 4   Period Weeks               Plan - 16-Nov-2015 1202-04-14    Clinical Impression Statement Pt presents to PT with c/o L buttock pain and numbness in L foot/calf. Upon examination, pt demonstrates decreased strength in BLE, decreased lumbar ROM, impaired SI joint mobility, tightness and trigger points in L piriformis, and decreased functional activity tolerance. Pt will benefit from skilled PT services at this time to address her impairments in order to return pt to PLOF.    Pt will benefit from skilled therapeutic intervention in order to improve on the following deficits Decreased activity tolerance;Decreased range of motion;Decreased strength;Pain;Difficulty walking;Increased muscle spasms   Rehab Potential Good   PT Frequency 2x / week   PT Duration 4 weeks   PT Treatment/Interventions ADLs/Self Care Home Management;Moist Heat;Gait training;Therapeutic activities;Therapeutic exercise;Balance training;Neuromuscular re-education;Patient/family education;Manual techniques;Passive range of motion   PT Next Visit Plan Check SI alignment, manual to L piriformis, begin core stablization          G-Codes - 11-16-15 Apr 14, 1217    Functional Assessment Tool Used FOTO   Functional Limitation Mobility: Walking and moving around   Mobility:  Walking and Moving Around Current Status [phone]) At least 40 percent but less than 60 percent impaired, limited or restricted   Mobility: Walking and Moving Around Goal Status [phone]) At least 40 percent but less than 60 percent impaired, limited or restricted       Problem List There are no active problems to display for this patient.   Leona Singleton, PT, DPT [phone] 16-Nov-2015, 12:19 PM  [place] [facility] [address] [place], [place], [address] Phone: [phone]   Fax:  [phone]  Name: Gina Gibbs MRN: [id number] Date of Birth: [birth year]

## 2015-10-23 NOTE — Patient Instructions (Signed)
Piriformis Stretch - Supine    Pull uninvolved knee across body toward opposite shoulder. Hold slight stretch for ___ seconds. Repeat with involved leg. Repeat ___ times. Do ___ times per day.  Copyright  VHI. All rights reserved.  Abduction: Clam (Eccentric) - Side-Lying    Lie on side with knees bent. Lift top knee, keeping feet together. Keep trunk steady.  ___ reps per set, ___ sets per day, ___ days per week.   http://ecce.exer.us/65   Copyright  VHI. All rights reserved.

## 2015-10-27 ENCOUNTER — Ambulatory Visit (HOSPITAL_COMMUNITY): Payer: Medicare PPO | Admitting: Physical Therapy

## 2015-10-27 DIAGNOSIS — R262 Difficulty in walking, not elsewhere classified: Secondary | ICD-10-CM | POA: Diagnosis not present

## 2015-10-27 DIAGNOSIS — M533 Sacrococcygeal disorders, not elsewhere classified: Secondary | ICD-10-CM

## 2015-10-27 DIAGNOSIS — M545 Low back pain: Secondary | ICD-10-CM

## 2015-10-27 DIAGNOSIS — R29898 Other symptoms and signs involving the musculoskeletal system: Secondary | ICD-10-CM

## 2015-10-27 DIAGNOSIS — M791 Myalgia: Secondary | ICD-10-CM | POA: Diagnosis not present

## 2015-10-27 DIAGNOSIS — M7918 Myalgia, other site: Secondary | ICD-10-CM

## 2015-10-27 NOTE — Therapy (Signed)
[facility] [address] [place], [place], [address] Phone: [phone]   Fax:  [phone]  Physical Therapy Treatment  Patient Details  Name: Gina Gibbs MRN: [id number] Date of Birth: March 07, 1946 Referring Provider: Romero BellingWesley Ibazebo  Encounter Date: 10/27/2015      PT End of Session - 10/27/15 1325    Visit Number 2   Number of Visits 10   Date for PT Re-Evaluation 11/21/15   Authorization Type Medicare   Authorization Time Period 10/23/15-12/24/15   Authorization - Visit Number 2   Authorization - Number of Visits 10   PT Start Time 1100   PT Stop Time 1144   PT Time Calculation (min) 44 min   Activity Tolerance Patient tolerated treatment well   Behavior [facility] for tasks assessed/performed      Past Medical History  Diagnosis Date  . Complication of anesthesia   . PONV (postoperative nausea and vomiting)   . Arthritis   . GERD (gastroesophageal reflux disease)     Past Surgical History  Procedure Laterality Date  . Ear cyst excision  02/23/2012    Procedure: CYST REMOVAL;  Surgeon: Nicki ReaperGary R Kuzma, MD;  Location: [facility];  Service: Orthopedics;  Laterality: Left;  debridement distal interphalangeal left index finger, excision cyst/loose body  . Fracture surgery  09/2013    right ankle    There were no vitals filed for this visit.  Visit Diagnosis:  Left low back pain, with sciatica presence unspecified  Piriformis muscle pain  Sacroiliac joint dysfunction  Weakness of both legs      Subjective Assessment - 10/27/15 1104    Subjective Pt reports that she had a lot of pain and numbness after the initial evaluation the other day. The pain has since subsided, and she reports that she has no pain today.    Currently in Pain? No/denies   Pain Score 0-No pain                         [facility] Adult PT Treatment/Exercise - 10/27/15 0001    Exercises   Exercises  Knee/Hip;Lumbar   Lumbar Exercises: Supine   Ab Set 15 reps;3 seconds   Bent Knee Raise 10 reps   Bridge 15 reps   Lumbar Exercises: Sidelying   Clam 15 reps   Lumbar Exercises: Prone   Other Prone Lumbar Exercises prone heel squeeze 3" x 15   Knee/Hip Exercises: Stretches   Active Hamstring Stretch 3 reps;30 seconds   Active Hamstring Stretch Limitations 12" step   Piriformis Stretch 3 reps;30 seconds   Piriformis Stretch Limitations supine   Knee/Hip Exercises: Standing   Hip Abduction 10 reps;Both   Abduction Limitations with ER and extension   Lateral Step Up 10 reps;Step Height: 4";Both   Knee/Hip Exercises: Sidelying   Clams --   Manual Therapy   Manual Therapy Soft tissue mobilization;Joint mobilization   Manual therapy comments performed prior to therex   Joint Mobilization grade I sacral L rotation mobs   Soft tissue mobilization to L piriformis in sidelying                  PT Short Term Goals - 10/23/15 1209    PT SHORT TERM GOAL #1   Title Pt will be independent with HEP.   Time 2   Period Weeks   Status New   PT SHORT TERM GOAL #2   Title  Improve BLE strength and power evidenced by five time sit to stand time of 15 seconds or less.    Time 2   Period Weeks   Status New   PT SHORT TERM GOAL #3   Title Improve bilateral hip extensor strength to 4-/5 or greater to improve ability to ascend/descend stairs.    Time 2   Period Weeks   Status New           PT Long Term Goals - 10/23/15 1213    PT LONG TERM GOAL #1   Title Pt will be independent with advanced HEP.   Time 4   Period Weeks   Status New   PT LONG TERM GOAL #2   Title Pt will complete five time sit to stand in 12 seconds or less to demonstrate improved BLE power.    Time 4   Period Weeks   Status New   PT LONG TERM GOAL #3   Title Restore full and painfree lumbar AROM to allow pt to complete functional tasks without pain.   Time 4   Period Weeks   Status New   PT LONG TERM  GOAL #4   Title Pt will report having full night's sleep without being woken from pain in buttock/back.    Time 4   Period Weeks   Status New   PT LONG TERM GOAL #5   Title Improve hip extension and abduction strength to 4/5 or greater to improve gait mechanics.    Time 4   Period Weeks               Plan - 10/27/15 1326    Clinical Impression Statement Treatment session focused on core and pelvic stabilization and manual therapy. ASIS and PSIS even in today's treatment, sacrum rotated slightly to the R today which was corrected with manual therapy. Pt was able to complete all therex with no c/o pain or radicular symptoms. Pt reported that she did experience some numbness in her foot upon standing that resolved after a few minutes. Pt denied any increased pain post treatment.    PT Next Visit Plan Check SI alignment, continue with manual therapy to L piriformis, progress hip and core strengthening        Problem List There are no active problems to display for this patient.   Leona Singleton, PT, DPT [phone] 10/27/2015, 1:31 PM  [facility] [address] [place], [place], [address] Phone: [phone]   Fax:  [phone]  Name: Gina Gibbs MRN: [id number] Date of Birth: Oct 05, 1946

## 2015-10-29 ENCOUNTER — Ambulatory Visit (HOSPITAL_COMMUNITY): Payer: Medicare PPO

## 2015-10-29 DIAGNOSIS — M545 Low back pain: Secondary | ICD-10-CM

## 2015-10-29 DIAGNOSIS — R29898 Other symptoms and signs involving the musculoskeletal system: Secondary | ICD-10-CM

## 2015-10-29 DIAGNOSIS — M791 Myalgia: Secondary | ICD-10-CM | POA: Diagnosis not present

## 2015-10-29 DIAGNOSIS — M533 Sacrococcygeal disorders, not elsewhere classified: Secondary | ICD-10-CM

## 2015-10-29 DIAGNOSIS — M7918 Myalgia, other site: Secondary | ICD-10-CM

## 2015-10-29 DIAGNOSIS — R262 Difficulty in walking, not elsewhere classified: Secondary | ICD-10-CM

## 2015-10-29 NOTE — Therapy (Signed)
[facility] [facility] [address] , [place], [address] Phone: [phone]   Fax:  [phone]  Physical Therapy Treatment  Patient Details  Name: Gina MunsonDobie P Gibbs MRN: [id number] Date of Birth: 17-Oct-1946 Referring Provider: Romero BellingWesley Ibazebo  Encounter Date: 10/29/2015      PT End of Session - 10/29/15 0944    Visit Number 3   Number of Visits 10   Date for PT Re-Evaluation 11/21/15   Authorization Type Medicare   Authorization Time Period 10/23/15-12/24/15   Authorization - Visit Number 3   Authorization - Number of Visits 10   PT Start Time 0940   PT Stop Time 1017   PT Time Calculation (min) 37 min   Activity Tolerance Patient tolerated treatment well   Behavior During Therapy [facility] for tasks assessed/performed      Past Medical History  Diagnosis Date  . Complication of anesthesia   . PONV (postoperative nausea and vomiting)   . Arthritis   . GERD (gastroesophageal reflux disease)     Past Surgical History  Procedure Laterality Date  . Ear cyst excision  02/23/2012    Procedure: CYST REMOVAL;  Surgeon: Nicki ReaperGary R Kuzma, MD;  Location: [facility];  Service: Orthopedics;  Laterality: Left;  debridement distal interphalangeal left index finger, excision cyst/loose body  . Fracture surgery  09/2013    right ankle    There were no vitals filed for this visit.  Visit Diagnosis:  Left low back pain, with sciatica presence unspecified  Piriformis muscle pain  Sacroiliac joint dysfunction  Weakness of both legs  Difficulty walking      Subjective Assessment - 10/29/15 0942    Subjective Pt reports she is feeling good today, has been compliant with HEP daily, completed this morning prior therapy.   Currently in Pain? No/denies            [facility] - 10/29/15 0001    Assessment   Medical Diagnosis L LBP   Referring Provider Romero BellingWesley Ibazebo   Onset Date/Surgical Date 06/26/15  approximate date   Next MD Visit January   Prior Therapy no                     [facility] Adult PT Treatment/Exercise - 10/29/15 0001    Lumbar Exercises: Supine   Ab Set 15 reps;3 seconds   Bridge 10 reps   Bridge Limitations radicular symptoms, reduced reps   Lumbar Exercises: Sidelying   Clam 15 reps   Lumbar Exercises: Prone   Other Prone Lumbar Exercises prone heel squeeze 3" x 15   Knee/Hip Exercises: Stretches   Active Hamstring Stretch Both;3 reps;30 seconds   Active Hamstring Stretch Limitations 12" step   Piriformis Stretch 3 reps;30 seconds   Piriformis Stretch Limitations figure 4 supine with towel   Knee/Hip Exercises: Standing   Hip Abduction 10 reps;Both   Hip Extension 10 reps   Lateral Step Up 10 reps;Step Height: 4";Both   Other Standing Knee Exercises 3D hip excursion 10x (5 squats)   Manual Therapy   Manual Therapy Soft tissue mobilization;Joint mobilization   Manual therapy comments performed following to therex   Joint Mobilization SI within alignment, no   Soft tissue mobilization to L piriformis in sidelying                  PT Short Term Goals - 10/23/15 1209    PT SHORT TERM GOAL #1   Title Pt  will be independent with HEP.   Time 2   Period Weeks   Status New   PT SHORT TERM GOAL #2   Title Improve BLE strength and power evidenced by five time sit to stand time of 15 seconds or less.    Time 2   Period Weeks   Status New   PT SHORT TERM GOAL #3   Title Improve bilateral hip extensor strength to 4-/5 or greater to improve ability to ascend/descend stairs.    Time 2   Period Weeks   Status New           PT Long Term Goals - 10/23/15 1213    PT LONG TERM GOAL #1   Title Pt will be independent with advanced HEP.   Time 4   Period Weeks   Status New   PT LONG TERM GOAL #2   Title Pt will complete five time sit to stand in 12 seconds or less to demonstrate improved BLE power.    Time 4   Period Weeks   Status New   PT LONG TERM GOAL  #3   Title Restore full and painfree lumbar AROM to allow pt to complete functional tasks without pain.   Time 4   Period Weeks   Status New   PT LONG TERM GOAL #4   Title Pt will report having full night's sleep without being woken from pain in buttock/back.    Time 4   Period Weeks   Status New   PT LONG TERM GOAL #5   Title Improve hip extension and abduction strength to 4/5 or greater to improve gait mechanics.    Time 4   Period Weeks               Plan - 10/29/15 1009    Clinical Impression Statement SI joint within alignment this session.  Began 3D hip excursion for hip mobility and gluteal strengtheing, continued with core and pelvic stabilization exercises to keep SI alignment intact.  Pt reports radicular symptoms with squats and bridges to Lt toe, able to resolve with piriformis stretches and SKTC.  Ended session with manual with noted tight piriformis, able to reduce but unable to fully resolve tightness with manual.  No reports of pain or radicular symptoms at end of session.   PT Next Visit Plan Check SI alignment, continue with manual therapy to L piriformis, progress hip and core strengthening        Problem List There are no active problems to display for this patient.  [address] LPTA; CBIS [phone]  Juel Burrow 10/29/2015, 10:31 AM  [place] [facility] [address] [place], [place], [address] Phone: [phone]   Fax:  [phone]  Name: Gina Gibbs MRN: [id number] Date of Birth: Nov 05, 1946

## 2015-11-03 ENCOUNTER — Ambulatory Visit (HOSPITAL_COMMUNITY): Payer: Medicare PPO

## 2015-11-03 DIAGNOSIS — R262 Difficulty in walking, not elsewhere classified: Secondary | ICD-10-CM | POA: Diagnosis not present

## 2015-11-03 DIAGNOSIS — M7918 Myalgia, other site: Secondary | ICD-10-CM

## 2015-11-03 DIAGNOSIS — R29898 Other symptoms and signs involving the musculoskeletal system: Secondary | ICD-10-CM

## 2015-11-03 DIAGNOSIS — M533 Sacrococcygeal disorders, not elsewhere classified: Secondary | ICD-10-CM

## 2015-11-03 DIAGNOSIS — M791 Myalgia: Secondary | ICD-10-CM | POA: Diagnosis not present

## 2015-11-03 DIAGNOSIS — M545 Low back pain: Secondary | ICD-10-CM

## 2015-11-03 NOTE — Therapy (Signed)
[facility] [address] [place], [place], [address] Phone: [phone]   Fax:  [phone]  Physical Therapy Treatment  Patient Details  Name: Gina Gibbs MRN: [id number] Date of Birth: 1945-12-25 Referring Provider: Laroy Apple  Encounter Date: 11/03/2015      PT End of Session - 11/03/15 0949    Visit Number 4   Number of Visits 10   Date for PT Re-Evaluation 11/21/15   Authorization Type Medicare   Authorization Time Period 10/23/15-12/24/15   Authorization - Visit Number 4   Authorization - Number of Visits 10   PT Start Time 0930   PT Stop Time 1012   PT Time Calculation (min) 42 min   Activity Tolerance Patient tolerated treatment well   Behavior During Therapy [facility] for tasks assessed/performed      Past Medical History  Diagnosis Date  . Complication of anesthesia   . PONV (postoperative nausea and vomiting)   . Arthritis   . GERD (gastroesophageal reflux disease)     Past Surgical History  Procedure Laterality Date  . Ear cyst excision  02/23/2012    Procedure: CYST REMOVAL;  Surgeon: Wynonia Sours, MD;  Location: [facility];  Service: Orthopedics;  Laterality: Left;  debridement distal interphalangeal left index finger, excision cyst/loose body  . Fracture surgery  09/2013    right ankle    There were no vitals filed for this visit.  Visit Diagnosis:  Left low back pain, with sciatica presence unspecified  Piriformis muscle pain  Sacroiliac joint dysfunction  Weakness of both legs  Difficulty walking      Subjective Assessment - 11/03/15 0937    Subjective Pt stated pain free today, main problem with radicular symptoms with carrying things and reaching.   Currently in Pain? No/denies   Pain Score 0-No pain   Pain Descriptors / Indicators Tingling  Radicular posterior Lt LE to toes            [facility] Adult PT Treatment/Exercise - 11/03/15 0001    Lumbar Exercises: Supine   Ab  Set 15 reps;3 seconds   Bent Knee Raise 10 reps;3 seconds   Bridge 10 reps   Bridge Limitations radicular symptoms, reduced reps   Lumbar Exercises: Sidelying   Clam 15 reps   Hip Abduction 10 reps   Knee/Hip Exercises: Stretches   Active Hamstring Stretch Both;3 reps;30 seconds   Active Hamstring Stretch Limitations 12" step   Piriformis Stretch 3 reps;30 seconds   Piriformis Stretch Limitations figure 4 supine with towel   Knee/Hip Exercises: Standing   Lateral Step Up 10 reps;Step Height: 4";Both   Other Standing Knee Exercises 3D hip excursion 10x (5 squats)   Manual Therapy   Manual Therapy Soft tissue mobilization   Manual therapy comments performed prior to therex   Joint Mobilization SI within alignment, no MET required   Soft tissue mobilization to L piriformis in sidelying                  PT Short Term Goals - 10/23/15 1209    PT SHORT TERM GOAL #1   Title Pt will be independent with HEP.   Time 2   Period Weeks   Status New   PT SHORT TERM GOAL #2   Title Improve BLE strength and power evidenced by five time sit to stand time of 15 seconds or less.    Time 2   Period Weeks   Status New  PT SHORT TERM GOAL #3   Title Improve bilateral hip extensor strength to 4-/5 or greater to improve ability to ascend/descend stairs.    Time 2   Period Weeks   Status New           PT Long Term Goals - 10/23/15 1213    PT LONG TERM GOAL #1   Title Pt will be independent with advanced HEP.   Time 4   Period Weeks   Status New   PT LONG TERM GOAL #2   Title Pt will complete five time sit to stand in 12 seconds or less to demonstrate improved BLE power.    Time 4   Period Weeks   Status New   PT LONG TERM GOAL #3   Title Restore full and painfree lumbar AROM to allow pt to complete functional tasks without pain.   Time 4   Period Weeks   Status New   PT LONG TERM GOAL #4   Title Pt will report having full night's sleep without being woken from pain in  buttock/back.    Time 4   Period Weeks   Status New   PT LONG TERM GOAL #5   Title Improve hip extension and abduction strength to 4/5 or greater to improve gait mechanics.    Time 4   Period Weeks               Plan - 11/03/15 1020    Clinical Impression Statement SI within alignment this session, no muscle energy technique required.  Session focus on improving hip and core strengthening and functional stretches and manual technqiues to reduce radicular symptoms.  Added sidelying abduction for glut med strengthening with cueing for form.  Pt reports radicular symptoms with squats, bridges and exercises with anterior pelvic rotation movements.  Reports of relief with manual compression to Lt piriformis and stretches, noted tightness with Lt pirifomis with manual technqiues.  No reports of increased pain through session, pt reports radicular symptoms reduced at end of session.     PT Next Visit Plan Check SI alignment, continue with manual therapy to L piriformis, progress hip and core strengthening        Problem List There are no active problems to display for this patient.  [address], LPTA; [place]  Aldona Lento 11/03/2015, 10:33 AM  [facility], [place], [address] Phone: [phone]   Fax:  [phone]  Name: Gina Gibbs MRN: [id number] Date of Birth: 06-Feb-1946

## 2015-11-05 ENCOUNTER — Ambulatory Visit (HOSPITAL_COMMUNITY): Payer: Medicare PPO

## 2015-11-05 DIAGNOSIS — M791 Myalgia: Secondary | ICD-10-CM | POA: Diagnosis not present

## 2015-11-05 DIAGNOSIS — R29898 Other symptoms and signs involving the musculoskeletal system: Secondary | ICD-10-CM

## 2015-11-05 DIAGNOSIS — R262 Difficulty in walking, not elsewhere classified: Secondary | ICD-10-CM | POA: Diagnosis not present

## 2015-11-05 DIAGNOSIS — M7918 Myalgia, other site: Secondary | ICD-10-CM

## 2015-11-05 DIAGNOSIS — M545 Low back pain: Secondary | ICD-10-CM

## 2015-11-05 DIAGNOSIS — M533 Sacrococcygeal disorders, not elsewhere classified: Secondary | ICD-10-CM | POA: Diagnosis not present

## 2015-11-05 NOTE — Therapy (Signed)
[facility] [address] [place], [place], [address] Phone: [phone]   Fax:  [phone]  Physical Therapy Treatment  Patient Details  Name: Gina MunsonDobie P Gibbs MRN: [id number] Date of Birth: [birth year] Referring Provider: Romero BellingWesley Ibazebo  Encounter Date: 11/05/2015      PT End of Session - 11/05/15 0817    Visit Number 5   Number of Visits 10   Date for PT Re-Evaluation 11/21/15   Authorization Type Medicare   Authorization Time Period 10/23/15-12/24/15   Authorization - Visit Number 5   Authorization - Number of Visits 10   PT Start Time 0800   PT Stop Time 0845   PT Time Calculation (min) 45 min   Activity Tolerance Patient tolerated treatment well   Behavior [facility] for tasks assessed/performed      Past Medical History  Diagnosis Date  . Complication of anesthesia   . PONV (postoperative nausea and vomiting)   . Arthritis   . GERD (gastroesophageal reflux disease)     Past Surgical History  Procedure Laterality Date  . Ear cyst excision  02/23/2012    Procedure: CYST REMOVAL;  Surgeon: Nicki ReaperGary R Kuzma, MD;  Location: [facility];  Service: Orthopedics;  Laterality: Left;  debridement distal interphalangeal left index finger, excision cyst/loose body  . Fracture surgery  09/2013    right ankle    There were no vitals filed for this visit.  Visit Diagnosis:  Left low back pain, with sciatica presence unspecified  Piriformis muscle pain  Sacroiliac joint dysfunction  Weakness of both legs  Difficulty walking      Subjective Assessment - 11/05/15 0800    Subjective Pt stated she had increased difficulty moving in bed and radicular symptoms down Lt LE last night.  Currenty pain free with no radicular symptoms.   Currently in Pain? No/denies            [facility] - 11/05/15 0001    Lumbar Exercises: Stretches   Single Knee to Chest Stretch 3 reps;30 seconds   Piriformis Stretch 2 reps;30 seconds   Piriformis Stretch Limitations supine figure 4   Lumbar Exercises: Supine   Ab Set 15 reps;3 seconds   Bent Knee Raise 5 reps;3 seconds   Bridge 10 reps   Bridge Limitations Lt foot closer   Straight Leg Raise 10 reps   Lumbar Exercises: Prone   Other Prone Lumbar Exercises prone heel squeeze 3" x 15   Manual Therapy   Manual Therapy Muscle Energy Technique;Soft tissue mobilization;Joint mobilization   Manual therapy comments performed prior to therex   Joint Mobilization Rt anterior mobs in prone   Soft tissue mobilization to L piriformis in sidelying   Muscle Energy Technique Lt anterior rotation in sidelying                  PT Short Term Goals - 10/23/15 1209    PT SHORT TERM GOAL #1   Title Pt will be independent with HEP.   Time 2   Period Weeks   Status New   PT SHORT TERM GOAL #2   Title Improve BLE strength and power evidenced by five time sit to stand time of 15 seconds or less.    Time 2   Period Weeks   Status New   PT SHORT TERM GOAL #3   Title Improve bilateral hip extensor strength to 4-/5 or greater to improve ability to ascend/descend stairs.  Time 2   Period Weeks   Status New           PT Long Term Goals - 10/23/15 1213    PT LONG TERM GOAL #1   Title Pt will be independent with advanced HEP.   Time 4   Period Weeks   Status New   PT LONG TERM GOAL #2   Title Pt will complete five time sit to stand in 12 seconds or less to demonstrate improved BLE power.    Time 4   Period Weeks   Status New   PT LONG TERM GOAL #3   Title Restore full and painfree lumbar AROM to allow pt to complete functional tasks without pain.   Time 4   Period Weeks   Status New   PT LONG TERM GOAL #4   Title Pt will report having full night's sleep without being woken from pain in buttock/back.    Time 4   Period Weeks   Status New   PT LONG TERM GOAL #5   Title Improve hip extension and abduction strength to 4/5  or greater to improve gait mechanics.    Time 4   Period Weeks               Plan - 11/05/15 1610    Clinical Impression Statement Lt SI anteriorly rotated with reports of increased pain with anterior pelvic rotation and spinal extension movements.  Muscle energy technique complete but unable to fully align SI joint alignment due to gluteal weakness.  SI joint hypermobile, pt may benefit from SI belt while strengthening,  Session focus on hip and core strengthening to assist with SI alignment.  Ended session with manual soft tissue mobilization to piriformis and IT band with tightness noted.  No reports of pain at end of session.   PT Next Visit Plan Check SI alignment, continue with manual therapy to L piriformis, progress hip and core strengthening.  Trial with SI belt         Problem List There are no active problems to display for this patient.  [address], LPTA; CBIS [phone]  Juel Burrow 11/05/2015, 9:04 AM  [facility] [address] [place], [place], [address] Phone: [phone]   Fax:  [phone]  Name: Gina Gibbs MRN: [id number] Date of Birth: [birth year]

## 2015-11-11 ENCOUNTER — Ambulatory Visit (HOSPITAL_COMMUNITY): Payer: Medicare PPO | Admitting: Physical Therapy

## 2015-11-11 DIAGNOSIS — M533 Sacrococcygeal disorders, not elsewhere classified: Secondary | ICD-10-CM

## 2015-11-11 DIAGNOSIS — M545 Low back pain: Secondary | ICD-10-CM

## 2015-11-11 DIAGNOSIS — M7918 Myalgia, other site: Secondary | ICD-10-CM

## 2015-11-11 DIAGNOSIS — M791 Myalgia: Secondary | ICD-10-CM | POA: Diagnosis not present

## 2015-11-11 DIAGNOSIS — R262 Difficulty in walking, not elsewhere classified: Secondary | ICD-10-CM | POA: Diagnosis not present

## 2015-11-11 DIAGNOSIS — R29898 Other symptoms and signs involving the musculoskeletal system: Secondary | ICD-10-CM

## 2015-11-11 NOTE — Therapy (Signed)
[facility] [address] [place], [place], [address] Phone: [phone]   Fax:  [phone]  Physical Therapy Treatment  Patient Details  Name: Gina Gibbs MRN: [id number] Date of Birth: 03/02/1946 Referring Provider: Laroy Apple  Encounter Date: 11/11/2015      PT End of Session - 11/11/15 1650    Visit Number 6   Date for PT Re-Evaluation 11/21/15   Authorization Type [employer] medicare   Authorization Time Period 10/23/15-12/24/15   Authorization - Visit Number 6   Authorization - Number of Visits 6   PT Start Time 6294   PT Stop Time 1100   PT Time Calculation (min) 45 min   Activity Tolerance Patient tolerated treatment well   Behavior During Therapy [facility] for tasks assessed/performed      Past Medical History  Diagnosis Date  . Complication of anesthesia   . PONV (postoperative nausea and vomiting)   . Arthritis   . GERD (gastroesophageal reflux disease)     Past Surgical History  Procedure Laterality Date  . Ear cyst excision  02/23/2012    Procedure: CYST REMOVAL;  Surgeon: Wynonia Sours, MD;  Location: [facility];  Service: Orthopedics;  Laterality: Left;  debridement distal interphalangeal left index finger, excision cyst/loose body  . Fracture surgery  09/2013    right ankle    There were no vitals filed for this visit.  Visit Diagnosis:  Left low back pain, with sciatica presence unspecified  Piriformis muscle pain  Sacroiliac joint dysfunction  Weakness of both legs      Subjective Assessment - 11/11/15 1021    Subjective Pt reports that she had a really bad day yesterday, she was having a lot of throbbing pain in her entire LLE. She has noticed a lot of improvements overall in her ability to stand for longer periods of time. She still gets numbness at time   How long can you sit comfortably? no limitations   How long can you stand comfortably? an hour   How long can you walk comfortably?  45 minutes or more   Currently in Pain? No/denies   Pain Score 0-No pain            [facility] PT Assessment - 11/11/15 0001    Observation/Other Assessments   Focus on Therapeutic Outcomes (FOTO)  44% limited   AROM   Right Hip External Rotation  30   Right Hip Internal Rotation  42   Left Hip External Rotation  36   Left Hip Internal Rotation  42   Lumbar Flexion 95   Lumbar Extension 30   Lumbar - Right Side Bend 30   Lumbar - Left Side Bend 35   Strength   Right Hip Flexion 4+/5   Right Hip Extension 4-/5   Right Hip ABduction 4/5   Left Hip Flexion 4+/5   Left Hip Extension 4-/5   Left Hip ABduction 4-/5   Right Knee Flexion 5/5   Right Knee Extension 4+/5   Left Knee Flexion 5/5   Left Knee Extension 4+/5   Transfers   Five time sit to stand comments  11.82                     [facility] Adult PT Treatment/Exercise - 11/11/15 0001    Lumbar Exercises: Sidelying   Hip Abduction 10 reps   Knee/Hip Exercises: Stretches   Active Hamstring Stretch Both;3 reps;30 seconds   Active Hamstring  Stretch Limitations 12" step   Piriformis Stretch 3 reps;30 seconds   Piriformis Stretch Limitations figure 4 supine with towel   Manual Therapy   Manual Therapy Soft tissue mobilization   Manual therapy comments attempted SI belt, pt reported increased symptoms   Soft tissue mobilization to L piriformis in sidelying                PT Education - 11/11/15 1650    Education provided Yes   Education Details HEP updated   Person(s) Educated Patient   Methods Explanation;Handout   Comprehension Verbalized understanding;Returned demonstration          PT Short Term Goals - 11/11/15 1652    PT SHORT TERM GOAL #1   Title Pt will be independent with HEP.   Time 2   Period Weeks   Status Achieved   PT SHORT TERM GOAL #2   Title Improve BLE strength and power evidenced by five time sit to stand time of 15 seconds or less.    Time 2   Period Weeks   Status  Achieved   PT SHORT TERM GOAL #3   Title Improve bilateral hip extensor strength to 4-/5 or greater to improve ability to ascend/descend stairs.    Time 2   Period Weeks   Status Achieved           PT Long Term Goals - 11/11/15 1653    PT LONG TERM GOAL #1   Title Pt will be independent with advanced HEP.   Time 4   Period Weeks   Status Achieved   PT LONG TERM GOAL #2   Title Pt will complete five time sit to stand in 12 seconds or less to demonstrate improved BLE power.    Time 4   Period Weeks   Status Achieved   PT LONG TERM GOAL #3   Title Restore full and painfree lumbar AROM to allow pt to complete functional tasks without pain.   Time 4   Period Weeks   Status Achieved   PT LONG TERM GOAL #4   Title Pt will report having full night's sleep without being woken from pain in buttock/back.    Time 4   Period Weeks   Status Partially Met   PT LONG TERM GOAL #5   Title Improve hip extension and abduction strength to 4/5 or greater to improve gait mechanics.    Time 4   Period Weeks   Status Not Met               Plan - 11/11/15 1651    Clinical Impression Statement Reassessment completed today. Pt continues to reports radicular sx into her LLE that are not worsened with lumbar AROM. SI belt was trialed today and was unsuccessful, with pt reporting worsening of symptoms. Pt demonstrates global hypermobility in her lumbar spine and SI joint, and she was educated on continueing with core and pelvic stabilization exercises to decrease symptoms of numbness. Pt has met 3/5 LTGS, and she wishes to be discharged at this time as her insurance has not approved any more visits and she feels that she would rather continue with her HEP than pay out of pocket. Pt is returning to her MD next month, and was encouraged to return if her symptoms worsened.    PT Treatment/Interventions ADLs/Self Care Home Management;Moist Heat;Gait training;Therapeutic activities;Therapeutic  exercise;Balance training;Neuromuscular re-education;Patient/family education;Manual techniques;Passive range of motion   PT Next Visit Plan Discharged to HEP  G-Codes - 11/11/15 1655    Functional Assessment Tool Used FOTO   Functional Limitation Mobility: Walking and moving around   Mobility: Walking and Moving Around Goal Status [phone]) At least 40 percent but less than 60 percent impaired, limited or restricted   Mobility: Walking and Moving Around Discharge Status [phone]) At least 40 percent but less than 60 percent impaired, limited or restricted      Problem List There are no active problems to display for this patient.    PHYSICAL THERAPY DISCHARGE SUMMARY  Visits from Start of Care: 6  Current functional level related to goals / functional outcomes: See above   Remaining deficits: Pt continues to demonstrate core and hip weakness   Education / Equipment: HEP  Plan: Patient agrees to discharge.  Patient goals were partially met. Patient is being discharged due to financial reasons.  ?????      Hilma Favors, PT, DPT [phone] 11/11/2015, 4:56 PM  [facility] [address] [place], [place], [address] Phone: [phone]   Fax:  [phone]  Name: Gina Gibbs MRN: [id number] Date of Birth: 19-Apr-1946

## 2015-11-11 NOTE — Patient Instructions (Signed)
Bent Leg Lift (Hook-Lying)    Tighten stomach and slowly raise right leg ____ inches from floor. Keep trunk rigid. Hold ____ seconds. Repeat ____ times per set. Do ____ sets per session. Do ____ sessions per day.  http://orth.exer.us/1090   Copyright  VHI. All rights reserved.  HIP: Abduction - Side-Lying    Lie on side, legs straight and in line with trunk. Squeeze glutes. Raise top leg up and slightly back. Point toes forward. ___ reps per set, ___ sets per day, ___ days per week Bend bottom leg to stabilize pelvis.  Copyright  VHI. All rights reserved.

## 2015-11-14 ENCOUNTER — Encounter (HOSPITAL_COMMUNITY): Payer: Medicare PPO

## 2015-11-19 ENCOUNTER — Encounter (HOSPITAL_COMMUNITY): Payer: Medicare PPO

## 2015-11-21 ENCOUNTER — Encounter (HOSPITAL_COMMUNITY): Payer: Medicare PPO

## 2016-06-01 ENCOUNTER — Encounter (HOSPITAL_COMMUNITY): Payer: Medicare PPO

## 2016-06-01 ENCOUNTER — Other Ambulatory Visit (HOSPITAL_COMMUNITY): Payer: Medicare PPO

## 2016-06-03 ENCOUNTER — Encounter (HOSPITAL_COMMUNITY)
Admission: RE | Admit: 2016-06-03 | Discharge: 2016-06-03 | Disposition: A | Discharge status: Home / Self Care | Payer: Medicare Other | Source: Ambulatory Visit | Attending: Pulmonary Disease | Admitting: Pulmonary Disease

## 2016-06-03 DIAGNOSIS — M810 Age-related osteoporosis without current pathological fracture: Secondary | ICD-10-CM | POA: Diagnosis present

## 2016-06-03 LAB — COMPREHENSIVE METABOLIC PANEL
ALBUMIN: 4.2 g/dL (ref 3.5–5.0)
ALT: 16 U/L (ref 14–54)
ANION GAP: 6 (ref 5–15)
AST: 17 U/L (ref 15–41)
Alkaline Phosphatase: 51 U/L (ref 38–126)
BUN: 19 mg/dL (ref 6–20)
CHLORIDE: 109 mmol/L (ref 101–111)
CO2: 25 mmol/L (ref 22–32)
CREATININE: 0.7 mg/dL (ref 0.44–1.00)
Calcium: 9 mg/dL (ref 8.9–10.3)
GFR calc Af Amer: >60 mL/min (ref >60)
GFR calc non Af Amer: >60 mL/min (ref >60)
Glucose, Bld: 84 mg/dL (ref 65–99)
Potassium: 3.8 mmol/L (ref 3.5–5.1)
SODIUM: 140 mmol/L (ref 135–145)
Total Bilirubin: 1.4 mg/dL — ABNORMAL HIGH (ref 0.3–1.2)
Total Protein: 6.8 g/dL (ref 6.5–8.1)

## 2016-06-03 MED ORDER — ZOLEDRONIC ACID 5 MG/100ML IV SOLN
5.0000 mg | Freq: Once | INTRAVENOUS | Status: AC
  Administered 2016-06-03: 5 mg via INTRAVENOUS
  Filled 2016-06-03: qty 100

## 2016-08-18 ENCOUNTER — Other Ambulatory Visit (HOSPITAL_COMMUNITY): Payer: Self-pay | Admitting: Pulmonary Disease

## 2016-08-18 DIAGNOSIS — Z1231 Encounter for screening mammogram for malignant neoplasm of breast: Secondary | ICD-10-CM

## 2016-09-10 ENCOUNTER — Ambulatory Visit (HOSPITAL_COMMUNITY)
Admission: RE | Admit: 2016-09-10 | Discharge: 2016-09-10 | Disposition: A | Discharge status: Home / Self Care | Payer: Medicare Other | Source: Ambulatory Visit | Attending: Pulmonary Disease | Admitting: Pulmonary Disease

## 2016-09-10 DIAGNOSIS — Z1231 Encounter for screening mammogram for malignant neoplasm of breast: Secondary | ICD-10-CM | POA: Diagnosis not present

## 2016-09-13 ENCOUNTER — Encounter: Payer: Self-pay | Admitting: Obstetrics & Gynecology

## 2016-09-13 ENCOUNTER — Ambulatory Visit (INDEPENDENT_AMBULATORY_CARE_PROVIDER_SITE_OTHER): Payer: Medicare Other | Admitting: Obstetrics & Gynecology

## 2016-09-13 VITALS — BP 130/68 | HR 86 | Ht 62.0 in | Wt 146.8 lb

## 2016-09-13 DIAGNOSIS — N993 Prolapse of vaginal vault after hysterectomy: Secondary | ICD-10-CM

## 2016-09-13 MED ORDER — ESTRADIOL 0.1 MG/GM VA CREA: 1.0000 | TOPICAL_CREAM | Freq: Every day | VAGINAL | 12 refills | Status: DC

## 2016-09-13 NOTE — Progress Notes (Signed)
Chief Complaint  Patient presents with  . Bladder Prolapse    Blood pressure 130/68, pulse 86, height 5\' 2"  (1.575 m), weight 146 lb 12.8 oz (66.6 kg).  70 y.o. No obstetric history on file. No LMP recorded. Patient has had a hysterectomy. The current method of family planning is status post hysterectomy.  Outpatient Encounter Prescriptions as of 09/13/2016  Medication Sig Note  . esomeprazole (NEXIUM) 40 MG capsule Take 40 mg by mouth as needed.   Marland Kitchen. levothyroxine (SYNTHROID, LEVOTHROID) 25 MCG tablet Take 25 mcg by mouth daily. 10/09/2013: Received from: External Pharmacy Received Sig:   . meloxicam (MOBIC) 15 MG tablet Take 15 mg by mouth daily.  09/13/2016: Received from: External Pharmacy  . therapeutic multivitamin-minerals (THERAGRAN-M) tablet Take 1 tablet by mouth daily.   Marland Kitchen. estradiol (ESTRACE VAGINAL) 0.1 MG/GM vaginal cream Place 1 Applicatorful vaginally at bedtime.   . [DISCONTINUED] celecoxib (CELEBREX) 200 MG capsule Take 200 mg by mouth 2 (two) times daily.   . [DISCONTINUED] traMADol (ULTRAM) 50 MG tablet Take 1 tablet (50 mg total) by mouth every 6 (six) hours as needed. (Patient not taking: Reported on 09/13/2016)    No facility-administered encounter medications on file as of 09/13/2016.     Subjective Do to work and life feels like something is "dropping out down there" No urinary symptoms No incontinence Some urgency No bleeding   Objective General WDWN female NAD Vulva:  normal appearing vulva with no masses, tenderness or lesions atrophic Vagina:  Atrophic with Grade II vaginal apex prolpase Cervix:  absent Uterus:  uterus absent Adnexa: ovaries:,     Pertinent ROS No burning with urination, frequency or urgency No nausea, vomiting or diarrhea Nor fever chills or other constitutional symptoms   Labs or studies     Impression Diagnoses this Encounter::   ICD-9-CM ICD-10-CM   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 meloxicam (MOBIC) 15 MG tablet    Sig: Take 15 mg by mouth daily.   Marland Kitchen. estradiol (ESTRACE VAGINAL) 0.1 MG/GM vaginal cream    Sig: Place 1 Applicatorful vaginally at bedtime.    Dispense:  42.5 g    Refill:  12    Labs or Scans Ordered: No orders of the defined types were placed in this encounter.   Management:: Begin vaginal estrogen for management of menopausal tissue deterioration leading to prolapse of pex  Follow up Return in about 6 weeks (around 10/25/2016) for Follow up, with Dr Despina HiddenEure.           All questions were answered.  Past Medical History:  Diagnosis Date  . Arthritis   . Complication of anesthesia   . GERD (gastroesophageal reflux disease)   . PONV (postoperative nausea and vomiting)     Past Surgical History:  Procedure Laterality Date  . ABDOMINAL HYSTERECTOMY    . EAR CYST EXCISION  02/23/2012   Procedure: CYST REMOVAL;  Surgeon: Nicki ReaperGary R Kuzma, MD;  Location: [facility];  Service: Orthopedics;  Laterality: Left;  debridement distal interphalangeal left index finger, excision cyst/loose body  . FRACTURE SURGERY  09/2013   right ankle  . spinal cyst removal      OB History    No data available      Allergies  Allergen Reactions  . Tape     adhesives    Social  History   Social History  . Marital status: Married    Spouse name: N/A  . Number of children: N/A  . Years of education: N/A   Social History Main Topics  . Smoking status: Never Smoker  . Smokeless tobacco: Never Used  . Alcohol use Yes     Comment: rare  . Drug use: No  . Sexual activity: No   Other Topics Concern  . None   Social History Narrative  . None    Family History  Problem Relation Age of Onset  . Diabetes Mother

## 2016-10-25 ENCOUNTER — Encounter: Payer: Self-pay | Admitting: Obstetrics & Gynecology

## 2016-10-25 ENCOUNTER — Ambulatory Visit (INDEPENDENT_AMBULATORY_CARE_PROVIDER_SITE_OTHER): Payer: Medicare Other | Admitting: Obstetrics & Gynecology

## 2016-10-25 VITALS — BP 140/80 | HR 78 | Ht 62.2 in | Wt 148.0 lb

## 2016-10-25 DIAGNOSIS — N952 Postmenopausal atrophic vaginitis: Secondary | ICD-10-CM | POA: Diagnosis not present

## 2016-10-25 DIAGNOSIS — N993 Prolapse of vaginal vault after hysterectomy: Secondary | ICD-10-CM | POA: Diagnosis not present

## 2016-10-25 NOTE — Progress Notes (Signed)
Chief Complaint  Patient presents with  . Follow-up    Blood pressure 140/80, pulse 78, height 5' 2.2" (1.58 m), weight 148 lb (67.1 kg).  70 y.o. No obstetric history on file. No LMP recorded. Patient has had a hysterectomy. The current method of family planning is status post hysterectomy.  Outpatient Encounter Prescriptions as of 10/25/2016  Medication Sig Note  . esomeprazole (NEXIUM) 40 MG capsule Take 40 mg by mouth as needed.   Marland Kitchen. estradiol (ESTRACE VAGINAL) 0.1 MG/GM vaginal cream Place 1 Applicatorful vaginally at bedtime.   Marland Kitchen. levothyroxine (SYNTHROID, LEVOTHROID) 25 MCG tablet Take 25 mcg by mouth daily. 10/09/2013: Received from: External Pharmacy Received Sig:   . meloxicam (MOBIC) 15 MG tablet Take 15 mg by mouth daily.  09/13/2016: Received from: External Pharmacy  . therapeutic multivitamin-minerals (THERAGRAN-M) tablet Take 1 tablet by mouth daily.    No facility-administered encounter medications on file as of 10/25/2016.     Subjective Pt is seen 5 weeks post initial visit She was seen for the sensation of something was dropping down there On exam she had moderate vaginal atrophic changes and grade 2 cystocoele and grade 2 vaginal apex prolapse Placed her on vaginal estrogen therapy  She states she has had noticeable improvement in her symptoms, currently using the vaginal estrace qohs "significantly" improved by patient  Objective Much better tissue integrity and mucosa is dramatically improved on the estrogen  Pertinent ROS No rurinary loss or constipation No splinting needed  Labs or studies     Impression Diagnoses this Encounter::   ICD-9-CM ICD-10-CM   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   Labs or Scans Ordered: No orders of the defined types were  placed in this encounter.   Management:: Continue qohs vaginal estrgoen  Follow up Return in about 3 months (around 01/23/2017) for Follow up, with Dr Despina HiddenEure.        Face to face time:  15 minutes  Greater than 50% of the visit time was spent in counseling and coordination of care with the patient.  The summary and outline of the counseling and care coordination is summarized in the note above.   All questions were answered.  Past Medical History:  Diagnosis Date  . Arthritis   . Complication of anesthesia   . GERD (gastroesophageal reflux disease)   . PONV (postoperative nausea and vomiting)     Past Surgical History:  Procedure Laterality Date  . ABDOMINAL HYSTERECTOMY    . EAR CYST EXCISION  02/23/2012   Procedure: CYST REMOVAL;  Surgeon: Nicki ReaperGary R Kuzma, MD;  Location: [facility];  Service: Orthopedics;  Laterality: Left;  debridement distal interphalangeal left index finger, excision cyst/loose body  . FRACTURE SURGERY  09/2013   right ankle  . spinal cyst removal      OB History    No data available      Allergies  Allergen Reactions  . Tape     adhesives    Social History   Social History  . Marital status: Married    Spouse name: N/A  . Number of children: N/A  . Years of education: N/A   Social History Main Topics  . Smoking status: Never Smoker  . Smokeless tobacco: Never Used  . Alcohol use Yes  Comment: rare  . Drug use: No  . Sexual activity: No   Other Topics Concern  . None   Social History Narrative  . None    Family History  Problem Relation Age of Onset  . Diabetes Mother

## 2016-11-16 LAB — TSH: TSH: 1.95 (ref 0.41–5.90)

## 2017-01-24 ENCOUNTER — Ambulatory Visit (INDEPENDENT_AMBULATORY_CARE_PROVIDER_SITE_OTHER): Payer: Medicare Other | Admitting: Obstetrics & Gynecology

## 2017-01-24 ENCOUNTER — Encounter: Payer: Self-pay | Admitting: Obstetrics & Gynecology

## 2017-01-24 VITALS — BP 132/72 | HR 72 | Ht 62.0 in | Wt 148.0 lb

## 2017-01-24 DIAGNOSIS — N952 Postmenopausal atrophic vaginitis: Secondary | ICD-10-CM | POA: Diagnosis not present

## 2017-01-24 NOTE — Progress Notes (Signed)
Chief Complaint  Patient presents with  . Follow-up    on Estrace cream; pt requests breast exam    Blood pressure 132/72, pulse 72, height 5\' 2"  (1.575 m), weight 148 lb (67.1 kg).  71 y.o. No obstetric history on file. No LMP recorded. Patient has had a hysterectomy. The current method of family planning is status post hysterectomy.  Outpatient Encounter Prescriptions as of 01/24/2017  Medication Sig Note  . estradiol (ESTRACE VAGINAL) 0.1 MG/GM vaginal cream Place 1 Applicatorful vaginally at bedtime.   Marland Kitchen. levothyroxine (SYNTHROID, LEVOTHROID) 25 MCG tablet Take 25 mcg by mouth daily. 10/09/2013: Received from: External Pharmacy Received Sig:   . meloxicam (MOBIC) 15 MG tablet Take 15 mg by mouth daily.  09/13/2016: Received from: External Pharmacy  . therapeutic multivitamin-minerals (THERAGRAN-M) tablet Take 1 tablet by mouth daily.   . [DISCONTINUED] esomeprazole (NEXIUM) 40 MG capsule Take 40 mg by mouth as needed.    No facility-administered encounter medications on file as of 01/24/2017.     Subjective Pt is seen 5 months weeks post initial visit She was seen for the sensation of something was dropping down there On exam she had moderate vaginal atrophic changes and grade 2 cystocoele and grade 2 vaginal apex prolapse Placed her on vaginal estrogen therapy  She states she has had noticeable improvement in her symptoms, currently using the vaginal estrace qohs Unfortunately has had a cough for the past 2 months or so, bronchitic   Objective Much better tissue integrity and mucosa is dramatically improved on the estrogen Breast exam no mass discharge or skin changes, normal exam Pertinent ROS No rurinary loss or constipation No splinting needed  Labs or studies     Impression Diagnoses this Encounter::   ICD-9-CM ICD-10-CM   1. Atrophic vaginitis 627.3 N95.2     Established relevant diagnosis(es):   Plan/Recommendations: No orders of the defined types  were placed in this encounter.   Labs or Scans Ordered: No orders of the defined types were placed in this encounter.   Management:: Continue qohs vaginal estrgoen  Follow up Return in about 6 months (around 07/27/2017) for Follow up, with Dr Despina HiddenEure.        Face to face time:  15 minutes  Greater than 50% of the visit time was spent in counseling and coordination of care with the patient.  The summary and outline of the counseling and care coordination is summarized in the note above.   All questions were answered.  Past Medical History:  Diagnosis Date  . Arthritis   . Complication of anesthesia   . GERD (gastroesophageal reflux disease)   . PONV (postoperative nausea and vomiting)     Past Surgical History:  Procedure Laterality Date  . ABDOMINAL HYSTERECTOMY    . EAR CYST EXCISION  02/23/2012   Procedure: CYST REMOVAL;  Surgeon: Nicki ReaperGary R Kuzma, MD;  Location: [facility];  Service: Orthopedics;  Laterality: Left;  debridement distal interphalangeal left index finger, excision cyst/loose body  . FRACTURE SURGERY  09/2013   right ankle  . spinal cyst removal      OB History    Gravida Para Term Preterm AB Living   3 2 2   1 2    SAB TAB Ectopic Multiple Live Births   1       2      Allergies  Allergen Reactions  . Tape     adhesives    Social History  Social History  . Marital status: Married    Spouse name: N/A  . Number of children: N/A  . Years of education: N/A   Social History Main Topics  . Smoking status: Never Smoker  . Smokeless tobacco: Never Used  . Alcohol use Yes     Comment: rare  . Drug use: No  . Sexual activity: No     Comment: hyst   Other Topics Concern  . None   Social History Narrative  . None    Family History  Problem Relation Age of Onset  . Diabetes Mother

## 2017-06-03 ENCOUNTER — Encounter (HOSPITAL_COMMUNITY)
Admission: RE | Admit: 2017-06-03 | Discharge: 2017-06-03 | Disposition: A | Discharge status: Home / Self Care | Payer: Medicare Other | Source: Ambulatory Visit | Attending: Pulmonary Disease | Admitting: Pulmonary Disease

## 2017-06-03 DIAGNOSIS — M810 Age-related osteoporosis without current pathological fracture: Secondary | ICD-10-CM | POA: Insufficient documentation

## 2017-06-03 LAB — HEPATIC FUNCTION PANEL
ALBUMIN: 3.6 g/dL (ref 3.5–5.0)
ALK PHOS: 45 U/L (ref 38–126)
ALT: 18 U/L (ref 14–54)
AST: 16 U/L (ref 15–41)
Bilirubin, Direct: 0.1 mg/dL (ref 0.1–0.5)
Indirect Bilirubin: 0.9 mg/dL (ref 0.3–0.9)
TOTAL PROTEIN: 5.9 g/dL — AB (ref 6.5–8.1)
Total Bilirubin: 1 mg/dL (ref 0.3–1.2)

## 2017-06-03 LAB — COMPREHENSIVE METABOLIC PANEL
ALT: 18 U/L (ref 14–54)
ANION GAP: 5 (ref 5–15)
AST: 18 U/L (ref 15–41)
Albumin: 3.8 g/dL (ref 3.5–5.0)
Alkaline Phosphatase: 48 U/L (ref 38–126)
BILIRUBIN TOTAL: 1.2 mg/dL (ref 0.3–1.2)
BUN: 17 mg/dL (ref 6–20)
CHLORIDE: 108 mmol/L (ref 101–111)
CO2: 28 mmol/L (ref 22–32)
Calcium: 8.9 mg/dL (ref 8.9–10.3)
Creatinine, Ser: 0.76 mg/dL (ref 0.44–1.00)
GFR calc non Af Amer: >60 mL/min (ref >60)
Glucose, Bld: 96 mg/dL (ref 65–99)
Potassium: 3.5 mmol/L (ref 3.5–5.1)
Sodium: 141 mmol/L (ref 135–145)
TOTAL PROTEIN: 6.2 g/dL — AB (ref 6.5–8.1)

## 2017-06-03 LAB — LIPID PANEL
Cholesterol: 167 mg/dL (ref 0–200)
HDL: 46 mg/dL (ref >40)
LDL Cholesterol: 66 mg/dL (ref 0–99)
Total CHOL/HDL Ratio: 3.6 RATIO
Triglycerides: 273 mg/dL — ABNORMAL HIGH (ref <150)
VLDL: 55 mg/dL — ABNORMAL HIGH (ref 0–40)

## 2017-06-03 LAB — PHOSPHORUS: PHOSPHORUS: 3.2 mg/dL (ref 2.5–4.6)

## 2017-06-03 LAB — TSH: TSH: 1.327 u[IU]/mL (ref 0.350–4.500)

## 2017-06-03 LAB — MAGNESIUM: Magnesium: 1.9 mg/dL (ref 1.7–2.4)

## 2017-06-03 MED ORDER — SODIUM CHLORIDE 0.9 % IV SOLN
Freq: Once | INTRAVENOUS | Status: AC
  Administered 2017-06-03: 10:00:00 via INTRAVENOUS

## 2017-06-03 MED ORDER — ZOLEDRONIC ACID 5 MG/100ML IV SOLN
5.0000 mg | Freq: Once | INTRAVENOUS | Status: AC
  Administered 2017-06-03: 5 mg via INTRAVENOUS

## 2017-06-03 MED ORDER — ZOLEDRONIC ACID 5 MG/100ML IV SOLN
INTRAVENOUS | Status: AC
  Filled 2017-06-03: qty 100

## 2017-06-09 ENCOUNTER — Ambulatory Visit (HOSPITAL_COMMUNITY)
Admission: RE | Admit: 2017-06-09 | Discharge: 2017-06-09 | Disposition: A | Discharge status: Home / Self Care | Payer: Medicare Other | Source: Ambulatory Visit | Attending: Pulmonary Disease | Admitting: Pulmonary Disease

## 2017-06-09 ENCOUNTER — Other Ambulatory Visit (HOSPITAL_COMMUNITY): Payer: Self-pay | Admitting: Pulmonary Disease

## 2017-06-09 DIAGNOSIS — M79672 Pain in left foot: Secondary | ICD-10-CM | POA: Diagnosis not present

## 2017-06-30 IMAGING — MR MR LUMBAR SPINE W/O CM
4 of 5 series · 28 of 48 positions shown · non-contrast
Comparison: 05/15/2010 ; 06/14/2007

CLINICAL DATA: Left hip pain.  Numbness in the left foot.

EXAM:
MRI LUMBAR SPINE WITHOUT CONTRAST
TECHNIQUE: Multiplanar, multisequence MR imaging of the lumbar spine was
performed. No intravenous contrast was administered.

[Series 2: T2 · sagittal · 4.0mm · 0.44mm/px · 6 of 14 slices shown (1 of 2)]
[im 1/14]
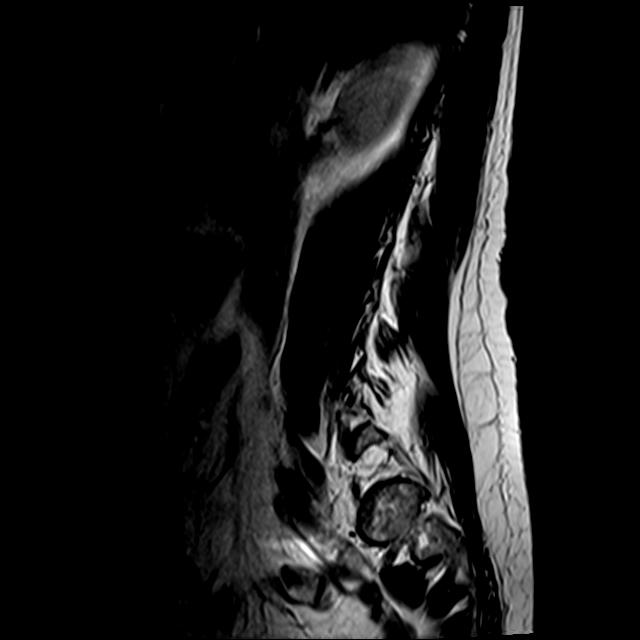
[im 3/14]
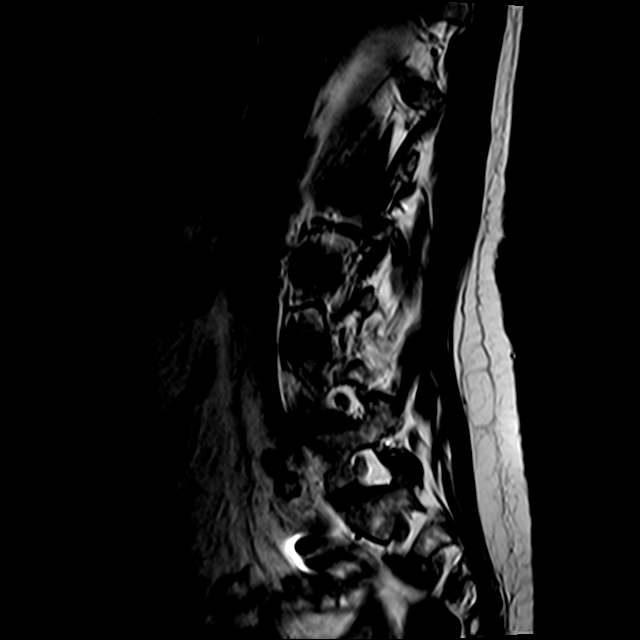
[im 6/14]
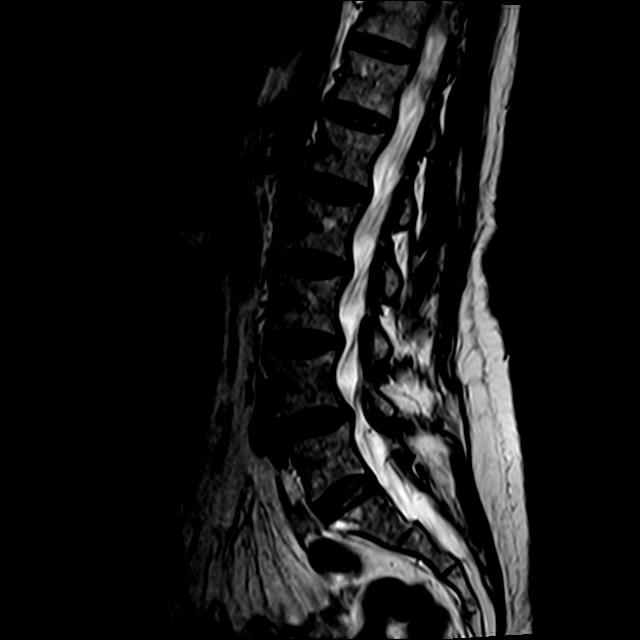
[im 8/14]
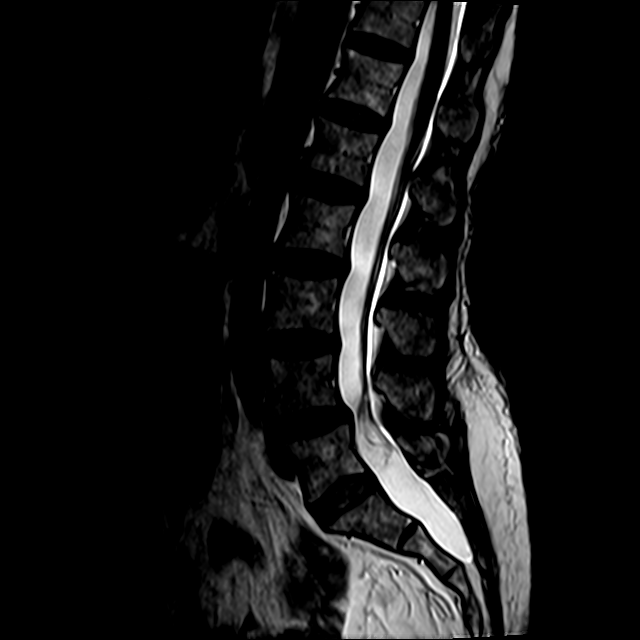
[im 11/14]
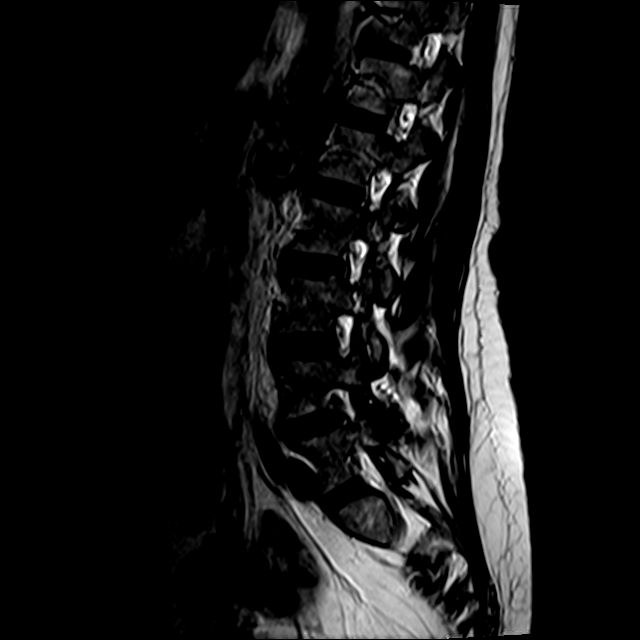
[im 14/14]
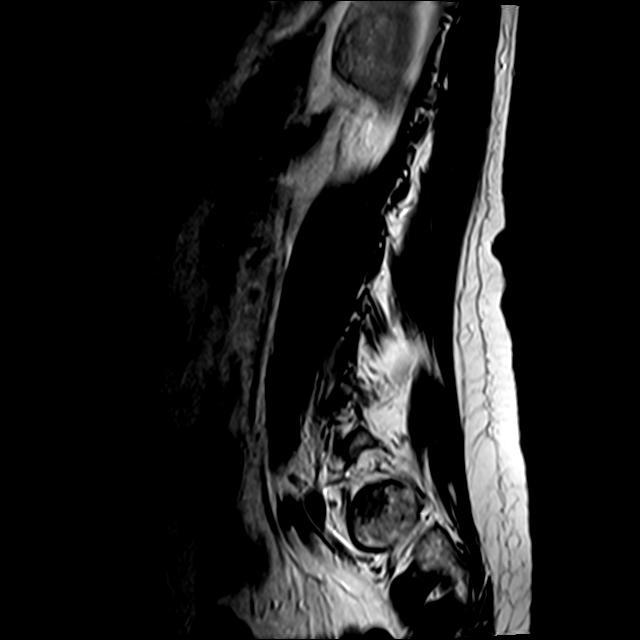

[Series 3: T1 · sagittal · 4.0mm · 0.55mm/px · 6 of 14 slices shown (1 of 2)]
[im 1/14]
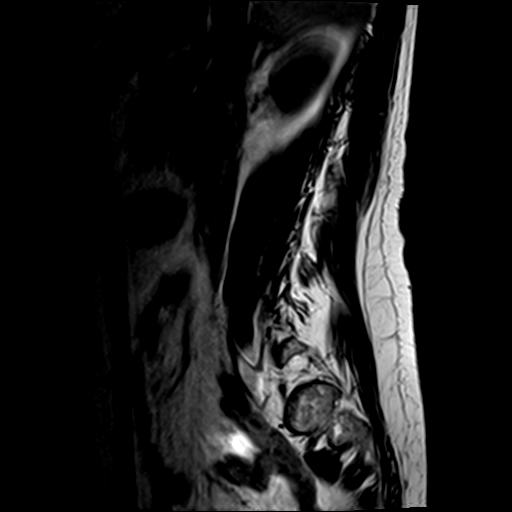
[im 3/14]
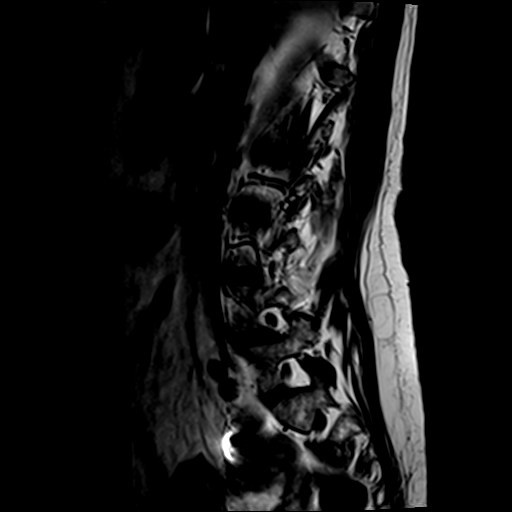
[im 6/14]
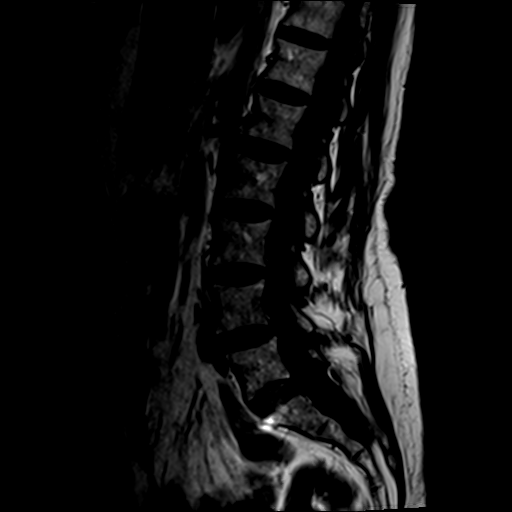
[im 8/14]
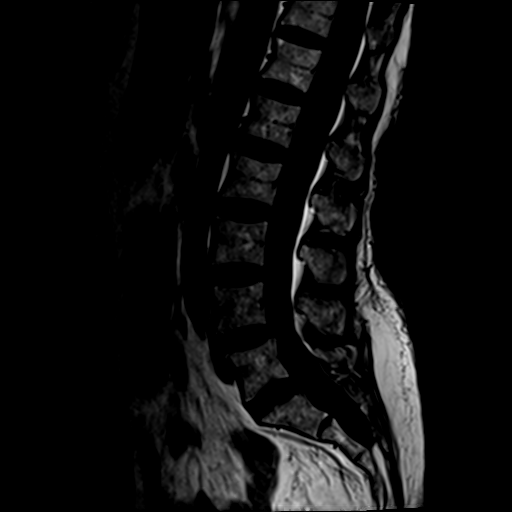
[im 11/14]
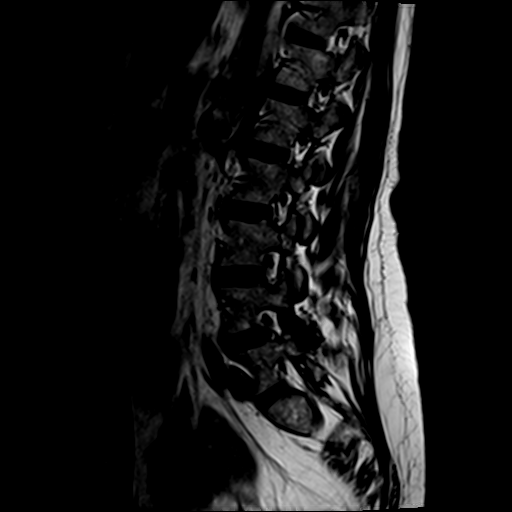
[im 14/14]
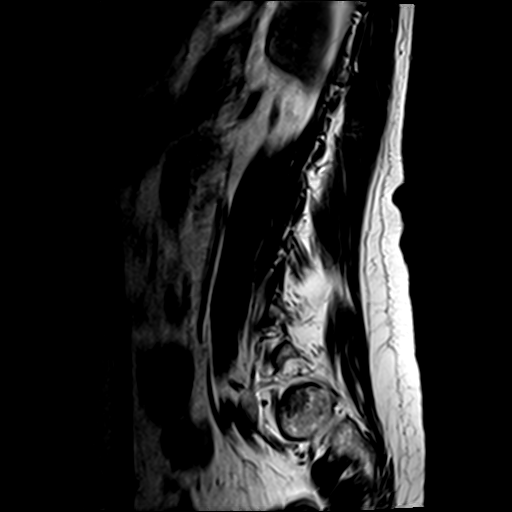

[Series 4: T2 · axial · 4.0mm · 0.74mm/px · z∈[-94,+79]mm · 9 of 36 slices shown (2 of 2)]
[im 1/36]
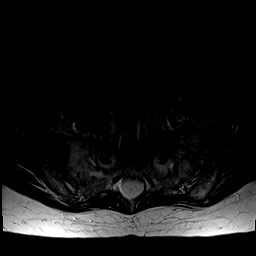
[im 6/36]
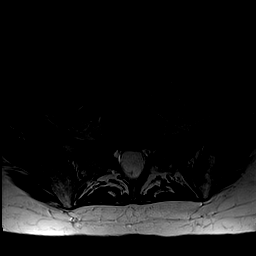
[im 11/36]
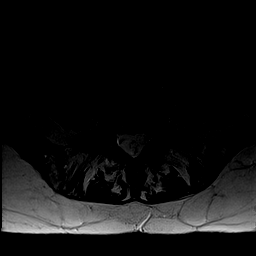
[im 16/36]
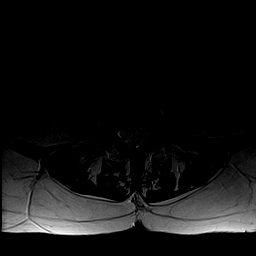
[im 18/36]
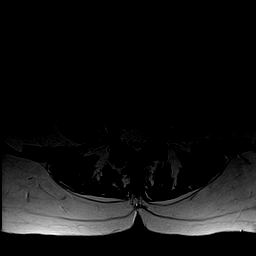
[im 21/36]
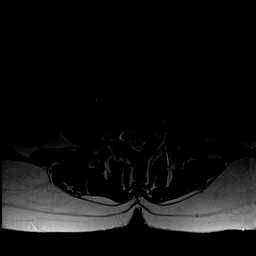
[im 26/36]
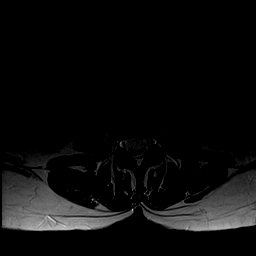
[im 31/36]
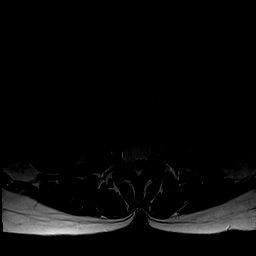
[im 36/36]
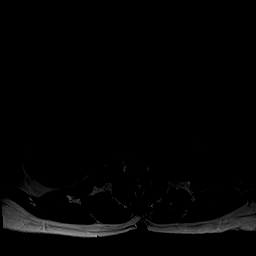

[Series 6: T1 · axial · 4.0mm · 0.74mm/px · z∈[-94,+54]mm · 7 of 36 slices shown (2 of 2)]
[im 1/36]
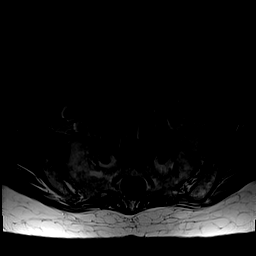
[im 6/36]
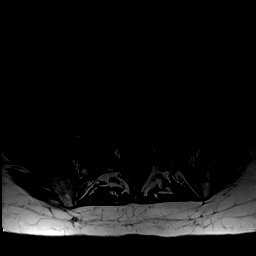
[im 11/36]
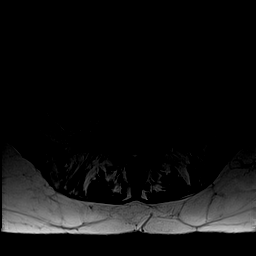
[im 16/36]
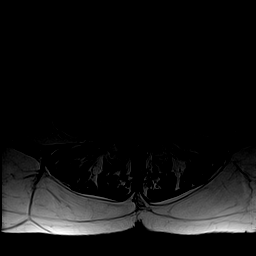
[im 18/36]
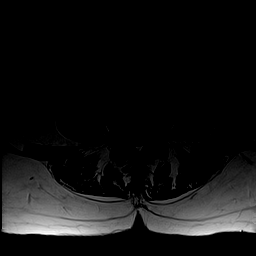
[im 21/36]
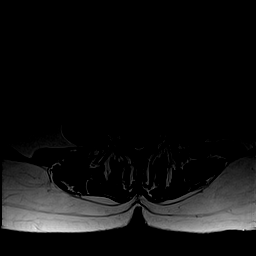
[im 31/36]
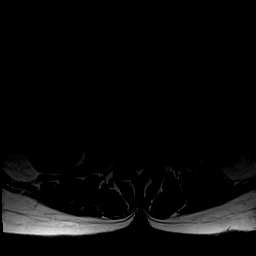

[28 of 48 positions shown; findings below may reference images not displayed]

FINDINGS: The lowest lumbar type non-rib-bearing vertebra is labeled as L5.
The conus medullaris appears normal. Conus level: L1.

4 mm degenerative grade 1 anterolisthesis at L4-5, no pars defects.

No significant vertebral marrow edema is identified. Additional
findings at individual levels are as follows:

L1-2:  No impingement.  Mild disc bulge.

L2-3:  No impingement.  Mild disc bulge.

L3-4: No impingement. Disc bulge and shallow right lateral recess
disc protrusion.

L4-5: Mild central narrowing of the thecal sac and borderline left
subarticular lateral recess stenosis due to disc bulge and left
greater than right facet arthropathy.

L5-S1:  Unremarkable.
IMPRESSION: 1. Mild lumbar spondylosis and degenerative disc disease, causing
mild central narrowing of the thecal sac at L4-5. There is also mild
degenerative anterolisthesis at this level.

## 2017-08-26 ENCOUNTER — Ambulatory Visit (INDEPENDENT_AMBULATORY_CARE_PROVIDER_SITE_OTHER): Payer: Medicare Other | Admitting: Urology

## 2017-08-26 DIAGNOSIS — R3915 Urgency of urination: Secondary | ICD-10-CM | POA: Diagnosis not present

## 2017-09-05 ENCOUNTER — Other Ambulatory Visit (HOSPITAL_COMMUNITY): Payer: Self-pay | Admitting: Pulmonary Disease

## 2017-09-05 DIAGNOSIS — Z1231 Encounter for screening mammogram for malignant neoplasm of breast: Secondary | ICD-10-CM

## 2017-09-12 ENCOUNTER — Ambulatory Visit (HOSPITAL_COMMUNITY)
Admission: RE | Admit: 2017-09-12 | Discharge: 2017-09-12 | Disposition: A | Discharge status: Home / Self Care | Payer: Medicare Other | Source: Ambulatory Visit | Attending: Pulmonary Disease | Admitting: Pulmonary Disease

## 2017-09-12 DIAGNOSIS — Z1231 Encounter for screening mammogram for malignant neoplasm of breast: Secondary | ICD-10-CM | POA: Diagnosis present

## 2017-10-07 ENCOUNTER — Ambulatory Visit: Payer: Medicare Other | Admitting: Urology

## 2017-10-07 DIAGNOSIS — N8111 Cystocele, midline: Secondary | ICD-10-CM

## 2017-10-07 DIAGNOSIS — R3915 Urgency of urination: Secondary | ICD-10-CM

## 2017-10-07 DIAGNOSIS — R3914 Feeling of incomplete bladder emptying: Secondary | ICD-10-CM

## 2017-11-10 ENCOUNTER — Other Ambulatory Visit: Payer: Self-pay

## 2017-11-10 ENCOUNTER — Encounter: Payer: Self-pay | Admitting: Obstetrics & Gynecology

## 2017-11-10 ENCOUNTER — Ambulatory Visit: Payer: Medicare Other | Admitting: Obstetrics & Gynecology

## 2017-11-10 VITALS — BP 142/86 | HR 92 | Ht 62.0 in | Wt 151.0 lb

## 2017-11-10 DIAGNOSIS — N952 Postmenopausal atrophic vaginitis: Secondary | ICD-10-CM | POA: Diagnosis not present

## 2017-11-10 DIAGNOSIS — N8110 Cystocele, unspecified: Secondary | ICD-10-CM | POA: Diagnosis not present

## 2017-11-10 DIAGNOSIS — N993 Prolapse of vaginal vault after hysterectomy: Secondary | ICD-10-CM | POA: Diagnosis not present

## 2017-11-10 NOTE — Progress Notes (Signed)
Chief Complaint  Patient presents with  . Follow-up      71 y.o. [id number] No LMP recorded. Patient has had a hysterectomy. The current method of family planning is status post hysterectomy.  Outpatient Encounter Medications as of 11/10/2017  Medication Sig Note  . levothyroxine (SYNTHROID, LEVOTHROID) 25 MCG tablet Take 25 mcg by mouth daily. 10/09/2013: Received from: External Pharmacy Received Sig:   . meloxicam (MOBIC) 15 MG tablet Take 15 mg by mouth daily.  09/13/2016: Received from: External Pharmacy  . pantoprazole (PROTONIX) 40 MG tablet Take 40 mg by mouth daily.   Marland Kitchen. therapeutic multivitamin-minerals (THERAGRAN-M) tablet Take 1 tablet by mouth daily.   Marland Kitchen. estradiol (ESTRACE VAGINAL) 0.1 MG/GM vaginal cream Place 1 Applicatorful vaginally at bedtime. (Patient not taking: Reported on 11/10/2017)    No facility-administered encounter medications on file as of 11/10/2017.     Subjective Gina Gibbs presents complaining of vaginal pressure and irritation now on going for months No severe pain no bleeding Not sexually active Symptoms continuous Past Medical History:  Diagnosis Date  . Arthritis   . Complication of anesthesia   . GERD (gastroesophageal reflux disease)   . PONV (postoperative nausea and vomiting)     Past Surgical History:  Procedure Laterality Date  . ABDOMINAL HYSTERECTOMY    . EAR CYST EXCISION  02/23/2012   Procedure: CYST REMOVAL;  Surgeon: Nicki ReaperGary R Kuzma, MD;  Location: [facility];  Service: Orthopedics;  Laterality: Left;  debridement distal interphalangeal left index finger, excision cyst/loose body  . FRACTURE SURGERY  09/2013   right ankle  . spinal cyst removal      OB History    Gravida  3   Para  2   Term  2   Preterm      AB  1   Living  2     SAB  1   TAB      Ectopic      Multiple      Live Births  2           Allergies  Allergen Reactions  . Tape     adhesives    Social History    Socioeconomic History  . Marital status: Married    Spouse name: Not on file  . Number of children: Not on file  . Years of education: Not on file  . Highest education level: Not on file  Occupational History  . Not on file  Social Needs  . Financial resource strain: Not on file  . Food insecurity:    Worry: Not on file    Inability: Not on file  . Transportation needs:    Medical: Not on file    Non-medical: Not on file  Tobacco Use  . Smoking status: Never Smoker  . Smokeless tobacco: Never Used  Substance and Sexual Activity  . Alcohol use: Yes    Comment: rare  . Drug use: No  . Sexual activity: Never    Partners: Male    Birth control/protection: Surgical    Comment: hyst  Lifestyle  . Physical activity:    Days per week: Not on file    Minutes per session: Not on file  . Stress: Not on file  Relationships  . Social connections:    Talks on phone: Not on file    Gets together: Not on file    Attends religious service: Not on file    Active member  of club or organization: Not on file    Attends meetings of clubs or organizations: Not on file    Relationship status: Not on file  Other Topics Concern  . Not on file  Social History Narrative  . Not on file    Family History  Problem Relation Age of Onset  . Diabetes Mother     Medications:       Current Outpatient Medications:  .  levothyroxine (SYNTHROID, LEVOTHROID) 25 MCG tablet, Take 25 mcg by mouth daily., Disp: , Rfl:  .  meloxicam (MOBIC) 15 MG tablet, Take 15 mg by mouth daily. , Disp: , Rfl:  .  pantoprazole (PROTONIX) 40 MG tablet, Take 40 mg by mouth daily., Disp: , Rfl:  .  therapeutic multivitamin-minerals (THERAGRAN-M) tablet, Take 1 tablet by mouth daily., Disp: , Rfl:  .  estradiol (ESTRACE VAGINAL) 0.1 MG/GM vaginal cream, Place 1 Applicatorful vaginally at bedtime. (Patient not taking: Reported on 11/10/2017), Disp: 42.5 g, Rfl: 12  Objective Blood pressure (!) 142/86, pulse 92,  height 5\' 2"  (1.575 m), weight 151 lb (68.5 kg).  General WDWN female NAD Vulva:  normal appearing vulva with no masses, tenderness or lesions Vagina:  atrophic Cervix:  absent Uterus:  uterus absent Adnexa: ovaries:not present,     Pertinent ROS No burning with urination, frequency or urgency No nausea, vomiting or diarrhea Nor fever chills or other constitutional symptoms   Labs or studies No new    Impression Diagnoses this Encounter::   ICD-10-CM   1. 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   Labs or Scans Ordered: No orders of the defined types were placed in this encounter.   Management:: Begin vaginal estrogen nightly  Follow up Return if symptoms worsen or fail to improve, for Follow up, with Dr Despina HiddenEure.        All questions were answered.

## 2018-06-05 ENCOUNTER — Encounter (HOSPITAL_COMMUNITY)
Admission: RE | Admit: 2018-06-05 | Discharge: 2018-06-05 | Disposition: A | Discharge status: Home / Self Care | Payer: Medicare Other | Source: Ambulatory Visit | Attending: Pulmonary Disease | Admitting: Pulmonary Disease

## 2018-06-05 ENCOUNTER — Other Ambulatory Visit (HOSPITAL_COMMUNITY)
Admission: RE | Admit: 2018-06-05 | Discharge: 2018-06-05 | Disposition: A | Discharge status: Home / Self Care | Payer: Medicare Other | Source: Ambulatory Visit | Attending: Pulmonary Disease | Admitting: Pulmonary Disease

## 2018-06-05 DIAGNOSIS — M810 Age-related osteoporosis without current pathological fracture: Secondary | ICD-10-CM | POA: Diagnosis present

## 2018-06-05 DIAGNOSIS — R739 Hyperglycemia, unspecified: Secondary | ICD-10-CM | POA: Diagnosis present

## 2018-06-05 LAB — COMPREHENSIVE METABOLIC PANEL
ALBUMIN: 4 g/dL (ref 3.5–5.0)
ALK PHOS: 53 U/L (ref 38–126)
ALT: 15 U/L (ref 0–44)
ANION GAP: 7 (ref 5–15)
AST: 16 U/L (ref 15–41)
BUN: 16 mg/dL (ref 8–23)
CHLORIDE: 109 mmol/L (ref 98–111)
CO2: 26 mmol/L (ref 22–32)
Calcium: 8.9 mg/dL (ref 8.9–10.3)
Creatinine, Ser: 0.75 mg/dL (ref 0.44–1.00)
GFR calc Af Amer: >60 mL/min (ref >60)
GFR calc non Af Amer: >60 mL/min (ref >60)
GLUCOSE: 115 mg/dL — AB (ref 70–99)
POTASSIUM: 3.6 mmol/L (ref 3.5–5.1)
Sodium: 142 mmol/L (ref 135–145)
TOTAL PROTEIN: 6.9 g/dL (ref 6.5–8.1)
Total Bilirubin: 1.2 mg/dL (ref 0.3–1.2)

## 2018-06-05 LAB — CBC WITH DIFFERENTIAL/PLATELET
BASOS ABS: 0 10*3/uL (ref 0.0–0.1)
Basophils Relative: 0 %
Eosinophils Absolute: 0.2 10*3/uL (ref 0.0–0.7)
Eosinophils Relative: 3 %
HEMATOCRIT: 42.8 % (ref 36.0–46.0)
Hemoglobin: 14.2 g/dL (ref 12.0–15.0)
LYMPHS PCT: 25 %
Lymphs Abs: 1.7 10*3/uL (ref 0.7–4.0)
MCH: 31.6 pg (ref 26.0–34.0)
MCHC: 33.2 g/dL (ref 30.0–36.0)
MCV: 95.3 fL (ref 78.0–100.0)
MONO ABS: 0.5 10*3/uL (ref 0.1–1.0)
MONOS PCT: 7 %
NEUTROS ABS: 4.5 10*3/uL (ref 1.7–7.7)
Neutrophils Relative %: 65 %
Platelets: 263 10*3/uL (ref 150–400)
RBC: 4.49 MIL/uL (ref 3.87–5.11)
RDW: 12.8 % (ref 11.5–15.5)
WBC: 6.9 10*3/uL (ref 4.0–10.5)

## 2018-06-05 LAB — LIPID PANEL
Cholesterol: 189 mg/dL (ref 0–200)
HDL: 48 mg/dL (ref >40)
LDL CALC: 90 mg/dL (ref 0–99)
Total CHOL/HDL Ratio: 3.9 RATIO
Triglycerides: 257 mg/dL — ABNORMAL HIGH (ref <150)
VLDL: 51 mg/dL — ABNORMAL HIGH (ref 0–40)

## 2018-06-05 LAB — RENAL FUNCTION PANEL
ANION GAP: 9 (ref 5–15)
Albumin: 4 g/dL (ref 3.5–5.0)
BUN: 16 mg/dL (ref 8–23)
CHLORIDE: 109 mmol/L (ref 98–111)
CO2: 25 mmol/L (ref 22–32)
Calcium: 8.9 mg/dL (ref 8.9–10.3)
Creatinine, Ser: 0.74 mg/dL (ref 0.44–1.00)
GFR calc non Af Amer: >60 mL/min (ref >60)
GLUCOSE: 111 mg/dL — AB (ref 70–99)
Phosphorus: 3.1 mg/dL (ref 2.5–4.6)
Potassium: 3.6 mmol/L (ref 3.5–5.1)
Sodium: 143 mmol/L (ref 135–145)

## 2018-06-05 LAB — TSH: TSH: 2.092 u[IU]/mL (ref 0.350–4.500)

## 2018-06-05 MED ORDER — SODIUM CHLORIDE 0.9 % IV SOLN
INTRAVENOUS | Status: DC
  Administered 2018-06-05: 250 mL via INTRAVENOUS

## 2018-06-05 MED ORDER — ZOLEDRONIC ACID 5 MG/100ML IV SOLN
5.0000 mg | Freq: Once | INTRAVENOUS | Status: AC
  Administered 2018-06-05: 5 mg via INTRAVENOUS
  Filled 2018-06-05: qty 100

## 2018-06-07 LAB — HEMOGLOBIN A1C
HEMOGLOBIN A1C: 5.4 % (ref 4.8–5.6)
Mean Plasma Glucose: 108 mg/dL

## 2018-06-07 NOTE — Progress Notes (Signed)
Results for Judyann MunsonSHARPE, Vallory P (MRN [id number]) as of 06/07/2018 10:42  Ref. Range 06/05/2018 09:43 06/05/2018 10:02 06/05/2018 10:02  COMPREHENSIVE METABOLIC PANEL Unknown  Rpt (A)   Sodium Latest Ref Range: 135 - 145 mmol/L  142 143  Potassium Latest Ref Range: 3.5 - 5.1 mmol/L  3.6 3.6  Chloride Latest Ref Range: 98 - 111 mmol/L  109 109  CO2 Latest Ref Range: 22 - 32 mmol/L  26 25  Glucose Latest Ref Range: 70 - 99 mg/dL  409115 (H) 811111 (H)  Mean Plasma Glucose Latest Units: mg/dL  914108   BUN Latest Ref Range: 8 - 23 mg/dL  16 16  Creatinine Latest Ref Range: 0.44 - 1.00 mg/dL  7.820.75 9.560.74  Calcium Latest Ref Range: 8.9 - 10.3 mg/dL  8.9 8.9  Anion gap Latest Ref Range: 5 - 15   7 9   Phosphorus Latest Ref Range: 2.5 - 4.6 mg/dL   3.1  Alkaline Phosphatase Latest Ref Range: 38 - 126 U/L  53   Albumin Latest Ref Range: 3.5 - 5.0 g/dL  4.0 4.0  AST Latest Ref Range: 15 - 41 U/L  16   ALT Latest Ref Range: 0 - 44 U/L  15   Total Protein Latest Ref Range: 6.5 - 8.1 g/dL  6.9   Total Bilirubin Latest Ref Range: 0.3 - 1.2 mg/dL  1.2   GFR, Est Non African American Latest Ref Range: >60 mL/min  >60 >60  GFR, Est African American Latest Ref Range: >60 mL/min  >60 >60  Total CHOL/HDL Ratio Latest Units: RATIO  3.9   Cholesterol Latest Ref Range: 0 - 200 mg/dL  213189   HDL Cholesterol Latest Ref Range: >40 mg/dL  48   LDL (calc) Latest Ref Range: 0 - 99 mg/dL  90   Triglycerides Latest Ref Range: <150 mg/dL  086257 (H)   VLDL Latest Ref Range: 0 - 40 mg/dL  51 (H)   WBC Latest Ref Range: 4.0 - 10.5 K/uL 6.9    RBC Latest Ref Range: 3.87 - 5.11 MIL/uL 4.49    Hemoglobin Latest Ref Range: 12.0 - 15.0 g/dL 57.814.2    HCT Latest Ref Range: 36.0 - 46.0 % 42.8    MCV Latest Ref Range: 78.0 - 100.0 fL 95.3    MCH Latest Ref Range: 26.0 - 34.0 pg 31.6    MCHC Latest Ref Range: 30.0 - 36.0 g/dL 46.933.2    RDW Latest Ref Range: 11.5 - 15.5 % 12.8    Platelets Latest Ref Range: 150 - 400 K/uL 263    Neutrophils Latest  Units: % 65    Lymphocytes Latest Units: % 25    Monocytes Relative Latest Units: % 7    Eosinophil Latest Units: % 3    Basophil Latest Units: % 0    NEUT# Latest Ref Range: 1.7 - 7.7 K/uL 4.5    Lymphocyte # Latest Ref Range: 0.7 - 4.0 K/uL 1.7    Monocyte # Latest Ref Range: 0.1 - 1.0 K/uL 0.5    Eosinophils Absolute Latest Ref Range: 0.0 - 0.7 K/uL 0.2    Basophils Absolute Latest Ref Range: 0.0 - 0.1 K/uL 0.0    Hemoglobin A1C Latest Ref Range: 4.8 - 5.6 %  5.4   TSH Latest Ref Range: 0.350 - 4.500 uIU/mL  2.092

## 2018-08-29 ENCOUNTER — Other Ambulatory Visit (HOSPITAL_COMMUNITY): Payer: Self-pay | Admitting: Pulmonary Disease

## 2018-08-29 DIAGNOSIS — Z1231 Encounter for screening mammogram for malignant neoplasm of breast: Secondary | ICD-10-CM

## 2018-09-13 ENCOUNTER — Ambulatory Visit (HOSPITAL_COMMUNITY)
Admission: RE | Admit: 2018-09-13 | Discharge: 2018-09-13 | Disposition: A | Discharge status: Home / Self Care | Payer: Medicare Other | Source: Ambulatory Visit | Attending: Pulmonary Disease | Admitting: Pulmonary Disease

## 2018-09-13 DIAGNOSIS — Z1231 Encounter for screening mammogram for malignant neoplasm of breast: Secondary | ICD-10-CM | POA: Diagnosis present

## 2018-09-13 LAB — HM MAMMOGRAPHY

## 2019-01-01 NOTE — Progress Notes (Signed)
[facility] - Clinic Note  01/03/2019     CHIEF COMPLAINT Patient presents for Retina Evaluation   HISTORY OF PRESENT ILLNESS: Gina Gibbs is a 73 y.o. female who presents to the clinic today for:   HPI    Retina Evaluation    In both eyes.  This started 2 months ago.  Associated Symptoms Flashes and Floaters.  Negative for Pain, Trauma, Fever, Weight Loss, Scalp Tenderness, Redness, Distortion, Photophobia, Jaw Claudication, Fatigue, Shoulder/Hip pain, Glare and Blind Spot.  Context:  distance vision, mid-range vision and near vision.  Treatments tried include surgery.  Response to treatment was no improvement.          Comments    Referral of Dr, Charise Killian for retina eval. Patient states she has been having flashes OD x 2mos and floaters Ou for the last 4-5 yrs, denies ocular pain. Pt rpeorts she had r a tear OD with laser  repair 04/03/2012 with Dr. Lelon Perla. Denies gtt's , she is taking multivitamin QD.       Last edited by Eldridge Scot, LPN on 1/61/0960  9:30 AM. (History)    pt states she was sent here by Dr. Daisy Lazar, she states she saw him for a routine exam, she states she seems to always have floaters in both eyes, she states she has a history of retinal tear in her right eye that Dr. Allyne Gee lasered back in 2013, pt states she has seen a few flashes in her left eye in the past 2 months, pt denies have high blood pressure or diabetes, but states she takes medication for thyroid disease  Referring physician: Daisy Lazar, DO [address] [place], [place] [address]  HISTORICAL INFORMATION:   Selected notes from the MEDICAL RECORD NUMBER Referred by Dr. Daisy Lazar for retinal hole OD LEE: 02.06.20 Fabienne Bruns) [BCVA: 20/20 OU] Ocular Hx-cellophane OD, ERM OD, PVD OD, retinal hole OD PMH-arthritis    CURRENT MEDICATIONS: Current Outpatient Medications (Ophthalmic Drugs)  Medication Sig  . prednisoLONE acetate (PRED FORTE) 1 % ophthalmic  suspension Place 1 drop into the left eye 4 (four) times daily for 7 days.   No current facility-administered medications for this visit.  (Ophthalmic Drugs)   Current Outpatient Medications (Other)  Medication Sig  . estradiol (ESTRACE VAGINAL) 0.1 MG/GM vaginal cream Place 1 Applicatorful vaginally at bedtime.  Marland Kitchen levothyroxine (SYNTHROID, LEVOTHROID) 25 MCG tablet Take 25 mcg by mouth daily.  . meloxicam (MOBIC) 15 MG tablet Take 15 mg by mouth daily.   . pantoprazole (PROTONIX) 40 MG tablet Take 40 mg by mouth daily.  Marland Kitchen therapeutic multivitamin-minerals (THERAGRAN-M) tablet Take 1 tablet by mouth daily.   No current facility-administered medications for this visit.  (Other)      REVIEW OF SYSTEMS: ROS    Positive for: Eyes   Negative for: Constitutional, Gastrointestinal, Neurological, Skin, Genitourinary, Musculoskeletal, HENT, Endocrine, Cardiovascular, Respiratory, Psychiatric, Allergic/Imm, Heme/Lymph   Last edited by Eldridge Scot, LPN on 07/02/9146  9:06 AM. (History)       ALLERGIES Allergies  Allergen Reactions  . Tape     adhesives    PAST MEDICAL HISTORY Past Medical History:  Diagnosis Date  . Arthritis   . Complication of anesthesia   . GERD (gastroesophageal reflux disease)   . PONV (postoperative nausea and vomiting)    Past Surgical History:  Procedure Laterality Date  . ABDOMINAL HYSTERECTOMY    . EAR CYST EXCISION  02/23/2012   Procedure: CYST REMOVAL;  Surgeon: Nicki Reaper, MD;  Location: [facility];  Service: Orthopedics;  Laterality: Left;  debridement distal interphalangeal left index finger, excision cyst/loose body  . FRACTURE SURGERY  09/2013   right ankle  . spinal cyst removal      FAMILY HISTORY Family History  Problem Relation Age of Onset  . Diabetes Mother     SOCIAL HISTORY Social History   Tobacco Use  . Smoking status: Never Smoker  . Smokeless tobacco: Never Used  Substance Use Topics  . Alcohol use:  Yes    Comment: rare  . Drug use: No         OPHTHALMIC EXAM:  Base Eye Exam    Visual Acuity (Snellen - Linear)      Right Left   Dist cc 20/20 20/40 +2   Dist ph cc NI 20/20   Correction:  Glasses       Tonometry (Tonopen, 9:25 AM)      Right Left   Pressure 12 16       Pupils      Dark Light Shape React APD   Right 3 2 Round Brisk None   Left 3 2 Round Brisk None       Visual Fields (Counting fingers)      Left Right    Full Full       Extraocular Movement      Right Left    Full, Ortho Full, Ortho       Neuro/Psych    Oriented x3:  Yes       Dilation    Both eyes:  1.0% Mydriacyl, 2.5% Phenylephrine @ 9:25 AM        Slit Lamp and Fundus Exam    Slit Lamp Exam      Right Left   Lids/Lashes Dermatochalasis - upper lid Dermatochalasis - upper lid   Conjunctiva/Sclera White and quiet White and quiet   Cornea 1+ Punctate epithelial erosions 1+ Punctate epithelial erosions   Anterior Chamber Deep and quiet Deep and quiet   Iris Round and dilated Round and dilated   Lens 2+ Nuclear sclerosis, 2+ Cortical cataract 2+ Nuclear sclerosis, 2+ Cortical cataract   Vitreous Vitreous syneresis, Posterior vitreous detachment Vitreous syneresis, no tobacco dusting, Posterior vitreous detachment       Fundus Exam      Right Left   Disc Pink and Sharp Pink and Sharp   C/D Ratio 0.3 0.2   Macula Blunted foveal reflex, No heme or edema, Drusen superior macula Good foveal reflex, Epiretinal membrane, Retinal pigment epithelial mottling, Drusen superior macula   Vessels mild Vascular attenuation, mild Tortuousity mild Vascular attenuation, mild Tortuousity   Periphery Attached, pigmented CR scar at 0330, inferior cobblestoning at 0600 with CR scars, operculated hole with surrounding laser scars at 1030, scattered reticular degeneration Attached, HST at 0230 with pigmented demarcation line, no SRF, pigmented peripheral cystoid degeneration          IMAGING AND  PROCEDURES  Imaging and Procedures for @TODAY @  OCT, Retina - OU - Both Eyes       Right Eye Quality was good. Central Foveal Thickness: 305. Progression has no prior data. Findings include normal foveal contour, no IRF, no SRF, epiretinal membrane, retinal drusen .   Left Eye Central Foveal Thickness: 313. Progression has no prior data. Findings include normal foveal contour, no IRF, no SRF, epiretinal membrane, macular pucker, retinal drusen .   Notes *Images captured and stored on drive  Diagnosis / Impression:  NFP; no IRF/SRF OU ERM OU (OS > OD) Retinal drusen OU  Clinical management:  See below  Abbreviations: NFP - Normal foveal profile. CME - cystoid macular edema. PED - pigment epithelial detachment. IRF - intraretinal fluid. SRF - subretinal fluid. EZ - ellipsoid zone. ERM - epiretinal membrane. ORA - outer retinal atrophy. ORT - outer retinal tubulation. SRHM - subretinal hyper-reflective material        Repair Retinal Breaks, Laser - OS - Left Eye       LASER PROCEDURE NOTE  Procedure:  Barrier laser retinopexy using slit lamp laser, LEFT eye   Diagnosis:   Retinal tear, LEFT eye                     Flap tear at 230 o'clock anterior to equator   Surgeon: Rennis Chris, MD, PhD  Anesthesia: Topical  Informed consent obtained, operative eye marked, and time out performed prior to initiation of laser.   Laser settings:  Lumenis Smart532 laser, slit lamp Lens: Mainster PRP 165 Power: 300 mW Spot size: 200 microns Duration: 30 msec  # spots: 281  Placement of laser: Using a Mainster PRP 165 contact lens at the slit lamp, laser was placed in three confluent rows around flap tear at 230 oclock anterior to equator. Indirect laser ophthalmoscopy with scleral depression was performed to complete the anterior portion of the laser -- 340 spots, 250 mW power, 100 ms duration.  Complications: None.  Patient tolerated the procedure well and received written and  verbal post-procedure care information/education.                  ASSESSMENT/PLAN:    ICD-10-CM   1. Retinal tear of left eye H33.312 Repair Retinal Breaks, Laser - OS - Left Eye  2. History of repair of retinal tear by laser photocoagulation Z98.890   3. Epiretinal membrane (ERM) of both eyes H35.373   4. Retinal edema H35.81 OCT, Retina - OU - Both Eyes  5. Early dry stage nonexudative age-related macular degeneration of both eyes H35.3131   6. Combined forms of age-related cataract of both eyes H25.813    1. Retinal tear, OS     - The incidence, risk factors, and natural history of retinal tear was discussed with patient.    - Potential treatment options including laser retinopexy and cryotherapy discussed with patient.  - tear located at 0230 with pigmented demarcation line, but no SRF  - recommend laser retinopexy today, 02.12.20  - pt wishes to proceed  - RBA of procedure discussed, questions answered  - informed consent obtained and signed  - see procedure note  - start PF QID x7 days  - f/u in 1-2 wks  2. History of retinal tear OD  - operculated hole at 1030  - s/p laser retinopexy w/ Dr. Stephannie Li 2013  - stable  3,4. Epiretinal membrane, OU (OS>OD)  - The natural history, anatomy, potential for loss of vision, and treatment options including vitrectomy techniques and the complications of endophthalmitis, retinal detachment, vitreous hemorrhage, cataract progression and permanent vision loss discussed with the patient.  - OS with macular pucker -- BCVA 20/20 OU  - mild ERM OU  - asymptomatic, no metamorphopsia  - no indication for surgery at this time  - monitor for now  5. Age related macular degeneration, non-exudative, both eyes  - early stage  - The incidence, anatomy, and pathology of dry AMD, risk of progression,  and the AREDS and AREDS 2 study including smoking risks discussed with patient.  - Recommend amsler grid monitoring  6. Mixed form  age related cataract OU  - The symptoms of cataract, surgical options, and treatments and risks were discussed with patient.  - discussed diagnosis and progression  - not yet visually significant  - monitor for now   Ophthalmic Meds Ordered this visit:  Meds ordered this encounter  Medications  . prednisoLONE acetate (PRED FORTE) 1 % ophthalmic suspension    Sig: Place 1 drop into the left eye 4 (four) times daily for 7 days.    Dispense:  10 mL    Refill:  0       Return in about 2 weeks (around 01/17/2019) for POV.  There are no Patient Instructions on file for this visit.   Explained the diagnoses, plan, and follow up with the patient and they expressed understanding.  Patient expressed understanding of the importance of proper follow up care.   This document serves as a record of services personally performed by Karie ChimeraBrian G. Treonna Klee, MD, PhD. It was created on their behalf by Laurian BrimAmanda Brown, OA, an ophthalmic assistant. The creation of this record is the provider's dictation and/or activities during the visit.    Electronically signed by: Laurian BrimAmanda Brown, OA  02.10.2020 3:27 PM    Karie ChimeraBrian G. Mayce Noyes, M.D., Ph.D. Diseases & Surgery of the Retina and Vitreous [facility]   I have reviewed the above documentation for accuracy and completeness, and I agree with the above. Karie ChimeraBrian G. Dory Demont, M.D., Ph.D. 01/06/19 3:30 PM    Abbreviations: M myopia (nearsighted); A astigmatism; H hyperopia (farsighted); P presbyopia; Mrx spectacle prescription;  CTL contact lenses; OD right eye; OS left eye; OU both eyes  XT exotropia; ET esotropia; PEK punctate epithelial keratitis; PEE punctate epithelial erosions; DES dry eye syndrome; MGD meibomian gland dysfunction; ATs artificial tears; PFAT's preservative free artificial tears; NSC nuclear sclerotic cataract; PSC posterior subcapsular cataract; ERM epi-retinal membrane; PVD posterior vitreous detachment; RD retinal detachment; DM  diabetes mellitus; DR diabetic retinopathy; NPDR non-proliferative diabetic retinopathy; PDR proliferative diabetic retinopathy; CSME clinically significant macular edema; DME diabetic macular edema; dbh dot blot hemorrhages; CWS cotton wool spot; POAG primary open angle glaucoma; C/D cup-to-disc ratio; HVF humphrey visual field; GVF goldmann visual field; OCT optical coherence tomography; IOP intraocular pressure; BRVO Branch retinal vein occlusion; CRVO central retinal vein occlusion; CRAO central retinal artery occlusion; BRAO branch retinal artery occlusion; RT retinal tear; SB scleral buckle; PPV pars plana vitrectomy; VH Vitreous hemorrhage; PRP panretinal laser photocoagulation; IVK intravitreal kenalog; VMT vitreomacular traction; MH Macular hole;  NVD neovascularization of the disc; NVE neovascularization elsewhere; AREDS age related eye disease study; ARMD age related macular degeneration; POAG primary open angle glaucoma; EBMD epithelial/anterior basement membrane dystrophy; ACIOL anterior chamber intraocular lens; IOL intraocular lens; PCIOL posterior chamber intraocular lens; Phaco/IOL phacoemulsification with intraocular lens placement; PRK photorefractive keratectomy; LASIK laser assisted in situ keratomileusis; HTN hypertension; DM diabetes mellitus; COPD chronic obstructive pulmonary disease

## 2019-01-03 ENCOUNTER — Encounter (INDEPENDENT_AMBULATORY_CARE_PROVIDER_SITE_OTHER): Payer: Self-pay | Admitting: Ophthalmology

## 2019-01-03 ENCOUNTER — Ambulatory Visit (INDEPENDENT_AMBULATORY_CARE_PROVIDER_SITE_OTHER): Payer: Medicare Other | Admitting: Ophthalmology

## 2019-01-03 DIAGNOSIS — H3581 Retinal edema: Secondary | ICD-10-CM | POA: Diagnosis not present

## 2019-01-03 DIAGNOSIS — H353131 Nonexudative age-related macular degeneration, bilateral, early dry stage: Secondary | ICD-10-CM

## 2019-01-03 DIAGNOSIS — Z9889 Other specified postprocedural states: Secondary | ICD-10-CM | POA: Diagnosis not present

## 2019-01-03 DIAGNOSIS — H33312 Horseshoe tear of retina without detachment, left eye: Secondary | ICD-10-CM | POA: Diagnosis not present

## 2019-01-03 DIAGNOSIS — H25813 Combined forms of age-related cataract, bilateral: Secondary | ICD-10-CM

## 2019-01-03 DIAGNOSIS — H35373 Puckering of macula, bilateral: Secondary | ICD-10-CM

## 2019-01-03 MED ORDER — PREDNISOLONE ACETATE 1 % OP SUSP: 1.0000 [drp] | Freq: Four times a day (QID) | OPHTHALMIC | 0 refills | Status: AC

## 2019-01-03 NOTE — Progress Notes (Deleted)
ERM OU (OS > OD)  OS w/ pucker Mild Drusen OU

## 2019-01-04 ENCOUNTER — Encounter (INDEPENDENT_AMBULATORY_CARE_PROVIDER_SITE_OTHER): Payer: Self-pay | Admitting: Ophthalmology

## 2019-01-15 NOTE — Progress Notes (Signed)
[facility] - Clinic Note  01/17/2019     CHIEF COMPLAINT Patient presents for Retina Follow Up   HISTORY OF PRESENT ILLNESS: Gina Gibbs is a 73 y.o. female who presents to the clinic today for:   HPI    Retina Follow Up    Patient presents with  Retinal Break/Detachment.  In left eye.  This started 2 weeks ago.  Severity is moderate.  Duration of 2 weeks.  Since onset it is stable.  I, the attending physician,  performed the HPI with the patient and updated documentation appropriately.          Comments    Patient here for 2 week follow up for retinal tear OS (S/P laser 02.12). Patient states vision doing ok . Doing fine. Didn't have eye pain. Day after laser other eye OD was bloodshot whole side and bottom of OD. Felt like a pulled muscle. OD was ok.        Last edited by Rennis Chris, MD on 01/21/2019 10:38 PM. (History)    pt denies new flashes/floaters  Referring physician: Kari Baars, MD [address] [place], [place] [address]  HISTORICAL INFORMATION:   Selected notes from the MEDICAL RECORD NUMBER Referred by Dr. Daisy Lazar for retinal hole OD LEE: 02.06.20 Judie Petit. [place]) [BCVA: 20/20 OU] Ocular Hx-cellophane OD, ERM OD, PVD OD, retinal hole OD PMH-arthritis    CURRENT MEDICATIONS: No current outpatient medications on file. (Ophthalmic Drugs)   No current facility-administered medications for this visit.  (Ophthalmic Drugs)   Current Outpatient Medications (Other)  Medication Sig  . estradiol (ESTRACE VAGINAL) 0.1 MG/GM vaginal cream Place 1 Applicatorful vaginally at bedtime.  Marland Kitchen levothyroxine (SYNTHROID, LEVOTHROID) 25 MCG tablet Take 25 mcg by mouth daily.  . meloxicam (MOBIC) 15 MG tablet Take 15 mg by mouth daily.   . pantoprazole (PROTONIX) 40 MG tablet Take 40 mg by mouth daily.  Marland Kitchen therapeutic multivitamin-minerals (THERAGRAN-M) tablet Take 1 tablet by mouth daily.   No current facility-administered medications for this  visit.  (Other)      REVIEW OF SYSTEMS: ROS    Positive for: Eyes   Negative for: Constitutional, Gastrointestinal, Neurological, Skin, Genitourinary, Musculoskeletal, HENT, Endocrine, Cardiovascular, Respiratory, Psychiatric, Allergic/Imm, Heme/Lymph   Last edited by Laddie Aquas on 01/17/2019  8:58 AM. (History)       ALLERGIES Allergies  Allergen Reactions  . Tape     adhesives    PAST MEDICAL HISTORY Past Medical History:  Diagnosis Date  . Arthritis   . Complication of anesthesia   . GERD (gastroesophageal reflux disease)   . PONV (postoperative nausea and vomiting)    Past Surgical History:  Procedure Laterality Date  . ABDOMINAL HYSTERECTOMY    . EAR CYST EXCISION  02/23/2012   Procedure: CYST REMOVAL;  Surgeon: Nicki Reaper, MD;  Location: [facility];  Service: Orthopedics;  Laterality: Left;  debridement distal interphalangeal left index finger, excision cyst/loose body  . FRACTURE SURGERY  09/2013   right ankle  . spinal cyst removal      FAMILY HISTORY Family History  Problem Relation Age of Onset  . Diabetes Mother     SOCIAL HISTORY Social History   Tobacco Use  . Smoking status: Never Smoker  . Smokeless tobacco: Never Used  Substance Use Topics  . Alcohol use: Yes    Comment: rare  . Drug use: No         OPHTHALMIC EXAM:  Base  Eye Exam    Visual Acuity (Snellen - Linear)      Right Left   Dist cc 20/20 -1 20/30   Dist ph cc  20/20 -1   Correction:  Glasses       Tonometry (Tonopen, 8:53 AM)      Right Left   Pressure 15 12       Pupils      Dark Light Shape React APD   Right 3 2 Round Brisk None   Left 3 2 Round Brisk None       Visual Fields (Counting fingers)      Left Right    Full Full       Extraocular Movement      Right Left    Full, Ortho Full, Ortho       Neuro/Psych    Oriented x3:  Yes   Mood/Affect:  Normal       Dilation    Both eyes:  1.0% Mydriacyl, 2.5% Phenylephrine @ 8:53  AM        Slit Lamp and Fundus Exam    Slit Lamp Exam      Right Left   Lids/Lashes Dermatochalasis - upper lid Dermatochalasis - upper lid   Conjunctiva/Sclera White and quiet White and quiet   Cornea 2+ Punctate epithelial erosions 2+ Punctate epithelial erosions   Anterior Chamber Deep and quiet Deep and quiet   Iris Round and dilated Round and dilated   Lens 2+ Nuclear sclerosis, 2+ Cortical cataract 2+ Nuclear sclerosis, 2+ Cortical cataract   Vitreous Vitreous syneresis, Posterior vitreous detachment Vitreous syneresis, Posterior vitreous detachment       Fundus Exam      Right Left   Disc Pink and Sharp Pink and Sharp   C/D Ratio 0.3 0.2   Macula Blunted foveal reflex, No heme or edema, Drusen superior macula blunted foveal reflex, Epiretinal membrane, Retinal pigment epithelial mottling, Drusen superior macula   Vessels mild Vascular attenuation, mild Tortuousity mild Vascular attenuation, mild Tortuousity   Periphery Attached, pigmented CR scar at 0330, inferior cobblestoning at 0600 with CR scars, operculated hole with surrounding laser scars at 1030, scattered reticular degeneration Attached, HST at 0230 with pigmented demarcation line - excellent laser surrounding, no SRF, pigmented peripheral cystoid degeneration, fading blot heme at 0600 equator          IMAGING AND PROCEDURES  Imaging and Procedures for @  OCT, Retina - OU - Both Eyes       Right Eye Quality was good. Central Foveal Thickness: 307. Progression has been stable. Findings include normal foveal contour, no IRF, no SRF, epiretinal membrane, retinal drusen .   Left Eye Quality was good. Central Foveal Thickness: 323. Progression has been stable. Findings include normal foveal contour, no IRF, no SRF, epiretinal membrane, macular pucker, retinal drusen .   Notes *Images captured and stored on drive  Diagnosis / Impression:  NFP; no IRF/SRF OU ERM OU (OS > OD) Retinal drusen OU  Clinical  management:  See below  Abbreviations: NFP - Normal foveal profile. CME - cystoid macular edema. PED - pigment epithelial detachment. IRF - intraretinal fluid. SRF - subretinal fluid. EZ - ellipsoid zone. ERM - epiretinal membrane. ORA - outer retinal atrophy. ORT - outer retinal tubulation. SRHM - subretinal hyper-reflective material                 ASSESSMENT/PLAN:    ICD-10-CM   1. Retinal tear of left eye H33.312  2. History of repair of retinal tear by laser photocoagulation Z98.890   3. Epiretinal membrane (ERM) of both eyes H35.373   4. Retinal edema H35.81 OCT, Retina - OU - Both Eyes  5. Early dry stage nonexudative age-related macular degeneration of both eyes H35.3131   6. Combined forms of age-related cataract of both eyes H25.813    1. Retinal tear, OS     - tear located at 0230 with pigmented demarcation line, but no SRF  - s/p laser retinopexy (02.12.20) -- good laser in place  - completed PF QID x7 days  - f/u in 3 months  2. History of retinal tear OD  - operculated hole at 1030  - s/p laser retinopexy w/ Dr. Stephannie Li 2013  - stable  3,4. Epiretinal membrane, OU (OS>OD)  - The natural history, anatomy, potential for loss of vision, and treatment options including vitrectomy techniques and the complications of endophthalmitis, retinal detachment, vitreous hemorrhage, cataract progression and permanent vision loss discussed with the patient.  - OS with macular pucker -- BCVA 20/20 OU  - mild ERM OU  - asymptomatic, no metamorphopsia  - no indication for surgery at this time  - monitor for now  5. Age related macular degeneration, non-exudative, both eyes  - early stage  - The incidence, anatomy, and pathology of dry AMD, risk of progression, and the AREDS and AREDS 2 study including smoking risks discussed with patient.  - recommend amsler grid monitoring  6. 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  - discussed diagnosis and progression  - not yet visually significant  - monitor for now   Ophthalmic Meds Ordered this visit:  No orders of the defined types were placed in this encounter.      Return in about 3 months (around 04/17/2019) for f/u retinal tear OS, DFE, OCT.  There are no Patient Instructions on file for this visit.   Explained the diagnoses, plan, and follow up with the patient and they expressed understanding.  Patient expressed understanding of the importance of proper follow up care.   This document serves as a record of services personally performed by Karie Chimera, MD, PhD. It was created on their behalf by Laurian Brim, OA, an ophthalmic assistant. The creation of this record is the provider's dictation and/or activities during the visit.    Electronically signed by: Laurian Brim, OA  02.24.2020 10:39 PM    Karie Chimera, M.D., Ph.D. Diseases & Surgery of the Retina and Vitreous [facility]  I have reviewed the above documentation for accuracy and completeness, and I agree with the above. Karie Chimera, M.D., Ph.D. 01/21/19 10:40 PM      Abbreviations: M myopia (nearsighted); A astigmatism; H hyperopia (farsighted); P presbyopia; Mrx spectacle prescription;  CTL contact lenses; OD right eye; OS left eye; OU both eyes  XT exotropia; ET esotropia; PEK punctate epithelial keratitis; PEE punctate epithelial erosions; DES dry eye syndrome; MGD meibomian gland dysfunction; ATs artificial tears; PFAT's preservative free artificial tears; NSC nuclear sclerotic cataract; PSC posterior subcapsular cataract; ERM epi-retinal membrane; PVD posterior vitreous detachment; RD retinal detachment; DM diabetes mellitus; DR diabetic retinopathy; NPDR non-proliferative diabetic retinopathy; PDR proliferative diabetic retinopathy; CSME clinically significant macular edema; DME diabetic macular edema;  dbh dot blot hemorrhages; CWS cotton wool spot; POAG primary open angle glaucoma; C/D cup-to-disc ratio; HVF humphrey visual field;  GVF goldmann visual field; OCT optical coherence tomography; IOP intraocular pressure; BRVO Branch retinal vein occlusion; CRVO central retinal vein occlusion; CRAO central retinal artery occlusion; BRAO branch retinal artery occlusion; RT retinal tear; SB scleral buckle; PPV pars plana vitrectomy; VH Vitreous hemorrhage; PRP panretinal laser photocoagulation; IVK intravitreal kenalog; VMT vitreomacular traction; MH Macular hole;  NVD neovascularization of the disc; NVE neovascularization elsewhere; AREDS age related eye disease study; ARMD age related macular degeneration; POAG primary open angle glaucoma; EBMD epithelial/anterior basement membrane dystrophy; ACIOL anterior chamber intraocular lens; IOL intraocular lens; PCIOL posterior chamber intraocular lens; Phaco/IOL phacoemulsification with intraocular lens placement; [place] photorefractive keratectomy; LASIK laser assisted in situ keratomileusis; HTN hypertension; DM diabetes mellitus; COPD chronic obstructive pulmonary disease

## 2019-01-17 ENCOUNTER — Ambulatory Visit (INDEPENDENT_AMBULATORY_CARE_PROVIDER_SITE_OTHER): Payer: Medicare Other | Admitting: Ophthalmology

## 2019-01-17 DIAGNOSIS — H3581 Retinal edema: Secondary | ICD-10-CM | POA: Diagnosis not present

## 2019-01-17 DIAGNOSIS — H35373 Puckering of macula, bilateral: Secondary | ICD-10-CM

## 2019-01-17 DIAGNOSIS — H353131 Nonexudative age-related macular degeneration, bilateral, early dry stage: Secondary | ICD-10-CM

## 2019-01-17 DIAGNOSIS — Z9889 Other specified postprocedural states: Secondary | ICD-10-CM

## 2019-01-17 DIAGNOSIS — H33312 Horseshoe tear of retina without detachment, left eye: Secondary | ICD-10-CM

## 2019-01-17 DIAGNOSIS — H25813 Combined forms of age-related cataract, bilateral: Secondary | ICD-10-CM

## 2019-01-21 ENCOUNTER — Encounter (INDEPENDENT_AMBULATORY_CARE_PROVIDER_SITE_OTHER): Payer: Self-pay | Admitting: Ophthalmology

## 2019-04-16 NOTE — Progress Notes (Signed)
[facility] - Clinic Note  04/17/2019     CHIEF COMPLAINT Patient presents for Retina Follow Up   HISTORY OF PRESENT ILLNESS: Gina Gibbs is a 73 y.o. female who presents to the clinic today for:   HPI    Retina Follow Up    Patient presents with  Other.  In left eye.  This started 3 months ago.  Severity is moderate.  I, the attending physician,  performed the HPI with the patient and updated documentation appropriately.          Comments    Patient here for 3 month retina follow up for retinal tears OS. Patient states vision doing fine. No eye pain.       Last edited by Rennis ChrisZamora, Cynthia Stainback, MD on 04/17/2019 12:09 PM. (History)    pt she is doing well, she states about 3 weeks ago she developed a stye, she states she took doxycycline for about 10 days and did warm compresses, but it has not gone away, she denies new flashes/floaters  Referring physician: Kari BaarsHawkins, Edward, MD [address] [place], [place] [id number]  HISTORICAL INFORMATION:   Selected notes from the MEDICAL RECORD NUMBER Referred by Dr. Daisy LazarMark Cotter for retinal hole OD LEE: 02.06.20 Fabienne Bruns(M. Cotter) [BCVA: 20/20 OU] Ocular Hx-cellophane OD, ERM OD, PVD OD, retinal hole OD PMH-arthritis    CURRENT MEDICATIONS: No current outpatient medications on file. (Ophthalmic Drugs)   No current facility-administered medications for this visit.  (Ophthalmic Drugs)   Current Outpatient Medications (Other)  Medication Sig  . estradiol (ESTRACE VAGINAL) 0.1 MG/GM vaginal cream Place 1 Applicatorful vaginally at bedtime.  Marland Kitchen. levothyroxine (SYNTHROID, LEVOTHROID) 25 MCG tablet Take 25 mcg by mouth daily.  . meloxicam (MOBIC) 15 MG tablet Take 15 mg by mouth daily.   . pantoprazole (PROTONIX) 40 MG tablet Take 40 mg by mouth daily.  Marland Kitchen. therapeutic multivitamin-minerals (THERAGRAN-M) tablet Take 1 tablet by mouth daily.   No current facility-administered medications for this visit.  (Other)      REVIEW OF  SYSTEMS: ROS    Positive for: Eyes   Negative for: Constitutional, Gastrointestinal, Neurological, Skin, Genitourinary, Musculoskeletal, HENT, Endocrine, Cardiovascular, Respiratory, Psychiatric, Allergic/Imm, Heme/Lymph   Last edited by Laddie Aquaslarke, Rebecca S on 04/17/2019  9:17 AM. (History)       ALLERGIES Allergies  Allergen Reactions  . Tape     adhesives    PAST MEDICAL HISTORY Past Medical History:  Diagnosis Date  . Arthritis   . Complication of anesthesia   . GERD (gastroesophageal reflux disease)   . PONV (postoperative nausea and vomiting)    Past Surgical History:  Procedure Laterality Date  . ABDOMINAL HYSTERECTOMY    . EAR CYST EXCISION  02/23/2012   Procedure: CYST REMOVAL;  Surgeon: Nicki ReaperGary R Kuzma, MD;  Location: [facility];  Service: Orthopedics;  Laterality: Left;  debridement distal interphalangeal left index finger, excision cyst/loose body  . FRACTURE SURGERY  09/2013   right ankle  . spinal cyst removal      FAMILY HISTORY Family History  Problem Relation Age of Onset  . Diabetes Mother     SOCIAL HISTORY Social History   Tobacco Use  . Smoking status: Never Smoker  . Smokeless tobacco: Never Used  Substance Use Topics  . Alcohol use: Yes    Comment: rare  . Drug use: No         OPHTHALMIC EXAM:  Base Eye Exam    Visual Acuity (  Snellen - Linear)      Right Left   Dist cc 20/20 -1 20/30 -1   Dist ph cc  20/20 -1       Tonometry (Tonopen, 9:15 AM)      Right Left   Pressure 18 16       Pupils      Dark Light Shape React APD   Right 3 2 Round Brisk None   Left 3 2 Round Brisk None       Visual Fields (Counting fingers)      Left Right    Full Full       Extraocular Movement      Right Left    Full, Ortho Full, Ortho       Neuro/Psych    Oriented x3:  Yes   Mood/Affect:  Normal       Dilation    Both eyes:  1.0% Mydriacyl, 2.5% Phenylephrine @ 9:15 AM        Slit Lamp and Fundus Exam    Slit Lamp  Exam      Right Left   Lids/Lashes Dermatochalasis - upper lid, stye UL margin Meibomian gland dysfunction, mild Telangiectasia   Conjunctiva/Sclera Trace Injection White and quiet   Cornea 2+ Punctate epithelial erosions 1+ inferior Punctate epithelial erosions   Anterior Chamber deep, narrow temporal angle deep, narrow temporal angle   Iris Round and dilated Round and dilated   Lens 2+ Nuclear sclerosis, 2-3+ Cortical cataract 2-3+ Nuclear sclerosis, 2-3+ Cortical cataract   Vitreous Vitreous syneresis, Posterior vitreous detachment Vitreous syneresis, Posterior vitreous detachment       Fundus Exam      Right Left   Disc Pink and Sharp Pink and Sharp, midl temporal Peripapillary atrophy   C/D Ratio 0.3 0.2   Macula Blunted foveal reflex, No heme or edema, Drusen superior macula blunted foveal reflex, trace Epiretinal membrane, mild Retinal pigment epithelial mottling, Drusen superior macula   Vessels mild Vascular attenuation mild Vascular attenuation, mild Tortuousity   Periphery Attached, pigmented CR scar at 0330, inferior cobblestoning at 0600 with CR scars, operculated hole with surrounding laser scars at 1030, scattered reticular degeneration Attached, HST at 0230 with pigmented demarcation line - excellent laser surrounding, no SRF, pigmented peripheral cystoid degeneration          IMAGING AND PROCEDURES  Imaging and Procedures for @TODAY @  OCT, Retina - OU - Both Eyes       Right Eye Quality was good. Central Foveal Thickness: 309. Progression has been stable. Findings include normal foveal contour, no IRF, no SRF, epiretinal membrane, retinal drusen .   Left Eye Quality was good. Central Foveal Thickness: 321. Progression has been stable. Findings include normal foveal contour, no IRF, no SRF, epiretinal membrane, macular pucker, retinal drusen .   Notes *Images captured and stored on drive  Diagnosis / Impression:  NFP; no IRF/SRF OU ERM OU (OS > OD) Retinal  drusen OU  Clinical management:  See below  Abbreviations: NFP - Normal foveal profile. CME - cystoid macular edema. PED - pigment epithelial detachment. IRF - intraretinal fluid. SRF - subretinal fluid. EZ - ellipsoid zone. ERM - epiretinal membrane. ORA - outer retinal atrophy. ORT - outer retinal tubulation. SRHM - subretinal hyper-reflective material                 ASSESSMENT/PLAN:    ICD-10-CM   1. Retinal tear of left eye H33.312   2. History of repair of retinal  tear by laser photocoagulation Z98.890   3. Epiretinal membrane (ERM) of both eyes H35.373   4. Retinal edema H35.81 OCT, Retina - OU - Both Eyes  5. Early dry stage nonexudative age-related macular degeneration of both eyes H35.3131   6. Combined forms of age-related cataract of both eyes H25.813   7. Hordeolum externum of right upper eyelid H00.011    1. Retinal tear, OS     - tear located at 0230 with pigmented demarcation line, but no SRF  - s/p laser retinopexy (02.12.20) -- good laser in place  - f/u in 9-12 months  2. History of retinal tear OD  - operculated hole at 1030  - s/p laser retinopexy w/ Dr. Stephannie Li 2013  - stable  3,4. Epiretinal membrane, OU (OS>OD)  - OS with macular pucker -- BCVA 20/20 OU  - mild ERM OU  - asymptomatic, no metamorphopsia  - no indication for surgery at this time  - monitor for now  5. Age related macular degeneration, non-exudative, both eyes  - early stage  - The incidence, anatomy, and pathology of dry AMD, risk of progression, and the AREDS and AREDS 2 study including smoking risks discussed with patient.  - recommend amsler grid monitoring  6. Mixed form age related cataract OU  - The symptoms of cataract, surgical options, and treatments and risks were discussed with patient.  - discussed diagnosis and progression  - not yet visually significant  - monitor for now  7. Persistent sty, right upper eyelid margin  - completed 10 day course of  doxycycline per PCP  - continue hot compresses at least 20 min BID  - discussed findings and normal progression  - monitor   Ophthalmic Meds Ordered this visit:  No orders of the defined types were placed in this encounter.      Return for f/u 9-12 months retinal tear OS, DFE, OCT.  There are no Patient Instructions on file for this visit.   Explained the diagnoses, plan, and follow up with the patient and they expressed understanding.  Patient expressed understanding of the importance of proper follow up care.   This document serves as a record of services personally performed by Karie Chimera, MD, PhD. It was created on their behalf by Laurian Brim, OA, an ophthalmic assistant. The creation of this record is the provider's dictation and/or activities during the visit.    Electronically signed by: Laurian Brim, OA  05.25.2020 12:11 PM   Karie Chimera, M.D., Ph.D. Diseases & Surgery of the Retina and Vitreous [facility]  I have reviewed the above documentation for accuracy and completeness, and I agree with the above. Karie Chimera, M.D., Ph.D. 04/17/19 12:12 PM   Abbreviations: M myopia (nearsighted); A astigmatism; H hyperopia (farsighted); P presbyopia; Mrx spectacle prescription;  CTL contact lenses; OD right eye; OS left eye; OU both eyes  XT exotropia; ET esotropia; PEK punctate epithelial keratitis; PEE punctate epithelial erosions; DES dry eye syndrome; MGD meibomian gland dysfunction; ATs artificial tears; PFAT's preservative free artificial tears; NSC nuclear sclerotic cataract; PSC posterior subcapsular cataract; ERM epi-retinal membrane; PVD posterior vitreous detachment; RD retinal detachment; DM diabetes mellitus; DR diabetic retinopathy; NPDR non-proliferative diabetic retinopathy; PDR proliferative diabetic retinopathy; CSME clinically significant macular edema; DME diabetic macular edema; dbh dot blot hemorrhages; CWS cotton wool spot;  POAG primary open angle glaucoma; C/D cup-to-disc ratio; HVF humphrey visual field; GVF goldmann visual field; OCT optical coherence tomography; IOP  intraocular pressure; BRVO Branch retinal vein occlusion; CRVO central retinal vein occlusion; CRAO central retinal artery occlusion; BRAO branch retinal artery occlusion; RT retinal tear; SB scleral buckle; PPV pars plana vitrectomy; VH Vitreous hemorrhage; PRP panretinal laser photocoagulation; IVK intravitreal kenalog; VMT vitreomacular traction; MH Macular hole;  NVD neovascularization of the disc; NVE neovascularization elsewhere; AREDS age related eye disease study; ARMD age related macular degeneration; POAG primary open angle glaucoma; EBMD epithelial/anterior basement membrane dystrophy; ACIOL anterior chamber intraocular lens; IOL intraocular lens; PCIOL posterior chamber intraocular lens; Phaco/IOL phacoemulsification with intraocular lens placement; [place] photorefractive keratectomy; LASIK laser assisted in situ keratomileusis; HTN hypertension; DM diabetes mellitus; COPD chronic obstructive pulmonary disease

## 2019-04-17 ENCOUNTER — Encounter (INDEPENDENT_AMBULATORY_CARE_PROVIDER_SITE_OTHER): Payer: Self-pay | Admitting: Ophthalmology

## 2019-04-17 ENCOUNTER — Ambulatory Visit (INDEPENDENT_AMBULATORY_CARE_PROVIDER_SITE_OTHER): Payer: Medicare Other | Admitting: Ophthalmology

## 2019-04-17 ENCOUNTER — Other Ambulatory Visit (INDEPENDENT_AMBULATORY_CARE_PROVIDER_SITE_OTHER): Payer: Self-pay

## 2019-04-17 ENCOUNTER — Other Ambulatory Visit: Payer: Self-pay

## 2019-04-17 DIAGNOSIS — H25813 Combined forms of age-related cataract, bilateral: Secondary | ICD-10-CM

## 2019-04-17 DIAGNOSIS — Z9889 Other specified postprocedural states: Secondary | ICD-10-CM | POA: Diagnosis not present

## 2019-04-17 DIAGNOSIS — H353131 Nonexudative age-related macular degeneration, bilateral, early dry stage: Secondary | ICD-10-CM

## 2019-04-17 DIAGNOSIS — H00011 Hordeolum externum right upper eyelid: Secondary | ICD-10-CM

## 2019-04-17 DIAGNOSIS — H35373 Puckering of macula, bilateral: Secondary | ICD-10-CM | POA: Diagnosis not present

## 2019-04-17 DIAGNOSIS — H3581 Retinal edema: Secondary | ICD-10-CM | POA: Diagnosis not present

## 2019-04-17 DIAGNOSIS — H33312 Horseshoe tear of retina without detachment, left eye: Secondary | ICD-10-CM

## 2019-04-17 MED ORDER — NEOMYCIN-POLYMYXIN-DEXAMETH 0.1 % OP OINT: 1.0000 "application " | TOPICAL_OINTMENT | Freq: Four times a day (QID) | OPHTHALMIC | 2 refills | Status: DC

## 2019-04-17 NOTE — Addendum Note (Signed)
Addended by: Posey Boyer on: 04/17/2019 02:21 PM   Modules accepted: Orders

## 2019-06-06 ENCOUNTER — Encounter (HOSPITAL_COMMUNITY)
Admission: RE | Admit: 2019-06-06 | Discharge: 2019-06-06 | Disposition: A | Discharge status: Home / Self Care | Payer: Medicare Other | Source: Ambulatory Visit | Attending: Pulmonary Disease | Admitting: Pulmonary Disease

## 2019-06-06 ENCOUNTER — Other Ambulatory Visit: Payer: Self-pay

## 2019-06-06 DIAGNOSIS — E785 Hyperlipidemia, unspecified: Secondary | ICD-10-CM | POA: Insufficient documentation

## 2019-06-06 DIAGNOSIS — M810 Age-related osteoporosis without current pathological fracture: Secondary | ICD-10-CM | POA: Diagnosis not present

## 2019-06-06 DIAGNOSIS — E039 Hypothyroidism, unspecified: Secondary | ICD-10-CM | POA: Insufficient documentation

## 2019-06-06 LAB — LIPID PANEL
Cholesterol: 210 mg/dL — ABNORMAL HIGH (ref 0–200)
HDL: 55 mg/dL (ref >40)
LDL Cholesterol: 125 mg/dL — ABNORMAL HIGH (ref 0–99)
Total CHOL/HDL Ratio: 3.8 RATIO
Triglycerides: 151 mg/dL — ABNORMAL HIGH (ref <150)
VLDL: 30 mg/dL (ref 0–40)

## 2019-06-06 LAB — COMPREHENSIVE METABOLIC PANEL
ALT: 17 U/L (ref 0–44)
AST: 23 U/L (ref 15–41)
Albumin: 4.6 g/dL (ref 3.5–5.0)
Alkaline Phosphatase: 52 U/L (ref 38–126)
Anion gap: 11 (ref 5–15)
BUN: 13 mg/dL (ref 8–23)
CO2: 21 mmol/L — ABNORMAL LOW (ref 22–32)
Calcium: 9.2 mg/dL (ref 8.9–10.3)
Chloride: 108 mmol/L (ref 98–111)
Creatinine, Ser: 0.7 mg/dL (ref 0.44–1.00)
GFR calc Af Amer: >60 mL/min (ref >60)
GFR calc non Af Amer: >60 mL/min (ref >60)
Glucose, Bld: 93 mg/dL (ref 70–99)
Potassium: 4.4 mmol/L (ref 3.5–5.1)
Sodium: 140 mmol/L (ref 135–145)
Total Bilirubin: 1.7 mg/dL — ABNORMAL HIGH (ref 0.3–1.2)
Total Protein: 7.3 g/dL (ref 6.5–8.1)

## 2019-06-06 LAB — TSH
TSH: 2.606 u[IU]/mL (ref 0.350–4.500)
TSH: 2.61 (ref <=5.90)

## 2019-06-06 LAB — CBC WITH DIFFERENTIAL/PLATELET
Abs Immature Granulocytes: 0.01 10*3/uL (ref 0.00–0.07)
Basophils Absolute: 0 10*3/uL (ref 0.0–0.1)
Basophils Relative: 1 %
Eosinophils Absolute: 0.2 10*3/uL (ref 0.0–0.5)
Eosinophils Relative: 2 %
HCT: 46.1 % — ABNORMAL HIGH (ref 36.0–46.0)
Hemoglobin: 14.9 g/dL (ref 12.0–15.0)
Immature Granulocytes: 0 %
Lymphocytes Relative: 29 %
Lymphs Abs: 2.1 10*3/uL (ref 0.7–4.0)
MCH: 31 pg (ref 26.0–34.0)
MCHC: 32.3 g/dL (ref 30.0–36.0)
MCV: 95.8 fL (ref 80.0–100.0)
Monocytes Absolute: 0.5 10*3/uL (ref 0.1–1.0)
Monocytes Relative: 7 %
Neutro Abs: 4.3 10*3/uL (ref 1.7–7.7)
Neutrophils Relative %: 61 %
Platelets: 252 10*3/uL (ref 150–400)
RBC: 4.81 MIL/uL (ref 3.87–5.11)
RDW: 12.7 % (ref 11.5–15.5)
WBC: 7 10*3/uL (ref 4.0–10.5)
nRBC: 0 % (ref 0.0–0.2)

## 2019-06-06 MED ORDER — ZOLEDRONIC ACID 5 MG/100ML IV SOLN
5.0000 mg | Freq: Once | INTRAVENOUS | Status: AC
  Administered 2019-06-06: 5 mg via INTRAVENOUS

## 2019-06-06 MED ORDER — ZOLEDRONIC ACID 5 MG/100ML IV SOLN
INTRAVENOUS | Status: AC
  Filled 2019-06-06: qty 100

## 2019-06-06 MED ORDER — SODIUM CHLORIDE 0.9 % IV SOLN
Freq: Once | INTRAVENOUS | Status: AC
  Administered 2019-06-06: 10:00:00 via INTRAVENOUS

## 2019-06-06 NOTE — Progress Notes (Signed)
Results for ZANARIA, MORELL (MRN [id number]) as of 06/06/2019 16:19  Ref. Range 06/06/2019 09:20  COMPREHENSIVE METABOLIC PANEL Unknown Rpt (A)  Sodium Latest Ref Range: 135 - 145 mmol/L 140  Potassium Latest Ref Range: 3.5 - 5.1 mmol/L 4.4  Chloride Latest Ref Range: 98 - 111 mmol/L 108  CO2 Latest Ref Range: 22 - 32 mmol/L 21 (L)  Glucose Latest Ref Range: 70 - 99 mg/dL 93  BUN Latest Ref Range: 8 - 23 mg/dL 13  Creatinine Latest Ref Range: 0.44 - 1.00 mg/dL 0.70  Calcium Latest Ref Range: 8.9 - 10.3 mg/dL 9.2  Anion gap Latest Ref Range: 5 - 15  11  Alkaline Phosphatase Latest Ref Range: 38 - 126 U/L 52  Albumin Latest Ref Range: 3.5 - 5.0 g/dL 4.6  AST Latest Ref Range: 15 - 41 U/L 23  ALT Latest Ref Range: 0 - 44 U/L 17  Total Protein Latest Ref Range: 6.5 - 8.1 g/dL 7.3  Total Bilirubin Latest Ref Range: 0.3 - 1.2 mg/dL 1.7 (H)  GFR, Est Non African American Latest Ref Range: >60 mL/min >60  GFR, Est African American Latest Ref Range: >60 mL/min >60  Total CHOL/HDL Ratio Latest Units: RATIO 3.8  Cholesterol Latest Ref Range: 0 - 200 mg/dL 210 (H)  HDL Cholesterol Latest Ref Range: >40 mg/dL 55  LDL (calc) Latest Ref Range: 0 - 99 mg/dL 125 (H)  Triglycerides Latest Ref Range: <150 mg/dL 151 (H)  VLDL Latest Ref Range: 0 - 40 mg/dL 30  WBC Latest Ref Range: 4.0 - 10.5 K/uL 7.0  RBC Latest Ref Range: 3.87 - 5.11 MIL/uL 4.81  Hemoglobin Latest Ref Range: 12.0 - 15.0 g/dL 14.9  HCT Latest Ref Range: 36.0 - 46.0 % 46.1 (H)  MCV Latest Ref Range: 80.0 - 100.0 fL 95.8  MCH Latest Ref Range: 26.0 - 34.0 pg 31.0  MCHC Latest Ref Range: 30.0 - 36.0 g/dL 32.3  RDW Latest Ref Range: 11.5 - 15.5 % 12.7  Platelets Latest Ref Range: 150 - 400 K/uL 252  nRBC Latest Ref Range: 0.0 - 0.2 % 0.0  Neutrophils Latest Units: % 61  Lymphocytes Latest Units: % 29  Monocytes Relative Latest Units: % 7  Eosinophil Latest Units: % 2  Basophil Latest Units: % 1  Immature Granulocytes Latest Units: % 0   NEUT# Latest Ref Range: 1.7 - 7.7 K/uL 4.3  Lymphocyte # Latest Ref Range: 0.7 - 4.0 K/uL 2.1  Monocyte # Latest Ref Range: 0.1 - 1.0 K/uL 0.5  Eosinophils Absolute Latest Ref Range: 0.0 - 0.5 K/uL 0.2  Basophils Absolute Latest Ref Range: 0.0 - 0.1 K/uL 0.0  Abs Immature Granulocytes Latest Ref Range: 0.00 - 0.07 K/uL 0.01  TSH Latest Ref Range: 0.350 - 4.500 uIU/mL 2.606

## 2019-08-16 ENCOUNTER — Other Ambulatory Visit (HOSPITAL_COMMUNITY): Payer: Self-pay | Admitting: Pulmonary Disease

## 2019-08-16 ENCOUNTER — Other Ambulatory Visit: Payer: Self-pay

## 2019-08-16 ENCOUNTER — Ambulatory Visit (INDEPENDENT_AMBULATORY_CARE_PROVIDER_SITE_OTHER): Payer: Medicare Other | Admitting: Ophthalmology

## 2019-08-16 ENCOUNTER — Encounter (INDEPENDENT_AMBULATORY_CARE_PROVIDER_SITE_OTHER): Payer: Self-pay | Admitting: Ophthalmology

## 2019-08-16 DIAGNOSIS — Z9889 Other specified postprocedural states: Secondary | ICD-10-CM | POA: Diagnosis not present

## 2019-08-16 DIAGNOSIS — H532 Diplopia: Secondary | ICD-10-CM

## 2019-08-16 DIAGNOSIS — H35373 Puckering of macula, bilateral: Secondary | ICD-10-CM

## 2019-08-16 DIAGNOSIS — H33312 Horseshoe tear of retina without detachment, left eye: Secondary | ICD-10-CM

## 2019-08-16 DIAGNOSIS — H3581 Retinal edema: Secondary | ICD-10-CM | POA: Diagnosis not present

## 2019-08-16 DIAGNOSIS — Z1231 Encounter for screening mammogram for malignant neoplasm of breast: Secondary | ICD-10-CM

## 2019-08-16 DIAGNOSIS — H353131 Nonexudative age-related macular degeneration, bilateral, early dry stage: Secondary | ICD-10-CM

## 2019-08-16 DIAGNOSIS — H25813 Combined forms of age-related cataract, bilateral: Secondary | ICD-10-CM

## 2019-08-16 NOTE — Progress Notes (Signed)
[facility] - Clinic Note  08/16/2019     CHIEF COMPLAINT Patient presents for Blurred Vision   HISTORY OF PRESENT ILLNESS: Gina Gibbs is a 73 y.o. female who presents to the clinic today for:   HPI    Blurred Vision    In left eye.  Onset was sudden.  Vision is blurred, difficult to focus, hazy and distorted.  Severity is moderate.  This started 1 day ago.  Occurring constantly.  It is worse in the morning and throughout the day.  Context:  distance vision, mid-range vision and near vision.  Since onset it is stable.  Associated symptoms include double vision.  Treatments tried include artificial tears.  Response to treatment was no improvement.  I, the attending physician,  performed the HPI with the patient and updated documentation appropriately.          Comments    73 y/o female pt here for eval of sudden onset blurred VA and pain OS x 1 day.  Symptoms began early yesterday morning and have been constant since.  No obvious trigger.  Denies flashes, floaters.  No issues OD.  Systane prn OU.       Last edited by Rennis ChrisZamora, Zianne Schubring, MD on 08/16/2019  9:53 AM. (History)    pt states yesterday she noticed her vision was blurry, she states she feels like she is seeing double vision out of her left eye and she is having pain in the back of her eye, she states sometimes the diplopia looks like shadows, she states there is no improvement with blinking or positioning, she states she has not gotten new glasses in awhile, she feels like the pain is a little better today, but not the double vision, pt states she tried to get an appt with Dr. Joycelyn Manotter's office, but was unable to get an appt  Referring physician: Kari BaarsHawkins, Edward, MD [address] [place] [id number]  HISTORICAL INFORMATION:   Selected notes from the MEDICAL RECORD NUMBER Referred by Dr. Daisy LazarMark Cotter for retinal hole OD LEE: 02.06.20 Fabienne Bruns(M. Cotter) [BCVA: 20/20 OU] Ocular Hx-cellophane OD, ERM OD, PVD  OD, retinal hole OD PMH-arthritis    CURRENT MEDICATIONS: Current Outpatient Medications (Ophthalmic Drugs)  Medication Sig  . neomycin-polymyxin-dexameth (MAXITROL) 0.1 % OINT Place 1 application into the right eye 4 (four) times daily. (Patient not taking: Reported on 08/16/2019)   No current facility-administered medications for this visit.  (Ophthalmic Drugs)   Current Outpatient Medications (Other)  Medication Sig  . estradiol (ESTRACE VAGINAL) 0.1 MG/GM vaginal cream Place 1 Applicatorful vaginally at bedtime.  Marland Kitchen. levothyroxine (SYNTHROID, LEVOTHROID) 25 MCG tablet Take 25 mcg by mouth daily.  . meloxicam (MOBIC) 15 MG tablet Take 15 mg by mouth daily.   . pantoprazole (PROTONIX) 40 MG tablet Take 40 mg by mouth daily.  Marland Kitchen. therapeutic multivitamin-minerals (THERAGRAN-M) tablet Take 1 tablet by mouth daily.   No current facility-administered medications for this visit.  (Other)      REVIEW OF SYSTEMS: ROS    Positive for: Gastrointestinal, Musculoskeletal, Eyes   Negative for: Constitutional, Neurological, Skin, Genitourinary, HENT, Endocrine, Cardiovascular, Respiratory, Psychiatric, Allergic/Imm, Heme/Lymph   Last edited by Celine MansBaxley, Andrew G, COA on 08/16/2019  9:19 AM. (History)       ALLERGIES Allergies  Allergen Reactions  . Tape     adhesives    PAST MEDICAL HISTORY Past Medical History:  Diagnosis Date  . Arthritis   . Complication of anesthesia   .  GERD (gastroesophageal reflux disease)   . PONV (postoperative nausea and vomiting)    Past Surgical History:  Procedure Laterality Date  . ABDOMINAL HYSTERECTOMY    . EAR CYST EXCISION  02/23/2012   Procedure: CYST REMOVAL;  Surgeon: Nicki Reaper, MD;  Location:  SURGERY CENTER;  Service: Orthopedics;  Laterality: Left;  debridement distal interphalangeal left index finger, excision cyst/loose body  . FRACTURE SURGERY  09/2013   right ankle  . spinal cyst removal      FAMILY HISTORY Family History   Problem Relation Age of Onset  . Diabetes Mother     SOCIAL HISTORY Social History   Tobacco Use  . Smoking status: Never Smoker  . Smokeless tobacco: Never Used  Substance Use Topics  . Alcohol use: Yes    Comment: rare  . Drug use: No         OPHTHALMIC EXAM:  Base Eye Exam    Visual Acuity (Snellen - Linear)      Right Left   Dist cc 20/25 20/40 -2   Dist ph cc 20/20 -2 20/25 -2   Correction: Glasses  Reports double vision OS while checking VA without pinhole.  Double vision clears with pinhole use.       Tonometry (Tonopen, 9:22 AM)      Right Left   Pressure 13 14       Pupils      Dark Light Shape React APD   Right 3 2 Round Brisk None   Left 3 2 Round Brisk None       Visual Fields (Counting fingers)      Left Right    Full Full       Extraocular Movement      Right Left    Full, Ortho Full, Ortho       Neuro/Psych    Oriented x3: Yes   Mood/Affect: Normal       Dilation    Both eyes: 1.0% Mydriacyl, 2.5% Phenylephrine @ 9:22 AM        Slit Lamp and Fundus Exam    Slit Lamp Exam      Right Left   Lids/Lashes Dermatochalasis - upper lid, mild Meibomian gland dysfunction Dermatochalasis - upper lid, mild Meibomian gland dysfunction   Conjunctiva/Sclera White and quiet White and quiet   Cornea Trace Punctate epithelial erosions 1+ inferior Punctate epithelial erosions   Anterior Chamber deep, narrow temporal angle, trace pigment deep, narrow temporal angle, trace pigment   Iris Round and dilated Round and dilated   Lens 2+ Nuclear sclerosis, 2-3+ Cortical cataract 2-3+ Nuclear sclerosis, 3+ Cortical cataract   Vitreous Vitreous syneresis, Posterior vitreous detachment Vitreous syneresis, Posterior vitreous detachment       Fundus Exam      Right Left   Disc Pink and Sharp Pink and Sharp, mild temporal Peripapillary atrophy   C/D Ratio 0.3 0.2   Macula Blunted foveal reflex, Drusen greatest superior macula, No heme or edema blunted  foveal reflex, trace Epiretinal membrane superiorly, mild Retinal pigment epithelial mottling, Drusen greatest superior macula   Vessels mild Vascular attenuation, Tortuousity mild Vascular attenuation, mild Tortuousity   Periphery Attached, blonde peripheral fundus, pigmented CR scar at 0330, inferior cobblestoning at 0600 with CR scars, operculated hole with surrounding laser scars at 1030, scattered reticular degeneration Attached, blonde peripheral fundus, HST at 0230 with pigmented demarcation line - excellent laser surrounding, no SRF, pigmented peripheral cystoid degeneration  IMAGING AND PROCEDURES  Imaging and Procedures for @TODAY @  OCT, Retina - OU - Both Eyes       Right Eye Quality was good. Central Foveal Thickness: 307. Progression has been stable. Findings include normal foveal contour, no IRF, no SRF, epiretinal membrane, retinal drusen  (Focal ERM SN macula - stable from prior).   Left Eye Quality was good. Central Foveal Thickness: 318. Progression has been stable. Findings include normal foveal contour, no IRF, no SRF, epiretinal membrane, macular pucker, retinal drusen  (ERM superior macula - stable from prior).   Notes *Images captured and stored on drive  Diagnosis / Impression:  NFP; no IRF/SRF OU ERM OU (OS > OD) Retinal drusen OU  Clinical management:  See below  Abbreviations: NFP - Normal foveal profile. CME - cystoid macular edema. PED - pigment epithelial detachment. IRF - intraretinal fluid. SRF - subretinal fluid. EZ - ellipsoid zone. ERM - epiretinal membrane. ORA - outer retinal atrophy. ORT - outer retinal tubulation. SRHM - subretinal hyper-reflective material                 ASSESSMENT/PLAN:    ICD-10-CM   1. Retinal tear of left eye  H33.312   2. History of repair of retinal tear by laser photocoagulation  Z98.890   3. Epiretinal membrane (ERM) of both eyes  H35.373   4. Retinal edema  H35.81 OCT, Retina - OU - Both Eyes   5. Early dry stage nonexudative age-related macular degeneration of both eyes  H35.3131   6. Combined forms of age-related cataract of both eyes  H25.813   7. Diplopia  H53.2      1. Retinal tear, OS     - tear located at 0230 with pigmented demarcation line, but no SRF  - s/p laser retinopexy (02.12.20) -- excellent laser in place  - stable  - f/u as scheduled in Feb 2021  2. History of retinal tear OD  - operculated hole at 1030  - s/p laser retinopexy w/ Dr. Mar 2021 2013  - stable  3,4. Epiretinal membrane, OU (OS>OD)  - OS with mild macular pucker superonasal macula -- BCVA 20/20 OD; 20/25 OS  - mild ERM OU  - asymptomatic, no metamorphopsia  - no indication for surgery at this time  - monitor for now  5. Age related macular degeneration, non-exudative, both eyes  - early stage  - The incidence, anatomy, and pathology of dry AMD, risk of progression, and the AREDS and AREDS 2 study including smoking risks discussed with patient.  - recommend amsler grid monitoring  6. Mixed form age related cataract OU  - The symptoms of cataract, surgical options, and treatments and risks were discussed with patient.  - discussed diagnosis and progression  - ?progression and early visual significance OS causing new symptoms OS (see below)  - will refer to [facility] for evaluation and management  7. Intermittent monocular diplopia and blurred vision OS  - pt reports intermittent monocular diplopia OS -- images sometimes skewed, sometimes vertical  - during visual acuity testing OS, improved with pinhole  - during EOM testing -- nonpresent in primary gaze and elicited in extreme gazes  - suspect may be related to mild progression of cataract OS -- no other changes on exam noted today  - retina stable  - recommend referral to [facility] for evaluation and management of this and cataracts  - pt's husband is a pt at [facility]  Ophthalmic Meds Ordered this visit:   No orders of the defined types were placed in this encounter.      Return for as scheduled.  There are no Patient Instructions on file for this visit.   Explained the diagnoses, plan, and follow up with the patient and they expressed understanding.  Patient expressed understanding of the importance of proper follow up care.   This document serves as a record of services personally performed by Gardiner Sleeper, MD, PhD. It was created on their behalf by Ernest Mallick, OA, an ophthalmic assistant. The creation of this record is the provider's dictation and/or activities during the visit.    Electronically signed by: Ernest Mallick, OA  09.24.2020 11:49 AM     Gardiner Sleeper, M.D., Ph.D. Diseases & Surgery of the Retina and Vitreous [facility]  I have reviewed the above documentation for accuracy and completeness, and I agree with the above. Gardiner Sleeper, M.D., Ph.D. 08/16/19 11:49 AM   Abbreviations: M myopia (nearsighted); A astigmatism; H hyperopia (farsighted); P presbyopia; Mrx spectacle prescription;  CTL contact lenses; OD right eye; OS left eye; OU both eyes  XT exotropia; ET esotropia; PEK punctate epithelial keratitis; PEE punctate epithelial erosions; DES dry eye syndrome; MGD meibomian gland dysfunction; ATs artificial tears; PFAT's preservative free artificial tears; [place] nuclear sclerotic cataract; PSC posterior subcapsular cataract; ERM epi-retinal membrane; PVD posterior vitreous detachment; RD retinal detachment; DM diabetes mellitus; DR diabetic retinopathy; NPDR non-proliferative diabetic retinopathy; PDR proliferative diabetic retinopathy; CSME clinically significant macular edema; DME diabetic macular edema; dbh dot blot hemorrhages; CWS cotton wool spot; POAG primary open angle glaucoma; C/D cup-to-disc ratio; HVF humphrey visual field; GVF goldmann visual field; OCT optical coherence tomography; IOP intraocular pressure; BRVO Branch retinal vein  occlusion; CRVO central retinal vein occlusion; CRAO central retinal artery occlusion; BRAO branch retinal artery occlusion; RT retinal tear; SB scleral buckle; PPV pars plana vitrectomy; VH Vitreous hemorrhage; PRP panretinal laser photocoagulation; IVK intravitreal kenalog; VMT vitreomacular traction; MH Macular hole;  NVD neovascularization of the disc; NVE neovascularization elsewhere; AREDS age related eye disease study; ARMD age related macular degeneration; POAG primary open angle glaucoma; EBMD epithelial/anterior basement membrane dystrophy; ACIOL anterior chamber intraocular lens; IOL intraocular lens; PCIOL posterior chamber intraocular lens; Phaco/IOL phacoemulsification with intraocular lens placement; [place] photorefractive keratectomy; LASIK laser assisted in situ keratomileusis; HTN hypertension; DM diabetes mellitus; COPD chronic obstructive pulmonary disease

## 2019-09-17 ENCOUNTER — Ambulatory Visit (HOSPITAL_COMMUNITY)
Admission: RE | Admit: 2019-09-17 | Discharge: 2019-09-17 | Disposition: A | Discharge status: Home / Self Care | Payer: Medicare Other | Source: Ambulatory Visit | Attending: Pulmonary Disease | Admitting: Pulmonary Disease

## 2019-09-17 ENCOUNTER — Other Ambulatory Visit: Payer: Self-pay

## 2019-09-17 DIAGNOSIS — Z1231 Encounter for screening mammogram for malignant neoplasm of breast: Secondary | ICD-10-CM | POA: Diagnosis present

## 2019-10-01 ENCOUNTER — Ambulatory Visit: Payer: Medicare Other | Admitting: Family Medicine

## 2019-10-01 ENCOUNTER — Encounter: Payer: Self-pay | Admitting: Family Medicine

## 2019-10-01 ENCOUNTER — Telehealth: Payer: Self-pay

## 2019-10-01 ENCOUNTER — Other Ambulatory Visit: Payer: Self-pay

## 2019-10-01 VITALS — BP 130/80 | HR 74 | Temp 98.2°F | Ht 62.0 in | Wt 145.6 lb

## 2019-10-01 DIAGNOSIS — Z23 Encounter for immunization: Secondary | ICD-10-CM | POA: Diagnosis not present

## 2019-10-01 DIAGNOSIS — Z1211 Encounter for screening for malignant neoplasm of colon: Secondary | ICD-10-CM

## 2019-10-01 DIAGNOSIS — M810 Age-related osteoporosis without current pathological fracture: Secondary | ICD-10-CM | POA: Diagnosis not present

## 2019-10-01 NOTE — Patient Instructions (Signed)
Bone Density Cologuard

## 2019-10-01 NOTE — Progress Notes (Signed)
New Patient Office Visit  Subjective:  Patient ID: Gina Gibbs, female    DOB: 1946/05/03  Age: 73 y.o. MRN: [id number]  CC:  Chief Complaint  Patient presents with  . Establish Care    HPI Gina Gibbs presents for  GERD-protonix Hyperlipidemia-no meds Hypothyroid-synthroid daily Osteoporosis-fosamax in the past-Reclast yearly-Vit D 1000IU, eating Calcium-no recent Vit D level Arthritis-mobic-lower back Eye exam-Dr. Katy Fitch  Past Medical History:  Diagnosis Date  . Arthritis   . Complication of anesthesia   . GERD (gastroesophageal reflux disease) 02/28/2013  . Hyperlipidemia 02/28/2013  . Hypothyroid   . Hypothyroid 09/26/2013  . Osteoporosis   . PONV (postoperative nausea and vomiting)     Past Surgical History:  Procedure Laterality Date  . ABDOMINAL HYSTERECTOMY    . EAR CYST EXCISION  02/23/2012   Procedure: CYST REMOVAL;  Surgeon: Wynonia Sours, MD;  Location: [place];  Service: Orthopedics;  Laterality: Left;  debridement distal interphalangeal left index finger, excision cyst/loose body  . FRACTURE SURGERY  09/2013   right ankle  . spinal cyst removal      Family History  Problem Relation Age of Onset  . Diabetes Mother     Social History   Socioeconomic History  . Marital status: Married    Spouse name: Not on file  . Number of children: Not on file  . Years of education: Not on file  . Highest education level: Not on file  Occupational History  . Occupation: retired  Scientific laboratory technician  . Financial resource strain: Not on file  . Food insecurity    Worry: Not on file    Inability: Not on file  . Transportation needs    Medical: Not on file    Non-medical: Not on file  Tobacco Use  . Smoking status: Never Smoker  . Smokeless tobacco: Never Used  Substance and Sexual Activity  . Alcohol use: Yes    Comment: rare  . Drug use: No  . Sexual activity: Never    Partners: Male    Birth control/protection: Surgical   Comment: hyst  Lifestyle  . Physical activity    Days per week: Not on file    Minutes per session: Not on file  . Stress: Not on file  Relationships  . Social Herbalist on phone: Not on file    Gets together: Not on file    Attends religious service: Not on file    Active member of club or organization: Not on file    Attends meetings of clubs or organizations: Not on file    Relationship status: Not on file  . Intimate partner violence    Fear of current or ex partner: Not on file    Emotionally abused: Not on file    Physically abused: Not on file    Forced sexual activity: Not on file  Other Topics Concern  . Not on file  Social History Narrative  . Not on file    ROS Review of Systems  Constitutional: Negative.   HENT: Negative.   Eyes:       Glasses-sees retinal specialist cataract  Respiratory: Negative.   Cardiovascular: Negative.   Gastrointestinal: Negative.        Reflux  Endocrine: Negative.   Genitourinary:       Pessary incontinence  Musculoskeletal: Negative.   Skin: Negative.   Allergic/Immunologic: Negative.   Neurological: Negative.   Hematological: Negative.  Objective:   Today's Vitals: BP 130/80 (BP Location: Left Arm, Patient Position: Sitting, Cuff Size: Normal)   Pulse 74   Temp 98.2 F (36.8 C) (Oral)   Ht 5\' 2"  (1.575 m)   Wt 145 lb 9.6 oz (66 kg)   SpO2 96%   BMI 26.63 kg/m   Physical Exam Constitutional:      Appearance: Normal appearance. She is normal weight.  HENT:     Head: Normocephalic and atraumatic.     Nose: Nose normal.  Eyes:     Conjunctiva/sclera: Conjunctivae normal.  Neck:     Musculoskeletal: Normal range of motion and neck supple.  Cardiovascular:     Rate and Rhythm: Normal rate and regular rhythm.     Pulses: Normal pulses.     Heart sounds: Normal heart sounds.  Pulmonary:     Effort: Pulmonary effort is normal.     Breath sounds: Normal breath sounds.  Abdominal:     Palpations:  Abdomen is soft.  Skin:    General: Skin is warm.  Neurological:     Mental Status: She is alert and oriented to person, place, and time.  Psychiatric:        Mood and Affect: Mood normal.        Behavior: Behavior normal.     Assessment & Plan:    Outpatient Encounter Medications as of 10/01/2019  Medication Sig  . levothyroxine (SYNTHROID, LEVOTHROID) 25 MCG tablet Take 25 mcg by mouth daily.  . meloxicam (MOBIC) 15 MG tablet Take 15 mg by mouth daily.   . pantoprazole (PROTONIX) 40 MG tablet Take 40 mg by mouth daily.  13/07/2019 therapeutic multivitamin-minerals (THERAGRAN-M) tablet Take 1 tablet by mouth daily.  . [DISCONTINUED] estradiol (ESTRACE VAGINAL) 0.1 MG/GM vaginal cream Place 1 Applicatorful vaginally at bedtime. (Patient not taking: Reported on 10/01/2019)  . [DISCONTINUED] neomycin-polymyxin-dexameth (MAXITROL) 0.1 % OINT Place 1 application into the right eye 4 (four) times daily. (Patient not taking: Reported on 08/16/2019)   No facility-administered encounter medications on file as of 10/01/2019.     Follow-up: yearly LISA 09-26-1979, MD

## 2019-10-01 NOTE — Telephone Encounter (Signed)
Gina Gibbs, CMA  

## 2019-10-10 LAB — COLOGUARD
Cologuard: NEGATIVE
Cologuard: NEGATIVE

## 2019-10-18 LAB — COLOGUARD: Cologuard: NEGATIVE

## 2020-01-16 NOTE — Progress Notes (Signed)
[facility] Note  01/18/2020     CHIEF COMPLAINT Patient presents for Retina Follow Up   HISTORY OF PRESENT ILLNESS: Gina Gibbs is a 74 y.o. female who presents to the clinic today for:   HPI    Retina Follow Up    Patient presents with  Retinal Break/Detachment.  In left eye.  This started 1 year ago.  Severity is mild.  Duration of 5 months.  Since onset it is gradually improving.  I, the attending physician,  performed the HPI with the patient and updated documentation appropriately.          Comments    74 y/o female pt here for 5 mo f/u.  S/p laser retinopexy 2.12.20 for ret tear @ 2:30 OS.  VA improved OS.  VA stable OD.  Still has mild monocular diplopia OS.  Denies pain, flashes, floaters.  Systane prn OU.       Last edited by Bernarda Caffey, MD on 01/18/2020  5:15 PM. (History)    pt states she is seeing Dr. Midge Aver for cataracts, dry eyes and diplopia, but she feels like the symptoms are improving, she is using Systane qid OU and doing lid scrubs   Referring physician: Sinda Du, MD No address on file  HISTORICAL INFORMATION:   Selected notes from the [place] Referred by Dr. Madelin Headings for retinal hole OD LEE: 02.06.20 Jerilynn Mages. [place]) [BCVA: 20/20 OU] Ocular Hx-cellophane OD, ERM OD, PVD OD, retinal hole OD PMH-arthritis    CURRENT MEDICATIONS: No current outpatient medications on file. (Ophthalmic Drugs)   No current facility-administered medications for this visit. (Ophthalmic Drugs)   Current Outpatient Medications (Other)  Medication Sig  . levothyroxine (SYNTHROID, LEVOTHROID) 25 MCG tablet Take 25 mcg by mouth daily.  . meloxicam (MOBIC) 15 MG tablet Take 15 mg by mouth daily.   . pantoprazole (PROTONIX) 40 MG tablet Take 40 mg by mouth daily.  Marland Kitchen therapeutic multivitamin-minerals (THERAGRAN-M) tablet Take 1 tablet by mouth daily.   No current facility-administered medications for this visit.  (Other)      REVIEW OF SYSTEMS: ROS    Positive for: Gastrointestinal, Musculoskeletal, Eyes   Negative for: Constitutional, Neurological, Skin, Genitourinary, HENT, Endocrine, Cardiovascular, Respiratory, Psychiatric, Allergic/Imm, Heme/Lymph   Last edited by Matthew Folks, COA on 01/18/2020 10:01 AM. (History)       ALLERGIES Allergies  Allergen Reactions  . Tape     adhesives    PAST MEDICAL HISTORY Past Medical History:  Diagnosis Date  . Arthritis   . Cataract    Mixed forms OU  . Complication of anesthesia   . GERD (gastroesophageal reflux disease) 02/28/2013  . Hyperlipidemia 02/28/2013  . Hypothyroid   . Hypothyroid 09/26/2013  . Macular degeneration    non-exu OU  . Osteoporosis   . PONV (postoperative nausea and vomiting)    Past Surgical History:  Procedure Laterality Date  . ABDOMINAL HYSTERECTOMY    . EAR CYST EXCISION  02/23/2012   Procedure: CYST REMOVAL;  Surgeon: Wynonia Sours, MD;  Location: [place];  Service: Orthopedics;  Laterality: Left;  debridement distal interphalangeal left index finger, excision cyst/loose body  . FRACTURE SURGERY  09/2013   right ankle  . spinal cyst removal      FAMILY HISTORY Family History  Problem Relation Age of Onset  . Diabetes Mother     SOCIAL HISTORY Social History   Tobacco Use  .  Smoking status: Never Smoker  . Smokeless tobacco: Never Used  Substance Use Topics  . Alcohol use: Yes    Comment: rare  . Drug use: No         OPHTHALMIC EXAM:  Base Eye Exam    Visual Acuity (Snellen - Linear)      Right Left   Dist cc 20/20 -2 20/20 -2   Correction: Glasses       Tonometry (Tonopen, 10:04 AM)      Right Left   Pressure 14 14       Pupils      Dark Light Shape React APD   Right 3 2 Round Brisk None   Left 3 2 Round Brisk None       Visual Fields (Counting fingers)      Left Right    Full Full       Extraocular Movement      Right Left    Full, Ortho Full,  Ortho       Neuro/Psych    Oriented x3: Yes   Mood/Affect: Normal       Dilation    Both eyes: 1.0% Mydriacyl, 2.5% Phenylephrine @ 10:04 AM        Slit Lamp and Fundus Exam    Slit Lamp Exam      Right Left   Lids/Lashes Dermatochalasis - upper lid, mild Meibomian gland dysfunction Dermatochalasis - upper lid, mild Meibomian gland dysfunction   Conjunctiva/Sclera White and quiet White and quiet   Cornea 1-2+Punctate epithelial erosions 1+ inferior Punctate epithelial erosions   Anterior Chamber deep, narrow temporal angle deep, narrow temporal angle   Iris Round and dilated Round and dilated   Lens 2+ Nuclear sclerosis, 3+ Cortical cataract 2-3+ Nuclear sclerosis, 3+ Cortical cataract   Vitreous Vitreous syneresis, Posterior vitreous detachment Vitreous syneresis, Posterior vitreous detachment       Fundus Exam      Right Left   Disc Pink and Sharp Pink and Sharp, mild temporal Peripapillary atrophy, Compact, Tilted disc   C/D Ratio 0.3 0.2   Macula Flat, Blunted foveal reflex, Drusen greatest superior macula, No heme or edema, stable from prior Flat, blunted foveal reflex, trace Epiretinal membrane superiorly, mild Retinal pigment epithelial mottling, Drusen greatest superior macula   Vessels mild Vascular attenuation, Tortuousity mild Vascular attenuation, mild Tortuousity   Periphery Attached, blonde peripheral fundus, pigmented CR scar at 0330, inferior cobblestoning at 0600 with CR scars, operculated hole with surrounding laser scars at 1030 equator, scattered reticular degeneration, no new RT/RD Attached, blonde peripheral fundus, HST at 0230 with pigmented demarcation line - excellent laser surrounding, no SRF, pigmented peripheral cystoid degeneration, no new RT/RD          IMAGING AND PROCEDURES  Imaging and Procedures for _0 @  OCT, Retina - OU - Both Eyes       Right Eye Quality was good. Central Foveal Thickness: 306. Progression has been stable. Findings  include normal foveal contour, no IRF, no SRF, epiretinal membrane, retinal drusen  (Focal ERM SN macula - stable from prior).   Left Eye Quality was good. Central Foveal Thickness: 311. Progression has been stable. Findings include normal foveal contour, no IRF, no SRF, epiretinal membrane, macular pucker, retinal drusen  (ERM superior macula - stable from prior).   Notes *Images captured and stored on drive  Diagnosis / Impression:  NFP; no IRF/SRF OU ERM OU (OS > OD) Retinal drusen OU  Clinical management:  See below  Abbreviations: NFP - Normal foveal profile. CME - cystoid macular edema. PED - pigment epithelial detachment. IRF - intraretinal fluid. SRF - subretinal fluid. EZ - ellipsoid zone. ERM - epiretinal membrane. ORA - outer retinal atrophy. ORT - outer retinal tubulation. SRHM - subretinal hyper-reflective material                 ASSESSMENT/PLAN:    ICD-10-CM   1. Retinal tear of left eye  H33.312   2. History of repair of retinal tear by laser photocoagulation  Z98.890   3. Epiretinal membrane (ERM) of both eyes  H35.373   4. Retinal edema  H35.81 OCT, Retina - OU - Both Eyes  5. Early dry stage nonexudative age-related macular degeneration of both eyes  H35.3131   6. Combined forms of age-related cataract of both eyes  H25.813   7. Diplopia  H53.2      1. Retinal tear, OS     - tear located at 0230 with pigmented demarcation line, but no SRF  - s/p laser retinopexy (02.12.20) -- excellent laser in place  - stable  - f/u in 1 year, sooner prn  2. History of retinal tear OD  - operculated hole at 1030  - s/p laser retinopexy w/ Dr. Sherlynn Stalls 2013  - stable  3,4. Epiretinal membrane, OU (OS>OD)  - OS with mild macular pucker superonasal macula -- BCVA 20/20 OU  - mild ERM OU  - asymptomatic, no metamorphopsia  - no indication for surgery at this time  - monitor  5. Age related macular degeneration, non-exudative, both eyes  - early stage  -  The incidence, anatomy, and pathology of dry AMD, risk of progression, and the AREDS and AREDS 2 study including smoking risks discussed with patient.  - recommend amsler grid monitoring  6. Mixed form age related cataract OU  - The symptoms of cataract, surgical options, and treatments and risks were discussed with patient.  - discussed diagnosis and progression  - ?progression and early visual significance OS causing new symptoms OS (see below)  - under the expert management of Dr. Shirleen Schirmer  7. Intermittent monocular diplopia and blurred vision OS -- improving  - pt reported intermittent monocular diplopia OS -- images sometimes skewed, sometimes vertical  - during visual acuity testing OS, improved with pinhole  - during EOM testing -- nonpresent in primary gaze and elicited in extreme gazes  - suspect may be related to mild progression of cataract OS  - retina stable  - under the expert management of Dr. Shirleen Schirmer -- symptoms have improved with aggressive treatment of dry eyes   Ophthalmic Meds Ordered this visit:  No orders of the defined types were placed in this encounter.      Return in about 1 year (around 01/17/2021) for f/u non-exu ARMD OU, DFE, OCT.  There are no Patient Instructions on file for this visit.   Explained the diagnoses, plan, and follow up with the patient and they expressed understanding.  Patient expressed understanding of the importance of proper follow up care.   This document serves as a record of services personally performed by Gardiner Sleeper, MD, PhD. It was created on their behalf by Ernest Mallick, OA, an ophthalmic assistant. The creation of this record is the provider's dictation and/or activities during the visit.    Electronically signed by: Ernest Mallick, OA 02.24.2021 5:19 PM   Gardiner Sleeper, M.D., Ph.D. Diseases & Surgery of the Retina and Vitreous [facility]  I have reviewed the above documentation for accuracy  and completeness, and I agree with the above. Gardiner Sleeper, M.D., Ph.D. 01/18/20 5:19 PM   Abbreviations: M myopia (nearsighted); A astigmatism; H hyperopia (farsighted); P presbyopia; Mrx spectacle prescription;  CTL contact lenses; OD right eye; OS left eye; OU both eyes  XT exotropia; ET esotropia; PEK punctate epithelial keratitis; PEE punctate epithelial erosions; DES dry eye syndrome; MGD meibomian gland dysfunction; ATs artificial tears; PFAT's preservative free artificial tears; [place] nuclear sclerotic cataract; PSC posterior subcapsular cataract; ERM epi-retinal membrane; PVD posterior vitreous detachment; RD retinal detachment; DM diabetes mellitus; DR diabetic retinopathy; NPDR non-proliferative diabetic retinopathy; PDR proliferative diabetic retinopathy; CSME clinically significant macular edema; DME diabetic macular edema; dbh dot blot hemorrhages; CWS cotton wool spot; POAG primary open angle glaucoma; C/D cup-to-disc ratio; HVF humphrey visual field; GVF goldmann visual field; OCT optical coherence tomography; IOP intraocular pressure; BRVO Branch retinal vein occlusion; CRVO central retinal vein occlusion; CRAO central retinal artery occlusion; BRAO branch retinal artery occlusion; RT retinal tear; SB scleral buckle; PPV pars plana vitrectomy; VH Vitreous hemorrhage; PRP panretinal laser photocoagulation; IVK intravitreal kenalog; VMT vitreomacular traction; MH Macular hole;  NVD neovascularization of the disc; NVE neovascularization elsewhere; AREDS age related eye disease study; ARMD age related macular degeneration; POAG primary open angle glaucoma; EBMD epithelial/anterior basement membrane dystrophy; ACIOL anterior chamber intraocular lens; IOL intraocular lens; PCIOL posterior chamber intraocular lens; Phaco/IOL phacoemulsification with intraocular lens placement; [place] photorefractive keratectomy; LASIK laser assisted in situ keratomileusis; HTN hypertension; DM diabetes mellitus; COPD  chronic obstructive pulmonary disease

## 2020-01-18 ENCOUNTER — Encounter (INDEPENDENT_AMBULATORY_CARE_PROVIDER_SITE_OTHER): Payer: Self-pay | Admitting: Ophthalmology

## 2020-01-18 ENCOUNTER — Ambulatory Visit (INDEPENDENT_AMBULATORY_CARE_PROVIDER_SITE_OTHER): Payer: Medicare PPO | Admitting: Ophthalmology

## 2020-01-18 ENCOUNTER — Other Ambulatory Visit: Payer: Self-pay

## 2020-01-18 DIAGNOSIS — H25813 Combined forms of age-related cataract, bilateral: Secondary | ICD-10-CM

## 2020-01-18 DIAGNOSIS — H532 Diplopia: Secondary | ICD-10-CM

## 2020-01-18 DIAGNOSIS — H3581 Retinal edema: Secondary | ICD-10-CM

## 2020-01-18 DIAGNOSIS — Z9889 Other specified postprocedural states: Secondary | ICD-10-CM | POA: Diagnosis not present

## 2020-01-18 DIAGNOSIS — H33312 Horseshoe tear of retina without detachment, left eye: Secondary | ICD-10-CM | POA: Diagnosis not present

## 2020-01-18 DIAGNOSIS — H35373 Puckering of macula, bilateral: Secondary | ICD-10-CM

## 2020-01-18 DIAGNOSIS — H353131 Nonexudative age-related macular degeneration, bilateral, early dry stage: Secondary | ICD-10-CM

## 2020-03-10 ENCOUNTER — Telehealth: Payer: Self-pay | Admitting: Family Medicine

## 2020-03-10 ENCOUNTER — Other Ambulatory Visit: Payer: Self-pay | Admitting: Emergency Medicine

## 2020-03-10 NOTE — Telephone Encounter (Signed)
Need to be seen for refills 

## 2020-03-10 NOTE — Telephone Encounter (Signed)
Patient came into the office today requesting refills on the following medications.   levothyroxine (SYNTHROID, LEVOTHROID) 25 MCG tablet   meloxicam (MOBIC) 15 MG tablet  pantoprazole (PROTONIX) 40 MG tablet    [facility] - [place],  - [address] Phone:  [phone]  Fax:  [phone]

## 2020-03-11 ENCOUNTER — Other Ambulatory Visit: Payer: Self-pay | Admitting: Emergency Medicine

## 2020-03-11 DIAGNOSIS — M810 Age-related osteoporosis without current pathological fracture: Secondary | ICD-10-CM

## 2020-03-11 DIAGNOSIS — E039 Hypothyroidism, unspecified: Secondary | ICD-10-CM

## 2020-03-11 DIAGNOSIS — K219 Gastro-esophageal reflux disease without esophagitis: Secondary | ICD-10-CM

## 2020-03-11 MED ORDER — PANTOPRAZOLE SODIUM 40 MG PO TBEC: 40.0000 mg | DELAYED_RELEASE_TABLET | Freq: Every day | ORAL | 1 refills | Status: DC

## 2020-03-11 MED ORDER — LEVOTHYROXINE SODIUM 25 MCG PO TABS: 25.0000 ug | ORAL_TABLET | Freq: Every day | ORAL | 1 refills | Status: DC

## 2020-03-11 MED ORDER — MELOXICAM 15 MG PO TABS: 15.0000 mg | ORAL_TABLET | Freq: Every day | ORAL | 1 refills | Status: DC

## 2020-03-11 NOTE — Telephone Encounter (Signed)
Rx has been sent  

## 2020-03-11 NOTE — Telephone Encounter (Signed)
Informed pt. She states that when she was seen in November she was told by Dr. Judee Clara it would be no problem to have the medication refilled without having to come back in. Check with Dr. Judee Clara.

## 2020-03-31 ENCOUNTER — Encounter: Payer: Self-pay | Admitting: Nurse Practitioner

## 2020-03-31 ENCOUNTER — Ambulatory Visit (INDEPENDENT_AMBULATORY_CARE_PROVIDER_SITE_OTHER): Payer: Medicare PPO | Admitting: Nurse Practitioner

## 2020-03-31 ENCOUNTER — Other Ambulatory Visit: Payer: Self-pay

## 2020-03-31 VITALS — BP 120/84 | HR 93 | Temp 97.9°F | Resp 18 | Ht 62.0 in | Wt 149.8 lb

## 2020-03-31 DIAGNOSIS — Z0001 Encounter for general adult medical examination with abnormal findings: Secondary | ICD-10-CM

## 2020-03-31 DIAGNOSIS — K219 Gastro-esophageal reflux disease without esophagitis: Secondary | ICD-10-CM | POA: Diagnosis not present

## 2020-03-31 DIAGNOSIS — E039 Hypothyroidism, unspecified: Secondary | ICD-10-CM

## 2020-03-31 DIAGNOSIS — Z13220 Encounter for screening for lipoid disorders: Secondary | ICD-10-CM | POA: Diagnosis not present

## 2020-03-31 DIAGNOSIS — Z0000 Encounter for general adult medical examination without abnormal findings: Secondary | ICD-10-CM

## 2020-03-31 DIAGNOSIS — Z7689 Persons encountering health services in other specified circumstances: Secondary | ICD-10-CM

## 2020-03-31 DIAGNOSIS — Z13228 Encounter for screening for other metabolic disorders: Secondary | ICD-10-CM

## 2020-03-31 HISTORY — DX: Hypothyroidism, unspecified: E03.9

## 2020-03-31 LAB — COMPLETE METABOLIC PANEL WITH GFR
AG Ratio: 2.2 (calc) (ref 1.0–2.5)
ALT: 11 U/L (ref 6–29)
AST: 12 U/L (ref 10–35)
Albumin: 4.2 g/dL (ref 3.6–5.1)
Alkaline phosphatase (APISO): 56 U/L (ref 37–153)
BUN: 21 mg/dL (ref 7–25)
CO2: 28 mmol/L (ref 20–32)
Calcium: 9.5 mg/dL (ref 8.6–10.4)
Chloride: 107 mmol/L (ref 98–110)
Creat: 0.81 mg/dL (ref 0.60–0.93)
GFR, Est African American: 85 mL/min/{1.73_m2} (ref >=60)
GFR, Est Non African American: 73 mL/min/{1.73_m2} (ref >=60)
Globulin: 1.9 g/dL (calc) (ref 1.9–3.7)
Glucose, Bld: 91 mg/dL (ref 65–99)
Potassium: 4.2 mmol/L (ref 3.5–5.3)
Sodium: 145 mmol/L (ref 135–146)
Total Bilirubin: 1.1 mg/dL (ref 0.2–1.2)
Total Protein: 6.1 g/dL (ref 6.1–8.1)

## 2020-03-31 LAB — CBC WITH DIFFERENTIAL/PLATELET
Absolute Monocytes: 489 cells/uL (ref 200–950)
Basophils Absolute: 44 cells/uL (ref 0–200)
Basophils Relative: 0.6 %
Eosinophils Absolute: 190 cells/uL (ref 15–500)
Eosinophils Relative: 2.6 %
HCT: 40.8 % (ref 35.0–45.0)
Hemoglobin: 13.9 g/dL (ref 11.7–15.5)
Lymphs Abs: 1891 cells/uL (ref 850–3900)
MCH: 31.8 pg (ref 27.0–33.0)
MCHC: 34.1 g/dL (ref 32.0–36.0)
MCV: 93.4 fL (ref 80.0–100.0)
MPV: 10.6 fL (ref 7.5–12.5)
Monocytes Relative: 6.7 %
Neutro Abs: 4687 cells/uL (ref 1500–7800)
Neutrophils Relative %: 64.2 %
Platelets: 295 10*3/uL (ref 140–400)
RBC: 4.37 10*6/uL (ref 3.80–5.10)
RDW: 12.3 % (ref 11.0–15.0)
Total Lymphocyte: 25.9 %
WBC: 7.3 10*3/uL (ref 3.8–10.8)

## 2020-03-31 LAB — LIPID PANEL
Cholesterol: 188 mg/dL (ref <200)
HDL: 54 mg/dL (ref >=50)
LDL Cholesterol (Calc): 98 mg/dL (calc)
Non-HDL Cholesterol (Calc): 134 mg/dL (calc) — ABNORMAL HIGH (ref <130)
Total CHOL/HDL Ratio: 3.5 (calc) (ref <5.0)
Triglycerides: 235 mg/dL — ABNORMAL HIGH (ref <150)

## 2020-03-31 LAB — TSH: TSH: 1.9 mIU/L (ref 0.40–4.50)

## 2020-03-31 LAB — T4, FREE: Free T4: 1.4 ng/dL (ref 0.8–1.8)

## 2020-03-31 NOTE — Progress Notes (Signed)
New Patient Office Visit  Subjective:  Patient ID: Gina Gibbs, female    DOB: [birth year]  Age: 74 y.o. MRN: [id number]  CC:  Chief Complaint  Patient presents with  . Establish Care    HPI Gina Gibbs is a 74 year old female presenting to establish care. She was a pt of Dr Luan Pulling and Holly Bodily. She does not need refill on medications. No cp/ct,  Covid vaccine x 2 completed.  Pneumonia vaccine completed prior Hep C screening completed prior cologuard q3 years last completed 09/2019 Dexa ordered 09/2019 not completed pt will call to schedule Mammo and Pelvic exam per GYN per pt UTD.   Past Medical History:  Diagnosis Date  . Arthritis   . Cataract    Mixed forms OU  . Complication of anesthesia   . GERD (gastroesophageal reflux disease) 02/28/2013  . Hyperlipidemia 02/28/2013  . Hypothyroid   . Hypothyroid 09/26/2013  . Hypothyroidism 03/31/2020  . Macular degeneration    non-exu OU  . Osteoporosis   . PONV (postoperative nausea and vomiting)     Past Surgical History:  Procedure Laterality Date  . ABDOMINAL HYSTERECTOMY    . EAR CYST EXCISION  02/23/2012   Procedure: CYST REMOVAL;  Surgeon: Wynonia Sours, MD;  Location: [place];  Service: Orthopedics;  Laterality: Left;  debridement distal interphalangeal left index finger, excision cyst/loose body  . FRACTURE SURGERY  09/2013   right ankle  . spinal cyst removal      Family History  Problem Relation Age of Onset  . Diabetes Mother     Social History   Socioeconomic History  . Marital status: Married    Spouse name: Not on file  . Number of children: Not on file  . Years of education: Not on file  . Highest education level: Not on file  Occupational History  . Occupation: retired  Tobacco Use  . Smoking status: Never Smoker  . Smokeless tobacco: Never Used  Substance and Sexual Activity  . Alcohol use: Yes    Comment: rare  . Drug use: No  . Sexual activity: Never    Partners:  Male    Birth control/protection: Surgical    Comment: hyst  Other Topics Concern  . Not on file  Social History Narrative  . Not on file   Social Determinants of Health   Financial Resource Strain:   . Difficulty of Paying Living Expenses:   Food Insecurity:   . Worried About Charity fundraiser in the Last Year:   . Arboriculturist in the Last Year:   Transportation Needs:   . Film/video editor (Medical):   Marland Kitchen Lack of Transportation (Non-Medical):   Physical Activity:   . Days of Exercise per Week:   . Minutes of Exercise per Session:   Stress:   . Feeling of Stress :   Social Connections:   . Frequency of Communication with Friends and Family:   . Frequency of Social Gatherings with Friends and Family:   . Attends Religious Services:   . Active Member of Clubs or Organizations:   . Attends Archivist Meetings:   Marland Kitchen Marital Status:   Intimate Partner Violence:   . Fear of Current or Ex-Partner:   . Emotionally Abused:   Marland Kitchen Physically Abused:   . Sexually Abused:     ROS Review of Systems  All other systems reviewed and are negative.   Objective:   Today's  Vitals: BP 120/84 (BP Location: Left Arm, Patient Position: Sitting, Cuff Size: Normal)   Pulse 93   Temp 97.9 F (36.6 C) (Temporal)   Resp 18   Ht 5\' 2"  (1.575 m)   Wt 149 lb 12.8 oz (67.9 kg)   SpO2 98%   BMI 27.40 kg/m   Physical Exam Vitals and nursing note reviewed.  Constitutional:      Appearance: Normal appearance. She is well-developed and well-groomed. She is not ill-appearing.  HENT:     Head: Normocephalic and atraumatic.     Right Ear: Hearing, tympanic membrane, ear canal and external ear normal.     Left Ear: Hearing, tympanic membrane, ear canal and external ear normal.     Nose: Nose normal. No congestion or rhinorrhea.     Mouth/Throat:     Lips: Pink.     Mouth: Mucous membranes are moist.     Pharynx: Oropharynx is clear.  Eyes:     General: Lids are normal. Lids  are everted, no foreign bodies appreciated.     Extraocular Movements: Extraocular movements intact.     Conjunctiva/sclera: Conjunctivae normal.     Pupils: Pupils are equal, round, and reactive to light.  Neck:     Thyroid: No thyromegaly.     Vascular: No carotid bruit or JVD.  Cardiovascular:     Rate and Rhythm: Normal rate and regular rhythm.     Pulses: Normal pulses.     Heart sounds: Normal heart sounds, S1 normal and S2 normal.  Pulmonary:     Effort: Pulmonary effort is normal.     Breath sounds: Normal breath sounds.  Abdominal:     General: Abdomen is flat. Bowel sounds are normal. There is no abdominal bruit.     Palpations: There is no hepatomegaly or splenomegaly.  Musculoskeletal:        General: Normal range of motion.     Cervical back: Full passive range of motion without pain, normal range of motion and neck supple.     Right lower leg: No edema.     Left lower leg: No edema.  Lymphadenopathy:     Cervical: No cervical adenopathy.  Skin:    General: Skin is warm.     Capillary Refill: Capillary refill takes less than 2 seconds.  Neurological:     General: No focal deficit present.     Mental Status: She is alert and oriented to person, place, and time. Mental status is at baseline.  Psychiatric:        Attention and Perception: Attention normal.        Mood and Affect: Mood normal.        Speech: Speech normal.        Behavior: Behavior normal. Behavior is cooperative.        Thought Content: Thought content normal.        Cognition and Memory: Cognition normal.        Judgment: Judgment normal.     Assessment & Plan:  General exam completed Labs pending bp stable  Problem List Items Addressed This Visit      Digestive   GERD (gastroesophageal reflux disease)   Relevant Orders   CBC with Differential/Platelet   COMPLETE METABOLIC PANEL WITH GFR     Endocrine   Hypothyroidism   Relevant Orders   T4, free   TSH    Other Visit Diagnoses     General medical examination    -  Primary  Screening, lipid       Relevant Orders   Lipid panel   Screening for metabolic disorder       Relevant Orders   CBC with Differential/Platelet   COMPLETE METABOLIC PANEL WITH GFR   Encounter to establish care          Outpatient Encounter Medications as of 03/31/2020  Medication Sig  . levothyroxine (SYNTHROID) 25 MCG tablet Take 1 tablet (25 mcg total) by mouth daily.  . meloxicam (MOBIC) 15 MG tablet Take 1 tablet (15 mg total) by mouth daily.  . pantoprazole (PROTONIX) 40 MG tablet Take 1 tablet (40 mg total) by mouth daily.  Marland Kitchen therapeutic multivitamin-minerals (THERAGRAN-M) tablet Take 1 tablet by mouth daily.   No facility-administered encounter medications on file as of 03/31/2020.    Follow-up: Return in about 6 months (around 10/01/2020) for labs one week prior.   Elmore Guise, FNP

## 2020-04-07 ENCOUNTER — Other Ambulatory Visit: Payer: Self-pay | Admitting: Nurse Practitioner

## 2020-04-07 NOTE — Progress Notes (Signed)
Pts triglycerides are elevated. She should follow low fat, low cholesterol diet and consider taking OTC Omega 3 or fish oil supplement 1000 mg a day. We should recheck her levels in 6 months. Have her make apt and complete labs one week prior.

## 2020-06-05 ENCOUNTER — Encounter (HOSPITAL_COMMUNITY): Payer: Self-pay

## 2020-06-05 ENCOUNTER — Other Ambulatory Visit: Payer: Self-pay

## 2020-06-05 ENCOUNTER — Encounter (HOSPITAL_COMMUNITY)
Admission: RE | Admit: 2020-06-05 | Discharge: 2020-06-05 | Disposition: A | Discharge status: Home / Self Care | Payer: Medicare PPO | Source: Ambulatory Visit | Attending: Nurse Practitioner | Admitting: Nurse Practitioner

## 2020-06-05 ENCOUNTER — Encounter (HOSPITAL_COMMUNITY): Payer: Medicare Other

## 2020-06-05 DIAGNOSIS — M810 Age-related osteoporosis without current pathological fracture: Secondary | ICD-10-CM | POA: Insufficient documentation

## 2020-06-05 LAB — POCT I-STAT, CHEM 8
BUN: 14 mg/dL (ref 8–23)
Calcium, Ion: 1.26 mmol/L (ref 1.15–1.40)
Chloride: 105 mmol/L (ref 98–111)
Creatinine, Ser: 0.8 mg/dL (ref 0.44–1.00)
Glucose, Bld: 98 mg/dL (ref 70–99)
HCT: 40 % (ref 36.0–46.0)
Hemoglobin: 13.6 g/dL (ref 12.0–15.0)
Potassium: 3.8 mmol/L (ref 3.5–5.1)
Sodium: 143 mmol/L (ref 135–145)
TCO2: 26 mmol/L (ref 22–32)

## 2020-06-05 MED ORDER — SODIUM CHLORIDE 0.9 % IV SOLN: Freq: Once | INTRAVENOUS | Status: AC

## 2020-06-05 MED ORDER — ZOLEDRONIC ACID 5 MG/100ML IV SOLN
5.0000 mg | Freq: Once | INTRAVENOUS | Status: AC
  Administered 2020-06-05: 5 mg via INTRAVENOUS

## 2020-06-10 ENCOUNTER — Other Ambulatory Visit: Payer: Self-pay

## 2020-06-10 ENCOUNTER — Ambulatory Visit (INDEPENDENT_AMBULATORY_CARE_PROVIDER_SITE_OTHER): Payer: Medicare PPO | Admitting: Nurse Practitioner

## 2020-06-10 VITALS — BP 110/78 | HR 75 | Temp 97.6°F | Resp 18 | Wt 145.6 lb

## 2020-06-10 DIAGNOSIS — J0100 Acute maxillary sinusitis, unspecified: Secondary | ICD-10-CM | POA: Diagnosis not present

## 2020-06-10 MED ORDER — AMOXICILLIN-POT CLAVULANATE 875-125 MG PO TABS: 1.0000 | ORAL_TABLET | Freq: Two times a day (BID) | ORAL | 0 refills | Status: DC

## 2020-06-10 MED ORDER — FLUTICASONE PROPIONATE 50 MCG/ACT NA SUSP: 2.0000 | Freq: Every day | NASAL | 6 refills | Status: DC

## 2020-06-10 NOTE — Progress Notes (Signed)
Established Patient Office Visit  Subjective:  Patient ID: Gina Gibbs, female    DOB: July 06, 1946  Age: 74 y.o. MRN: [id number]  CC:  Chief Complaint  Patient presents with  . Sinus Problem    2 weeks, pressure in head, post nasal drip, lungs filled up, cough, no fever, zyrtec was taken    HPI Gina Gibbs is a 74 year old female presenting for sxs of sinus issues/sxs. Patient complains of clear rhinorrhea, nasal congestion, post nasal drip, puffiness of the eyes and sinus pressure. Onset of symptoms was 8 days ago. Symptoms have been gradually worsening since that time. She is drinking plenty of fluids.  Past history is significant for nothing. Patient is non-smoker. No sick contacts, no exposures to COVID, no loss of taste or smell, h/a, cough, fever, chills, or other contacts with similar sxs.   Past Medical History:  Diagnosis Date  . Arthritis   . Cataract    Mixed forms OU  . Complication of anesthesia   . GERD (gastroesophageal reflux disease) 02/28/2013  . Hyperlipidemia 02/28/2013  . Hypothyroid   . Hypothyroid 09/26/2013  . Hypothyroidism 03/31/2020  . Macular degeneration    non-exu OU  . Osteoporosis   . PONV (postoperative nausea and vomiting)     Past Surgical History:  Procedure Laterality Date  . ABDOMINAL HYSTERECTOMY    . EAR CYST EXCISION  02/23/2012   Procedure: CYST REMOVAL;  Surgeon: Nicki Reaper, MD;  Location: [facility];  Service: Orthopedics;  Laterality: Left;  debridement distal interphalangeal left index finger, excision cyst/loose body  . FRACTURE SURGERY  09/2013   right ankle  . spinal cyst removal      Family History  Problem Relation Age of Onset  . Diabetes Mother     Social History   Socioeconomic History  . Marital status: Married    Spouse name: Not on file  . Number of children: Not on file  . Years of education: Not on file  . Highest education level: Not on file  Occupational History  . Occupation:  retired  Tobacco Use  . Smoking status: Never Smoker  . Smokeless tobacco: Never Used  Vaping Use  . Vaping Use: Never used  Substance and Sexual Activity  . Alcohol use: Yes    Comment: rare  . Drug use: No  . Sexual activity: Never    Partners: Male    Birth control/protection: Surgical    Comment: hyst  Other Topics Concern  . Not on file  Social History Narrative  . Not on file   Social Determinants of Health   Financial Resource Strain:   . Difficulty of Paying Living Expenses:   Food Insecurity:   . Worried About Programme researcher, broadcasting/film/video in the Last Year:   . Barista in the Last Year:   Transportation Needs:   . Freight forwarder (Medical):   Marland Kitchen Lack of Transportation (Non-Medical):   Physical Activity:   . Days of Exercise per Week:   . Minutes of Exercise per Session:   Stress:   . Feeling of Stress :   Social Connections:   . Frequency of Communication with Friends and Family:   . Frequency of Social Gatherings with Friends and Family:   . Attends Religious Services:   . Active Member of Clubs or Organizations:   . Attends Banker Meetings:   Marland Kitchen Marital Status:   Intimate Partner Violence:   .  Fear of Current or Ex-Partner:   . Emotionally Abused:   Marland Kitchen Physically Abused:   . Sexually Abused:     Outpatient Medications Prior to Visit  Medication Sig Dispense Refill  . levothyroxine (SYNTHROID) 25 MCG tablet Take 1 tablet (25 mcg total) by mouth daily. 90 tablet 1  . meloxicam (MOBIC) 15 MG tablet Take 1 tablet (15 mg total) by mouth daily. 90 tablet 1  . Omega-3 Fatty Acids (FISH OIL) 1000 MG CAPS Take 1,000 mg by mouth.    . pantoprazole (PROTONIX) 40 MG tablet Take 1 tablet (40 mg total) by mouth daily. 90 tablet 1  . therapeutic multivitamin-minerals (THERAGRAN-M) tablet Take 1 tablet by mouth daily.     No facility-administered medications prior to visit.    Allergies  Allergen Reactions  . Tape     adhesives     ROS Review of Systems  All other systems reviewed and are negative.     Objective:    Physical Exam Vitals and nursing note reviewed.  Constitutional:      General: She is not in acute distress.    Appearance: Normal appearance. She is well-developed and well-groomed. She is not ill-appearing.  HENT:     Head: Normocephalic.     Right Ear: Hearing and external ear normal.     Left Ear: Hearing and external ear normal.     Nose: Congestion and rhinorrhea present.     Right Sinus: Maxillary sinus tenderness present. No frontal sinus tenderness.     Left Sinus: Maxillary sinus tenderness present. No frontal sinus tenderness.     Mouth/Throat:     Mouth: Mucous membranes are moist.     Pharynx: Oropharynx is clear. No oropharyngeal exudate or posterior oropharyngeal erythema.  Eyes:     General: Lids are normal. Lids are everted, no foreign bodies appreciated.     Extraocular Movements: Extraocular movements intact.     Conjunctiva/sclera: Conjunctivae normal.     Pupils: Pupils are equal, round, and reactive to light.  Neck:     Thyroid: No thyromegaly or thyroid tenderness.  Cardiovascular:     Rate and Rhythm: Normal rate and regular rhythm.     Pulses: Normal pulses.     Heart sounds: Normal heart sounds.  Pulmonary:     Effort: Pulmonary effort is normal.     Breath sounds: Normal breath sounds.  Abdominal:     General: Abdomen is flat. Bowel sounds are normal.     Palpations: Abdomen is soft.  Musculoskeletal:        General: Normal range of motion.     Cervical back: Full passive range of motion without pain, normal range of motion and neck supple.     Right lower leg: No edema.     Left lower leg: No edema.  Lymphadenopathy:     Cervical: No cervical adenopathy.  Skin:    General: Skin is warm and dry.     Coloration: Skin is not jaundiced or pale.  Neurological:     General: No focal deficit present.     Mental Status: She is alert and oriented to  person, place, and time.  Psychiatric:        Mood and Affect: Mood normal.        Behavior: Behavior normal. Behavior is cooperative.        Thought Content: Thought content normal.        Judgment: Judgment normal.     BP 110/78 (BP Location:  Left Arm, Patient Position: Sitting, Cuff Size: Normal)   Pulse 75   Temp 97.6 F (36.4 C) (Temporal)   Resp 18   Wt 145 lb 9.6 oz (66 kg)   SpO2 97%   BMI 26.63 kg/m  Wt Readings from Last 3 Encounters:  06/10/20 145 lb 9.6 oz (66 kg)  06/05/20 143 lb (64.9 kg)  03/31/20 149 lb 12.8 oz (67.9 kg)     There are no preventive care reminders to display for this patient.  There are no preventive care reminders to display for this patient.  Lab Results  Component Value Date   TSH 1.90 03/31/2020   Lab Results  Component Value Date   WBC 7.3 03/31/2020   HGB 13.6 06/05/2020   HCT 40.0 06/05/2020   MCV 93.4 03/31/2020   PLT 295 03/31/2020   Lab Results  Component Value Date   NA 143 06/05/2020   K 3.8 06/05/2020   CO2 28 03/31/2020   GLUCOSE 98 06/05/2020   BUN 14 06/05/2020   CREATININE 0.80 06/05/2020   BILITOT 1.1 03/31/2020   ALKPHOS 52 06/06/2019   AST 12 03/31/2020   ALT 11 03/31/2020   PROT 6.1 03/31/2020   ALBUMIN 4.6 06/06/2019   CALCIUM 9.5 03/31/2020   ANIONGAP 11 06/06/2019   Lab Results  Component Value Date   CHOL 188 03/31/2020   Lab Results  Component Value Date   HDL 54 03/31/2020   Lab Results  Component Value Date   LDLCALC 98 03/31/2020   Lab Results  Component Value Date   TRIG 235 (H) 03/31/2020   Lab Results  Component Value Date   CHOLHDL 3.5 03/31/2020   Lab Results  Component Value Date   HGBA1C 5.4 06/05/2018      Assessment & Plan:   Problem List Items Addressed This Visit    None    Visit Diagnoses    Acute non-recurrent maxillary sinusitis    -  Primary   Relevant Medications   fluticasone (FLONASE) 50 MCG/ACT nasal spray   amoxicillin-clavulanate (AUGMENTIN)  875-125 MG tablet    drink plenty of water May use humidifier and over the counter symptom reliever medications Take prescriptions as prescribed Get plenty of rest  Meds ordered this encounter  Medications  . fluticasone (FLONASE) 50 MCG/ACT nasal spray    Sig: Place 2 sprays into both nostrils daily.    Dispense:  16 g    Refill:  6  . amoxicillin-clavulanate (AUGMENTIN) 875-125 MG tablet    Sig: Take 1 tablet by mouth 2 (two) times daily.    Dispense:  20 tablet    Refill:  0    Follow-up: Return if symptoms worsen or fail to improve.    Elmore Guise, FNP

## 2020-06-11 NOTE — Patient Instructions (Signed)
Acute non-recurrent maxillary sinusitis    -  Primary   Relevant Medications   fluticasone (FLONASE) 50 MCG/ACT nasal spray   amoxicillin-clavulanate (AUGMENTIN) 875-125 MG tablet    drink plenty of water May use humidifier and over the counter symptom reliever medications Take prescriptions as prescribed Get plenty of rest

## 2020-06-17 ENCOUNTER — Other Ambulatory Visit: Payer: Self-pay | Admitting: Family Medicine

## 2020-06-17 DIAGNOSIS — K219 Gastro-esophageal reflux disease without esophagitis: Secondary | ICD-10-CM

## 2020-06-26 ENCOUNTER — Other Ambulatory Visit: Payer: Self-pay

## 2020-06-26 ENCOUNTER — Telehealth: Payer: Self-pay | Admitting: Nurse Practitioner

## 2020-06-26 DIAGNOSIS — K219 Gastro-esophageal reflux disease without esophagitis: Secondary | ICD-10-CM

## 2020-06-26 MED ORDER — PANTOPRAZOLE SODIUM 40 MG PO TBEC: 40.0000 mg | DELAYED_RELEASE_TABLET | Freq: Every day | ORAL | 1 refills | Status: DC

## 2020-06-26 NOTE — Telephone Encounter (Signed)
Pt needs to have refill on pantoprazole (PROTONIX) 40 MG tablet, orinigal written by Dorette Grate but has not switch over to [employer] and [place] from [facility]) pt is out of med.

## 2020-07-01 DIAGNOSIS — M19072 Primary osteoarthritis, left ankle and foot: Secondary | ICD-10-CM | POA: Diagnosis not present

## 2020-07-14 ENCOUNTER — Other Ambulatory Visit: Payer: Self-pay | Admitting: Family Medicine

## 2020-07-14 DIAGNOSIS — M810 Age-related osteoporosis without current pathological fracture: Secondary | ICD-10-CM

## 2020-07-16 DIAGNOSIS — M19072 Primary osteoarthritis, left ankle and foot: Secondary | ICD-10-CM | POA: Diagnosis not present

## 2020-07-21 DIAGNOSIS — Z4689 Encounter for fitting and adjustment of other specified devices: Secondary | ICD-10-CM | POA: Diagnosis not present

## 2020-07-22 ENCOUNTER — Other Ambulatory Visit: Payer: Self-pay

## 2020-07-22 DIAGNOSIS — E039 Hypothyroidism, unspecified: Secondary | ICD-10-CM

## 2020-07-22 DIAGNOSIS — M810 Age-related osteoporosis without current pathological fracture: Secondary | ICD-10-CM

## 2020-07-22 MED ORDER — MELOXICAM 15 MG PO TABS: 15.0000 mg | ORAL_TABLET | Freq: Every day | ORAL | 1 refills | Status: DC

## 2020-07-22 NOTE — Telephone Encounter (Signed)
Requested Prescriptions   Pending Prescriptions Disp Refills  . meloxicam (MOBIC) 15 MG tablet 90 tablet 1    Sig: Take 1 tablet (15 mg total) by mouth daily.     Last OV 06/10/2020   Last written 03/11/2020  Pt called stating that she is completely out and would like a refill.

## 2020-08-18 ENCOUNTER — Other Ambulatory Visit (HOSPITAL_COMMUNITY): Payer: Self-pay | Admitting: Family Medicine

## 2020-08-18 DIAGNOSIS — Z1231 Encounter for screening mammogram for malignant neoplasm of breast: Secondary | ICD-10-CM

## 2020-09-04 DIAGNOSIS — R32 Unspecified urinary incontinence: Secondary | ICD-10-CM | POA: Diagnosis not present

## 2020-09-04 DIAGNOSIS — E039 Hypothyroidism, unspecified: Secondary | ICD-10-CM | POA: Diagnosis not present

## 2020-09-04 DIAGNOSIS — Z8249 Family history of ischemic heart disease and other diseases of the circulatory system: Secondary | ICD-10-CM | POA: Diagnosis not present

## 2020-09-04 DIAGNOSIS — Z791 Long term (current) use of non-steroidal anti-inflammatories (NSAID): Secondary | ICD-10-CM | POA: Diagnosis not present

## 2020-09-04 DIAGNOSIS — K219 Gastro-esophageal reflux disease without esophagitis: Secondary | ICD-10-CM | POA: Diagnosis not present

## 2020-09-04 DIAGNOSIS — J309 Allergic rhinitis, unspecified: Secondary | ICD-10-CM | POA: Diagnosis not present

## 2020-09-04 DIAGNOSIS — Z79899 Other long term (current) drug therapy: Secondary | ICD-10-CM | POA: Diagnosis not present

## 2020-09-04 DIAGNOSIS — Z7982 Long term (current) use of aspirin: Secondary | ICD-10-CM | POA: Diagnosis not present

## 2020-09-18 ENCOUNTER — Ambulatory Visit (HOSPITAL_COMMUNITY)
Admission: RE | Admit: 2020-09-18 | Discharge: 2020-09-18 | Disposition: A | Discharge status: Home / Self Care | Payer: Medicare PPO | Source: Ambulatory Visit | Attending: Family Medicine | Admitting: Family Medicine

## 2020-09-18 ENCOUNTER — Other Ambulatory Visit: Payer: Self-pay

## 2020-09-18 DIAGNOSIS — Z1231 Encounter for screening mammogram for malignant neoplasm of breast: Secondary | ICD-10-CM | POA: Diagnosis not present

## 2020-09-24 DIAGNOSIS — Z4689 Encounter for fitting and adjustment of other specified devices: Secondary | ICD-10-CM | POA: Diagnosis not present

## 2020-09-24 DIAGNOSIS — N8110 Cystocele, unspecified: Secondary | ICD-10-CM | POA: Diagnosis not present

## 2020-09-25 ENCOUNTER — Other Ambulatory Visit: Payer: Medicare PPO

## 2020-09-25 ENCOUNTER — Other Ambulatory Visit: Payer: Self-pay

## 2020-09-25 DIAGNOSIS — Z0000 Encounter for general adult medical examination without abnormal findings: Secondary | ICD-10-CM | POA: Diagnosis not present

## 2020-09-26 LAB — LIPID PANEL
Cholesterol: 199 mg/dL (ref <200)
HDL: 55 mg/dL (ref >=50)
LDL Cholesterol (Calc): 121 mg/dL (calc) — ABNORMAL HIGH
Non-HDL Cholesterol (Calc): 144 mg/dL (calc) — ABNORMAL HIGH (ref <130)
Total CHOL/HDL Ratio: 3.6 (calc) (ref <5.0)
Triglycerides: 124 mg/dL (ref <150)

## 2020-09-26 LAB — COMPLETE METABOLIC PANEL WITH GFR
AG Ratio: 2.3 (calc) (ref 1.0–2.5)
ALT: 12 U/L (ref 6–29)
AST: 12 U/L (ref 10–35)
Albumin: 4.2 g/dL (ref 3.6–5.1)
Alkaline phosphatase (APISO): 50 U/L (ref 37–153)
BUN: 17 mg/dL (ref 7–25)
CO2: 25 mmol/L (ref 20–32)
Calcium: 9.3 mg/dL (ref 8.6–10.4)
Chloride: 107 mmol/L (ref 98–110)
Creat: 0.77 mg/dL (ref 0.60–0.93)
GFR, Est African American: 89 mL/min/{1.73_m2} (ref >=60)
GFR, Est Non African American: 77 mL/min/{1.73_m2} (ref >=60)
Globulin: 1.8 g/dL (calc) — ABNORMAL LOW (ref 1.9–3.7)
Glucose, Bld: 94 mg/dL (ref 65–99)
Potassium: 4.3 mmol/L (ref 3.5–5.3)
Sodium: 142 mmol/L (ref 135–146)
Total Bilirubin: 1.5 mg/dL — ABNORMAL HIGH (ref 0.2–1.2)
Total Protein: 6 g/dL — ABNORMAL LOW (ref 6.1–8.1)

## 2020-09-26 LAB — CBC WITH DIFFERENTIAL/PLATELET
Absolute Monocytes: 576 cells/uL (ref 200–950)
Basophils Absolute: 48 cells/uL (ref 0–200)
Basophils Relative: 0.6 %
Eosinophils Absolute: 192 cells/uL (ref 15–500)
Eosinophils Relative: 2.4 %
HCT: 41.4 % (ref 35.0–45.0)
Hemoglobin: 13.8 g/dL (ref 11.7–15.5)
Lymphs Abs: 2440 cells/uL (ref 850–3900)
MCH: 31.7 pg (ref 27.0–33.0)
MCHC: 33.3 g/dL (ref 32.0–36.0)
MCV: 95.2 fL (ref 80.0–100.0)
MPV: 10.1 fL (ref 7.5–12.5)
Monocytes Relative: 7.2 %
Neutro Abs: 4744 cells/uL (ref 1500–7800)
Neutrophils Relative %: 59.3 %
Platelets: 307 10*3/uL (ref 140–400)
RBC: 4.35 10*6/uL (ref 3.80–5.10)
RDW: 12.5 % (ref 11.0–15.0)
Total Lymphocyte: 30.5 %
WBC: 8 10*3/uL (ref 3.8–10.8)

## 2020-09-26 LAB — TSH: TSH: 3.55 mIU/L (ref 0.40–4.50)

## 2020-10-01 ENCOUNTER — Ambulatory Visit: Payer: Medicare PPO | Admitting: Nurse Practitioner

## 2020-10-02 ENCOUNTER — Ambulatory Visit: Payer: Medicare PPO | Admitting: Family Medicine

## 2020-10-02 ENCOUNTER — Other Ambulatory Visit: Payer: Self-pay

## 2020-10-02 VITALS — BP 130/84 | HR 86 | Temp 97.5°F | Ht 62.0 in | Wt 145.0 lb

## 2020-10-02 DIAGNOSIS — Z23 Encounter for immunization: Secondary | ICD-10-CM

## 2020-10-02 DIAGNOSIS — M810 Age-related osteoporosis without current pathological fracture: Secondary | ICD-10-CM | POA: Diagnosis not present

## 2020-10-02 DIAGNOSIS — E039 Hypothyroidism, unspecified: Secondary | ICD-10-CM | POA: Diagnosis not present

## 2020-10-02 NOTE — Progress Notes (Signed)
Subjective:    Patient ID: Gina Gibbs, female    DOB: 1946/02/12, 74 y.o.   MRN: [id number]  HPI  Patient is a very pleasant 74 year old Caucasian female here today for a checkup.  Past medical history significant for osteoporosis.  She is on Reclast.  This is her fifth year taking Reclast.  In the past she took Fosamax and tried it on 2 separate occasions but had to stop both times due to acid reflux.  She is taking vitamin D.  She is not taking calcium.  She is due for a flu shot today.  She also has hypothyroidism and is on levothyroxine.  Her most recent lab work is listed below: Lab on 09/25/2020  Component Date Value Ref Range Status  . WBC 09/25/2020 8.0  3.8 - 10.8 Thousand/uL Final  . RBC 09/25/2020 4.35  3.80 - 5.10 Million/uL Final  . Hemoglobin 09/25/2020 13.8  11.7 - 15.5 g/dL Final  . HCT 40/98/1191 41.4  35 - 45 % Final  . MCV 09/25/2020 95.2  80.0 - 100.0 fL Final  . MCH 09/25/2020 31.7  27.0 - 33.0 pg Final  . MCHC 09/25/2020 33.3  32.0 - 36.0 g/dL Final  . RDW 47/82/9562 12.5  11.0 - 15.0 % Final  . Platelets 09/25/2020 307  140 - 400 Thousand/uL Final  . MPV 09/25/2020 10.1  7.5 - 12.5 fL Final  . Neutro Abs 09/25/2020 4,744  1,500 - 7,800 cells/uL Final  . Lymphs Abs 09/25/2020 2,440  850 - 3,900 cells/uL Final  . Absolute Monocytes 09/25/2020 576  200 - 950 cells/uL Final  . Eosinophils Absolute 09/25/2020 192  15.0 - 500.0 cells/uL Final  . Basophils Absolute 09/25/2020 48  0.0 - 200.0 cells/uL Final  . Neutrophils Relative % 09/25/2020 59.3  % Final  . Total Lymphocyte 09/25/2020 30.5  % Final  . Monocytes Relative 09/25/2020 7.2  % Final  . Eosinophils Relative 09/25/2020 2.4  % Final  . Basophils Relative 09/25/2020 0.6  % Final  . Cholesterol 09/25/2020 199  <200 mg/dL Final  . HDL 13/06/6577 55  > OR = 50 mg/dL Final  . Triglycerides 09/25/2020 124  <150 mg/dL Final  . LDL Cholesterol (Calc) 09/25/2020 121* mg/dL (calc) Final   Comment: Reference  range: <100 . Desirable range <100 mg/dL for primary prevention;   <70 mg/dL for patients with CHD or diabetic patients  with > or = 2 CHD risk factors. Marland Kitchen LDL-C is now calculated using the Martin-Hopkins  calculation, which is a validated novel method providing  better accuracy than the Friedewald equation in the  estimation of LDL-C.  Horald Pollen et al. Lenox Ahr. 4696;295(28): 2061-2068  (http://education.[facility].com/faq/FAQ164)   . Total CHOL/HDL Ratio 09/25/2020 3.6  <4.1 (calc) Final  . Non-HDL Cholesterol (Calc) 09/25/2020 144* <130 mg/dL (calc) Final   Comment: For patients with diabetes plus 1 major ASCVD risk  factor, treating to a non-HDL-C goal of <100 mg/dL  (LDL-C of <32 mg/dL) is considered a therapeutic  option.   . Glucose, Bld 09/25/2020 94  65 - 99 mg/dL Final   Comment: .            Fasting reference interval .   . BUN 09/25/2020 17  7 - 25 mg/dL Final  . Creat 44/11/270 0.77  0.60 - 0.93 mg/dL Final   Comment: For patients >90 years of age, the reference limit for Creatinine is approximately 13% higher for people identified as African-American. Marland Kitchen   Marland Kitchen  GFR, Est Non African American 09/25/2020 77  > OR = 60 mL/min/1.5m2 Final  . GFR, Est African American 09/25/2020 89  > OR = 60 mL/min/1.43m2 Final  . BUN/Creatinine Ratio 09/25/2020 NOT APPLICABLE  6 - 22 (calc) Final  . Sodium 09/25/2020 142  135 - 146 mmol/L Final  . Potassium 09/25/2020 4.3  3.5 - 5.3 mmol/L Final  . Chloride 09/25/2020 107  98 - 110 mmol/L Final  . CO2 09/25/2020 25  20 - 32 mmol/L Final  . Calcium 09/25/2020 9.3  8.6 - 10.4 mg/dL Final  . Total Protein 09/25/2020 6.0* 6.1 - 8.1 g/dL Final  . Albumin 60/63/0160 4.2  3.6 - 5.1 g/dL Final  . Globulin 10/93/2355 1.8* 1.9 - 3.7 g/dL (calc) Final  . AG Ratio 09/25/2020 2.3  1.0 - 2.5 (calc) Final  . Total Bilirubin 09/25/2020 1.5* 0.2 - 1.2 mg/dL Final  . Alkaline phosphatase (APISO) 09/25/2020 50  37 - 153 U/L Final  . AST 09/25/2020  12  10 - 35 U/L Final  . ALT 09/25/2020 12  6 - 29 U/L Final  . TSH 09/25/2020 3.55  0.40 - 4.50 mIU/L Final   Past Medical History:  Diagnosis Date  . Arthritis   . Cataract    Mixed forms OU  . Complication of anesthesia   . GERD (gastroesophageal reflux disease) 02/28/2013  . Hyperlipidemia 02/28/2013  . Hypothyroid   . Hypothyroid 09/26/2013  . Hypothyroidism 03/31/2020  . Macular degeneration    non-exu OU  . Osteoporosis   . PONV (postoperative nausea and vomiting)    Past Surgical History:  Procedure Laterality Date  . ABDOMINAL HYSTERECTOMY    . EAR CYST EXCISION  02/23/2012   Procedure: CYST REMOVAL;  Surgeon: Nicki Reaper, MD;  Location: [facility];  Service: Orthopedics;  Laterality: Left;  debridement distal interphalangeal left index finger, excision cyst/loose body  . FRACTURE SURGERY  09/2013   right ankle  . spinal cyst removal     Current Outpatient Medications on File Prior to Visit  Medication Sig Dispense Refill  . fluticasone (FLONASE) 50 MCG/ACT nasal spray Place 2 sprays into both nostrils daily. 16 g 6  . levothyroxine (SYNTHROID) 25 MCG tablet Take 1 tablet (25 mcg total) by mouth daily. 90 tablet 1  . meloxicam (MOBIC) 15 MG tablet Take 1 tablet (15 mg total) by mouth daily. 90 tablet 1  . Omega-3 Fatty Acids (FISH OIL) 1000 MG CAPS Take 1,000 mg by mouth.    . pantoprazole (PROTONIX) 40 MG tablet Take 1 tablet (40 mg total) by mouth daily. 90 tablet 1  . therapeutic multivitamin-minerals (THERAGRAN-M) tablet Take 1 tablet by mouth daily.     No current facility-administered medications on file prior to visit.   Allergies  Allergen Reactions  . Tape     adhesives   Social History   Socioeconomic History  . Marital status: Married    Spouse name: Not on file  . Number of children: Not on file  . Years of education: Not on file  . Highest education level: Not on file  Occupational History  . Occupation: retired  Tobacco Use   . Smoking status: Never Smoker  . Smokeless tobacco: Never Used  Vaping Use  . Vaping Use: Never used  Substance and Sexual Activity  . Alcohol use: Yes    Comment: rare  . Drug use: No  . Sexual activity: Never    Partners: Male    Birth  control/protection: Surgical    Comment: hyst  Other Topics Concern  . Not on file  Social History Narrative  . Not on file   Social Determinants of Health   Financial Resource Strain:   . Difficulty of Paying Living Expenses: Not on file  Food Insecurity:   . Worried About Programme researcher, broadcasting/film/videounning Out of Food in the Last Year: Not on file  . Ran Out of Food in the Last Year: Not on file  Transportation Needs:   . Lack of Transportation (Medical): Not on file  . Lack of Transportation (Non-Medical): Not on file  Physical Activity:   . Days of Exercise per Week: Not on file  . Minutes of Exercise per Session: Not on file  Stress:   . Feeling of Stress : Not on file  Social Connections:   . Frequency of Communication with Friends and Family: Not on file  . Frequency of Social Gatherings with Friends and Family: Not on file  . Attends Religious Services: Not on file  . Active Member of Clubs or Organizations: Not on file  . Attends BankerClub or Organization Meetings: Not on file  . Marital Status: Not on file  Intimate Partner Violence:   . Fear of Current or Ex-Partner: Not on file  . Emotionally Abused: Not on file  . Physically Abused: Not on file  . Sexually Abused: Not on file     Review of Systems  All other systems reviewed and are negative.      Objective:   Physical Exam Vitals reviewed.  Constitutional:      Appearance: Normal appearance.  Cardiovascular:     Rate and Rhythm: Normal rate and regular rhythm.     Heart sounds: Normal heart sounds. No murmur heard.  No friction rub. No gallop.   Pulmonary:     Effort: Pulmonary effort is normal. No respiratory distress.     Breath sounds: Normal breath sounds. No wheezing, rhonchi or  rales.  Abdominal:     General: Bowel sounds are normal. There is no distension.     Palpations: Abdomen is soft.     Tenderness: There is no abdominal tenderness.  Musculoskeletal:     Right lower leg: No edema.     Left lower leg: No edema.  Skin:    General: Skin is warm.     Coloration: Skin is not jaundiced.     Findings: No rash.  Neurological:     General: No focal deficit present.     Mental Status: She is alert and oriented to person, place, and time.     Cranial Nerves: No cranial nerve deficit.     Sensory: No sensory deficit.     Motor: No weakness.     Coordination: Coordination normal.     Gait: Gait normal.     Lab on 09/25/2020  Component Date Value Ref Range Status  . WBC 09/25/2020 8.0  3.8 - 10.8 Thousand/uL Final  . RBC 09/25/2020 4.35  3.80 - 5.10 Million/uL Final  . Hemoglobin 09/25/2020 13.8  11.7 - 15.5 g/dL Final  . HCT 40/98/119111/02/2020 41.4  35 - 45 % Final  . MCV 09/25/2020 95.2  80.0 - 100.0 fL Final  . MCH 09/25/2020 31.7  27.0 - 33.0 pg Final  . MCHC 09/25/2020 33.3  32.0 - 36.0 g/dL Final  . RDW 47/82/956211/02/2020 12.5  11.0 - 15.0 % Final  . Platelets 09/25/2020 307  140 - 400 Thousand/uL Final  . MPV 09/25/2020 10.1  7.5 - 12.5 fL Final  . Neutro Abs 09/25/2020 4,744  1,500 - 7,800 cells/uL Final  . Lymphs Abs 09/25/2020 2,440  850 - 3,900 cells/uL Final  . Absolute Monocytes 09/25/2020 576  200 - 950 cells/uL Final  . Eosinophils Absolute 09/25/2020 192  15.0 - 500.0 cells/uL Final  . Basophils Absolute 09/25/2020 48  0.0 - 200.0 cells/uL Final  . Neutrophils Relative % 09/25/2020 59.3  % Final  . Total Lymphocyte 09/25/2020 30.5  % Final  . Monocytes Relative 09/25/2020 7.2  % Final  . Eosinophils Relative 09/25/2020 2.4  % Final  . Basophils Relative 09/25/2020 0.6  % Final  . Cholesterol 09/25/2020 199  <200 mg/dL Final  . HDL 41/96/2229 55  > OR = 50 mg/dL Final  . Triglycerides 09/25/2020 124  <150 mg/dL Final  . LDL Cholesterol (Calc)  09/25/2020 121* mg/dL (calc) Final   Comment: Reference range: <100 . Desirable range <100 mg/dL for primary prevention;   <70 mg/dL for patients with CHD or diabetic patients  with > or = 2 CHD risk factors. Marland Kitchen LDL-C is now calculated using the Martin-Hopkins  calculation, which is a validated novel method providing  better accuracy than the Friedewald equation in the  estimation of LDL-C.  Horald Pollen et al. Lenox Ahr. 7989;211(94): 2061-2068  (http://education.[facility].com/faq/FAQ164)   . Total CHOL/HDL Ratio 09/25/2020 3.6  <1.7 (calc) Final  . Non-HDL Cholesterol (Calc) 09/25/2020 144* <130 mg/dL (calc) Final   Comment: For patients with diabetes plus 1 major ASCVD risk  factor, treating to a non-HDL-C goal of <100 mg/dL  (LDL-C of <40 mg/dL) is considered a therapeutic  option.   . Glucose, Bld 09/25/2020 94  65 - 99 mg/dL Final   Comment: .            Fasting reference interval .   . BUN 09/25/2020 17  7 - 25 mg/dL Final  . Creat 81/44/8185 0.77  0.60 - 0.93 mg/dL Final   Comment: For patients >38 years of age, the reference limit for Creatinine is approximately 13% higher for people identified as African-American. .   . GFR, Est Non African American 09/25/2020 77  > OR = 60 mL/min/1.39m2 Final  . GFR, Est African American 09/25/2020 89  > OR = 60 mL/min/1.43m2 Final  . BUN/Creatinine Ratio 09/25/2020 NOT APPLICABLE  6 - 22 (calc) Final  . Sodium 09/25/2020 142  135 - 146 mmol/L Final  . Potassium 09/25/2020 4.3  3.5 - 5.3 mmol/L Final  . Chloride 09/25/2020 107  98 - 110 mmol/L Final  . CO2 09/25/2020 25  20 - 32 mmol/L Final  . Calcium 09/25/2020 9.3  8.6 - 10.4 mg/dL Final  . Total Protein 09/25/2020 6.0* 6.1 - 8.1 g/dL Final  . Albumin 63/14/9702 4.2  3.6 - 5.1 g/dL Final  . Globulin 63/78/5885 1.8* 1.9 - 3.7 g/dL (calc) Final  . AG Ratio 09/25/2020 2.3  1.0 - 2.5 (calc) Final  . Total Bilirubin 09/25/2020 1.5* 0.2 - 1.2 mg/dL Final  . Alkaline phosphatase  (APISO) 09/25/2020 50  37 - 153 U/L Final  . AST 09/25/2020 12  10 - 35 U/L Final  . ALT 09/25/2020 12  6 - 29 U/L Final  . TSH 09/25/2020 3.55  0.40 - 4.50 mIU/L Final         Assessment & Plan:  Hypothyroidism, unspecified type  Osteoporosis without current pathological fracture, unspecified osteoporosis type - Plan: DG Bone Density  Thyroid test is acceptable.  Repeat  bone density.  Encourage the patient to take 1200 mg a day of calcium in addition to her vitamin D.  Discontinue Reclast after 5 years.  Likely recheck a bone density test in 2 years to compare it to 2 the baseline we obtain this year.  Mammogram and Cologuard are up-to-date.  Covid vaccine is up-to-date.

## 2020-10-25 ENCOUNTER — Other Ambulatory Visit: Payer: Self-pay | Admitting: Family Medicine

## 2020-10-25 DIAGNOSIS — E039 Hypothyroidism, unspecified: Secondary | ICD-10-CM

## 2020-11-25 DIAGNOSIS — Z124 Encounter for screening for malignant neoplasm of cervix: Secondary | ICD-10-CM | POA: Diagnosis not present

## 2020-11-25 DIAGNOSIS — B372 Candidiasis of skin and nail: Secondary | ICD-10-CM | POA: Diagnosis not present

## 2020-11-25 DIAGNOSIS — Z1272 Encounter for screening for malignant neoplasm of vagina: Secondary | ICD-10-CM | POA: Diagnosis not present

## 2020-11-25 DIAGNOSIS — Z6827 Body mass index (BMI) 27.0-27.9, adult: Secondary | ICD-10-CM | POA: Diagnosis not present

## 2020-12-18 ENCOUNTER — Other Ambulatory Visit: Payer: Self-pay | Admitting: *Deleted

## 2020-12-18 DIAGNOSIS — H5203 Hypermetropia, bilateral: Secondary | ICD-10-CM | POA: Diagnosis not present

## 2020-12-18 DIAGNOSIS — K219 Gastro-esophageal reflux disease without esophagitis: Secondary | ICD-10-CM

## 2020-12-18 DIAGNOSIS — H43393 Other vitreous opacities, bilateral: Secondary | ICD-10-CM | POA: Diagnosis not present

## 2020-12-18 DIAGNOSIS — H0288B Meibomian gland dysfunction left eye, upper and lower eyelids: Secondary | ICD-10-CM | POA: Diagnosis not present

## 2020-12-18 DIAGNOSIS — H04123 Dry eye syndrome of bilateral lacrimal glands: Secondary | ICD-10-CM | POA: Diagnosis not present

## 2020-12-18 DIAGNOSIS — H524 Presbyopia: Secondary | ICD-10-CM | POA: Diagnosis not present

## 2020-12-18 DIAGNOSIS — H2513 Age-related nuclear cataract, bilateral: Secondary | ICD-10-CM | POA: Diagnosis not present

## 2020-12-18 DIAGNOSIS — H0288A Meibomian gland dysfunction right eye, upper and lower eyelids: Secondary | ICD-10-CM | POA: Diagnosis not present

## 2020-12-18 DIAGNOSIS — H35372 Puckering of macula, left eye: Secondary | ICD-10-CM | POA: Diagnosis not present

## 2020-12-18 DIAGNOSIS — H0012 Chalazion right lower eyelid: Secondary | ICD-10-CM | POA: Diagnosis not present

## 2020-12-18 MED ORDER — PANTOPRAZOLE SODIUM 40 MG PO TBEC: 40.0000 mg | DELAYED_RELEASE_TABLET | Freq: Every day | ORAL | 1 refills | Status: DC

## 2021-01-12 ENCOUNTER — Other Ambulatory Visit: Payer: Self-pay | Admitting: Family Medicine

## 2021-01-12 DIAGNOSIS — M810 Age-related osteoporosis without current pathological fracture: Secondary | ICD-10-CM

## 2021-01-19 NOTE — Progress Notes (Signed)
[facility] Note  01/21/2021     CHIEF COMPLAINT Patient presents for Retina Follow Up   HISTORY OF PRESENT ILLNESS: Gina Gibbs is a 75 y.o. female who presents to the clinic today for:   HPI    Retina Follow Up    Patient presents with  Retinal Break/Detachment.  In left eye.  This started 2 years ago.  Severity is mild.  Duration of 1 year.  Since onset it is stable.  I, the attending physician,  performed the HPI with the patient and updated documentation appropriately.          Comments    75 y/o female pt here for 1 yr f/u for ret tear OS.  No change in [place] OU.  Denies pain, FOL, floaters.  C/o mild glare and photophobia; more noticeable at night.  Systane BID OU.       Last edited by Bernarda Caffey, MD on 01/21/2021  1:07 PM. (History)    pt states she saw Dr. Katy Fitch last month and got new glasses, she states vision is doing well, she is not noticing distortion in either eye   Referring physician: Warden Fillers, [address] [place],  [place] [address]  HISTORICAL INFORMATION:   Selected notes from the MEDICAL RECORD NUMBER Referred by Dr. Madelin Headings for retinal hole OD LEE: 02.06.20 Jeb Levering) [BCVA: 20/20 OU] Ocular Hx-cellophane OD, ERM OD, PVD OD, retinal hole OD PMH-arthritis    CURRENT MEDICATIONS: No current outpatient medications on file. (Ophthalmic Drugs)   No current facility-administered medications for this visit. (Ophthalmic Drugs)   Current Outpatient Medications (Other)  Medication Sig  . Cholecalciferol 125 MCG (5000 UT) TABS Vitamin D3 125 mcg (5,000 unit) tablet   1 tablet every day by oral route.  . fluticasone (FLONASE) 50 MCG/ACT nasal spray Place 2 sprays into both nostrils daily.  Marland Kitchen HYDROcodone-homatropine (HYCODAN) 5-1.5 MG/5ML syrup hydrocodone-homatropine 5 mg-1.5 mg/5 mL oral syrup  . levothyroxine (SYNTHROID) 25 MCG tablet TAKE ONE (1) TABLET BY MOUTH EVERY DAY  . meloxicam (MOBIC) 15  MG tablet TAKE ONE TABLET (15 MG TOTAL) BY MOUTH DAILY.  Marland Kitchen nystatin cream (MYCOSTATIN) APPLY TO THE AFFECTED AREA(S) TWO TIMES A DAY  . Omega-3 Fatty Acids (FISH OIL) 1000 MG CAPS Take 1,000 mg by mouth.  . pantoprazole (PROTONIX) 40 MG tablet Take 1 tablet (40 mg total) by mouth daily.  Marland Kitchen therapeutic multivitamin-minerals (THERAGRAN-M) tablet Take 1 tablet by mouth daily.   No current facility-administered medications for this visit. (Other)      REVIEW OF SYSTEMS: ROS    Positive for: Gastrointestinal, Musculoskeletal, Eyes   Negative for: Constitutional, Neurological, Skin, Genitourinary, HENT, Endocrine, Cardiovascular, Respiratory, Psychiatric, Allergic/Imm, Heme/Lymph   Last edited by Matthew Folks, COA on 01/21/2021  9:35 AM. (History)       ALLERGIES Allergies  Allergen Reactions  . Tape     adhesives    PAST MEDICAL HISTORY Past Medical History:  Diagnosis Date  . Arthritis   . Cataract    Mixed forms OU  . Complication of anesthesia   . GERD (gastroesophageal reflux disease) 02/28/2013  . Hyperlipidemia 02/28/2013  . Hypothyroid   . Hypothyroid 09/26/2013  . Hypothyroidism 03/31/2020  . Macular degeneration    non-exu OU  . Osteoporosis   . PONV (postoperative nausea and vomiting)    Past Surgical History:  Procedure Laterality Date  . ABDOMINAL HYSTERECTOMY    .  EAR CYST EXCISION  02/23/2012   Procedure: CYST REMOVAL;  Surgeon: Wynonia Sours, MD;  Location: [facility];  Service: Orthopedics;  Laterality: Left;  debridement distal interphalangeal left index finger, excision cyst/loose body  . FRACTURE SURGERY  09/2013   right ankle  . spinal cyst removal      FAMILY HISTORY Family History  Problem Relation Age of Onset  . Diabetes Mother     SOCIAL HISTORY Social History   Tobacco Use  . Smoking status: Never Smoker  . Smokeless tobacco: Never Used  Vaping Use  . Vaping Use: Never used  Substance Use Topics  . Alcohol use: Yes     Comment: rare  . Drug use: No         OPHTHALMIC EXAM:  Base Eye Exam    Visual Acuity (Snellen - Linear)      Right Left   Dist cc 20/20 - 20/20 +   Correction: Glasses       Tonometry (Tonopen, 9:39 AM)      Right Left   Pressure 12 13       Pupils      Dark Light Shape React APD   Right 3 2 Round Brisk None   Left 3 2 Round Brisk None       Visual Fields (Counting fingers)      Left Right    Full Full       Extraocular Movement      Right Left    Full, Ortho Full, Ortho       Neuro/Psych    Oriented x3: Yes   Mood/Affect: Normal       Dilation    Both eyes: 1.0% Mydriacyl, 2.5% Phenylephrine @ 9:39 AM        Slit Lamp and Fundus Exam    Slit Lamp Exam      Right Left   Lids/Lashes Dermatochalasis - upper lid, mild Meibomian gland dysfunction Dermatochalasis - upper lid, mild Meibomian gland dysfunction   Conjunctiva/Sclera White and quiet White and quiet   Cornea 1-2+Punctate epithelial erosions 1+ inferior Punctate epithelial erosions   Anterior Chamber deep, narrow temporal angle deep, narrow temporal angle   Iris Round and dilated Round and dilated   Lens 2+ Nuclear sclerosis, 3+ Cortical cataract 2-3+ Nuclear sclerosis, 3+ Cortical cataract   Vitreous Vitreous syneresis, Posterior vitreous detachment Vitreous syneresis, Posterior vitreous detachment       Fundus Exam      Right Left   Disc Pink and Sharp Pink and Sharp, mild temporal Peripapillary atrophy, Compact, Tilted disc   C/D Ratio 0.3 0.2   Macula Flat, Blunted foveal reflex, scattered Drusen, RPE mottling and clumping, No heme or edema, stable from prior Flat, blunted foveal reflex, trace Epiretinal membrane superiorly, mild Retinal pigment epithelial mottling, Drusen greatest superior macula   Vessels mild attenuation, mild tortuousity mild Vascular attenuation, mild Tortuousity   Periphery Attached, blonde peripheral fundus, pigmented CR scar at 0330, inferior cobblestoning at  0600 with CR scars, operculated hole with surrounding laser scars at 1030 equator, scattered reticular degeneration, no new RT/RD Attached, blonde peripheral fundus, HST at 0230 with pigmented demarcation line - excellent laser surrounding, no SRF, pigmented peripheral cystoid degeneration, no new RT/RD          IMAGING AND PROCEDURES  Imaging and Procedures for _0 @  OCT, Retina - OU - Both Eyes       Right Eye Quality was good. Central Foveal Thickness: 309.  Progression has been stable. Findings include normal foveal contour, no IRF, no SRF, epiretinal membrane, retinal drusen  (Focal ERM SN macula - stable from prior).   Left Eye Quality was good. Central Foveal Thickness: 304. Progression has worsened. Findings include normal foveal contour, no IRF, no SRF, epiretinal membrane, macular pucker, retinal drusen  (ERM superior macula, mild progression of focal pucker ST).   Notes *Images captured and stored on drive  Diagnosis / Impression:  NFP; no IRF/SRF OU ERM OU (OS > OD) Retinal drusen OU OS: ERM superior macula, mild progression of focal pucker ST  Clinical management:  See below  Abbreviations: NFP - Normal foveal profile. CME - cystoid macular edema. PED - pigment epithelial detachment. IRF - intraretinal fluid. SRF - subretinal fluid. EZ - ellipsoid zone. ERM - epiretinal membrane. ORA - outer retinal atrophy. ORT - outer retinal tubulation. SRHM - subretinal hyper-reflective material                 ASSESSMENT/PLAN:    ICD-10-CM   1. Retinal tear of left eye  H33.312   2. History of repair of retinal tear by laser photocoagulation  Z98.890   3. Epiretinal membrane (ERM) of both eyes  H35.373   4. Retinal edema  H35.81 OCT, Retina - OU - Both Eyes  5. Early dry stage nonexudative age-related macular degeneration of both eyes  H35.3131   6. Combined forms of age-related cataract of both eyes  H25.813   7. Diplopia  H53.2    1. Retinal tear, OS     -  tear located at 0230 with pigmented demarcation line, but no SRF  - s/p laser retinopexy (02.12.20) -- excellent laser in place  - stable  - f/u in 1 year, sooner prn  2. History of retinal tear OD  - operculated hole at 1030  - s/p laser retinopexy w/ Dr. Sherlynn Stalls 2013  - stable  3,4. Epiretinal membrane, OU (OS>OD)  - OS with mild progression of focal macular pucker superonasal macula -- BCVA 20/20 OU  - mild ERM OU  - asymptomatic, no metamorphopsia  - no indication for surgery at this time  - monitor  - f/u 1 year, sooner prn -- DFE/OCT  5. Age related macular degeneration, non-exudative, both eyes  - early stage; mild drusen  - The incidence, anatomy, and pathology of dry AMD, risk of progression, and the AREDS and AREDS 2 study including smoking risks discussed with patient.  - recommend amsler grid monitoring  6. Mixed form age related cataract OU  - The symptoms of cataract, surgical options, and treatments and risks were discussed with patient.  - discussed diagnosis and progression  - ?progression and early visual significance OS causing new symptoms OS (see below)  - under the expert management of Dr. Shirleen Schirmer  7. Intermittent monocular diplopia and blurred vision OS -- resolved  - pt reported intermittent monocular diplopia OS -- images sometimes skewed, sometimes vertical  - during visual acuity testing OS, improved with pinhole  - during EOM testing -- nonpresent in primary gaze and elicited in extreme gazes  - suspect may be related to mild progression of cataract OS  - retina stable  - under the expert management of Dr. Orbie Pyo improved with aggressive treatment of dry eyes   Ophthalmic Meds Ordered this visit:  No orders of the defined types were placed in this encounter.      Return in about 1 year (around 01/21/2022) for f/u  ERM OU / non-exu ARMD OU, DFE, OCT.  There are no Patient Instructions on file for this visit.   Explained the  diagnoses, plan, and follow up with the patient and they expressed understanding.  Patient expressed understanding of the importance of proper follow up care.   This document serves as a record of services personally performed by Gardiner Sleeper, MD, PhD. It was created on their behalf by Roselee Nova, COMT. The creation of this record is the provider's dictation and/or activities during the visit.  Electronically signed by: Roselee Nova, COMT 01/21/21 1:10 PM   This document serves as a record of services personally performed by Gardiner Sleeper, MD, PhD. It was created on their behalf by San Jetty. Owens Shark, OA an ophthalmic technician. The creation of this record is the provider's dictation and/or activities during the visit.    Electronically signed by: San Jetty. Owens Shark, [place] 03.02.2022 1:10 PM  Gardiner Sleeper, M.D., Ph.D. Diseases & Surgery of the Retina and [address]  I have reviewed the above documentation for accuracy and completeness, and I agree with the above. Gardiner Sleeper, M.D., Ph.D. 01/21/21 1:10 PM  Abbreviations: M myopia (nearsighted); A astigmatism; H hyperopia (farsighted); P presbyopia; Mrx spectacle prescription;  CTL contact lenses; OD right eye; OS left eye; OU both eyes  XT exotropia; ET esotropia; PEK punctate epithelial keratitis; PEE punctate epithelial erosions; DES dry eye syndrome; MGD meibomian gland dysfunction; ATs artificial tears; PFAT's preservative free artificial tears; [place] nuclear sclerotic cataract; PSC posterior subcapsular cataract; ERM epi-retinal membrane; PVD posterior vitreous detachment; RD retinal detachment; DM diabetes mellitus; DR diabetic retinopathy; NPDR non-proliferative diabetic retinopathy; PDR proliferative diabetic retinopathy; CSME clinically significant macular edema; DME diabetic macular edema; dbh dot blot hemorrhages; CWS cotton wool spot; POAG primary open angle glaucoma; C/D cup-to-disc ratio; HVF  humphrey visual field; GVF goldmann visual field; OCT optical coherence tomography; IOP intraocular pressure; BRVO Branch retinal vein occlusion; CRVO central retinal vein occlusion; CRAO central retinal artery occlusion; BRAO branch retinal artery occlusion; RT retinal tear; SB scleral buckle; PPV pars plana vitrectomy; VH Vitreous hemorrhage; PRP panretinal laser photocoagulation; IVK intravitreal kenalog; VMT vitreomacular traction; MH Macular hole;  NVD neovascularization of the disc; NVE neovascularization elsewhere; AREDS age related eye disease study; ARMD age related macular degeneration; POAG primary open angle glaucoma; EBMD epithelial/anterior basement membrane dystrophy; ACIOL anterior chamber intraocular lens; IOL intraocular lens; PCIOL posterior chamber intraocular lens; Phaco/IOL phacoemulsification with intraocular lens placement; Bent photorefractive keratectomy; LASIK laser assisted in situ keratomileusis; HTN hypertension; DM diabetes mellitus; COPD chronic obstructive pulmonary disease

## 2021-01-21 ENCOUNTER — Ambulatory Visit (INDEPENDENT_AMBULATORY_CARE_PROVIDER_SITE_OTHER): Payer: Medicare PPO | Admitting: Ophthalmology

## 2021-01-21 ENCOUNTER — Encounter (INDEPENDENT_AMBULATORY_CARE_PROVIDER_SITE_OTHER): Payer: Self-pay | Admitting: Ophthalmology

## 2021-01-21 ENCOUNTER — Other Ambulatory Visit: Payer: Self-pay

## 2021-01-21 DIAGNOSIS — H532 Diplopia: Secondary | ICD-10-CM | POA: Diagnosis not present

## 2021-01-21 DIAGNOSIS — Z9889 Other specified postprocedural states: Secondary | ICD-10-CM | POA: Diagnosis not present

## 2021-01-21 DIAGNOSIS — H3581 Retinal edema: Secondary | ICD-10-CM | POA: Diagnosis not present

## 2021-01-21 DIAGNOSIS — H33312 Horseshoe tear of retina without detachment, left eye: Secondary | ICD-10-CM

## 2021-01-21 DIAGNOSIS — H35373 Puckering of macula, bilateral: Secondary | ICD-10-CM | POA: Diagnosis not present

## 2021-01-21 DIAGNOSIS — H25813 Combined forms of age-related cataract, bilateral: Secondary | ICD-10-CM | POA: Diagnosis not present

## 2021-01-21 DIAGNOSIS — H353131 Nonexudative age-related macular degeneration, bilateral, early dry stage: Secondary | ICD-10-CM | POA: Diagnosis not present

## 2021-01-22 DIAGNOSIS — Z4689 Encounter for fitting and adjustment of other specified devices: Secondary | ICD-10-CM | POA: Diagnosis not present

## 2021-01-24 DIAGNOSIS — K219 Gastro-esophageal reflux disease without esophagitis: Secondary | ICD-10-CM | POA: Diagnosis not present

## 2021-01-24 DIAGNOSIS — E039 Hypothyroidism, unspecified: Secondary | ICD-10-CM | POA: Diagnosis not present

## 2021-01-24 DIAGNOSIS — R682 Dry mouth, unspecified: Secondary | ICD-10-CM | POA: Diagnosis not present

## 2021-01-24 DIAGNOSIS — M1990 Unspecified osteoarthritis, unspecified site: Secondary | ICD-10-CM | POA: Diagnosis not present

## 2021-01-24 DIAGNOSIS — M810 Age-related osteoporosis without current pathological fracture: Secondary | ICD-10-CM | POA: Diagnosis not present

## 2021-01-24 DIAGNOSIS — I10 Essential (primary) hypertension: Secondary | ICD-10-CM | POA: Diagnosis not present

## 2021-01-24 DIAGNOSIS — H409 Unspecified glaucoma: Secondary | ICD-10-CM | POA: Diagnosis not present

## 2021-01-24 DIAGNOSIS — J302 Other seasonal allergic rhinitis: Secondary | ICD-10-CM | POA: Diagnosis not present

## 2021-01-24 DIAGNOSIS — Z791 Long term (current) use of non-steroidal anti-inflammatories (NSAID): Secondary | ICD-10-CM | POA: Diagnosis not present

## 2021-03-12 DIAGNOSIS — M19012 Primary osteoarthritis, left shoulder: Secondary | ICD-10-CM | POA: Diagnosis not present

## 2021-03-24 ENCOUNTER — Ambulatory Visit (INDEPENDENT_AMBULATORY_CARE_PROVIDER_SITE_OTHER): Payer: Medicare PPO | Admitting: Family Medicine

## 2021-03-24 ENCOUNTER — Encounter: Payer: Self-pay | Admitting: Family Medicine

## 2021-03-24 ENCOUNTER — Other Ambulatory Visit: Payer: Self-pay

## 2021-03-24 VITALS — BP 128/64 | HR 76 | Temp 97.9°F | Resp 14 | Ht 62.0 in | Wt 148.0 lb

## 2021-03-24 DIAGNOSIS — J309 Allergic rhinitis, unspecified: Secondary | ICD-10-CM | POA: Diagnosis not present

## 2021-03-24 MED ORDER — PREDNISONE 20 MG PO TABS
ORAL_TABLET | ORAL | 0 refills | Status: DC
Start: 2021-03-24 — End: 2021-04-03

## 2021-03-24 MED ORDER — HYDROCODONE BIT-HOMATROP MBR 5-1.5 MG/5ML PO SOLN
5.0000 mL | Freq: Three times a day (TID) | ORAL | 0 refills | Status: DC | PRN
Start: 2021-03-24 — End: 2021-04-07

## 2021-03-24 MED ORDER — AMOXICILLIN 875 MG PO TABS
875.0000 mg | ORAL_TABLET | Freq: Two times a day (BID) | ORAL | 0 refills | Status: AC
Start: 2021-03-24 — End: 2021-04-03

## 2021-03-24 NOTE — Progress Notes (Signed)
Subjective:    Patient ID: Gina Gibbs, female    DOB: Sep 28, 1946, 75 y.o.   MRN: [id number]  HPI  Patient has been battling allergies.  She reports runny nose and itchy watery eyes.  However over the last week and a half, the cough has become unbearable.  She denies any shortness of breath.  She denies any chest pain.  The cough is productive of clear or white mucus.  She also reports pain and pressure in her sinuses.  They are only slightly tender.  She denies any sore throat or otalgia.  She denies any hemoptysis or hematemesis.  She denies any pleurisy Past Medical History:  Diagnosis Date  . Arthritis   . Cataract    Mixed forms OU  . Complication of anesthesia   . GERD (gastroesophageal reflux disease) 02/28/2013  . Hyperlipidemia 02/28/2013  . Hypothyroid   . Hypothyroid 09/26/2013  . Hypothyroidism 03/31/2020  . Macular degeneration    non-exu OU  . Osteoporosis   . PONV (postoperative nausea and vomiting)    Past Surgical History:  Procedure Laterality Date  . ABDOMINAL HYSTERECTOMY    . EAR CYST EXCISION  02/23/2012   Procedure: CYST REMOVAL;  Surgeon: Nicki Reaper, MD;  Location: [facility];  Service: Orthopedics;  Laterality: Left;  debridement distal interphalangeal left index finger, excision cyst/loose body  . FRACTURE SURGERY  09/2013   right ankle  . spinal cyst removal     Current Outpatient Medications on File Prior to Visit  Medication Sig Dispense Refill  . Cholecalciferol 125 MCG (5000 UT) TABS Vitamin D3 125 mcg (5,000 unit) tablet   1 tablet every day by oral route.    Marland Kitchen levothyroxine (SYNTHROID) 25 MCG tablet TAKE ONE (1) TABLET BY MOUTH EVERY DAY 90 tablet 1  . meloxicam (MOBIC) 15 MG tablet TAKE ONE TABLET (15 MG TOTAL) BY MOUTH DAILY. 90 tablet 1  . nystatin cream (MYCOSTATIN) APPLY TO THE AFFECTED AREA(S) TWO TIMES A DAY    . Omega-3 Fatty Acids (FISH OIL) 1000 MG CAPS Take 1,000 mg by mouth.    . pantoprazole (PROTONIX) 40 MG  tablet Take 1 tablet (40 mg total) by mouth daily. 90 tablet 1  . therapeutic multivitamin-minerals (THERAGRAN-M) tablet Take 1 tablet by mouth daily.     No current facility-administered medications on file prior to visit.   Allergies  Allergen Reactions  . Tape     adhesives   Social History   Socioeconomic History  . Marital status: Married    Spouse name: Not on file  . Number of children: Not on file  . Years of education: Not on file  . Highest education level: Not on file  Occupational History  . Occupation: retired  Tobacco Use  . Smoking status: Never Smoker  . Smokeless tobacco: Never Used  Vaping Use  . Vaping Use: Never used  Substance and Sexual Activity  . Alcohol use: Yes    Comment: rare  . Drug use: No  . Sexual activity: Never    Partners: Male    Birth control/protection: Surgical    Comment: hyst  Other Topics Concern  . Not on file  Social History Narrative  . Not on file   Social Determinants of Health   Financial Resource Strain: Not on file  Food Insecurity: Not on file  Transportation Needs: Not on file  Physical Activity: Not on file  Stress: Not on file  Social Connections: Not on  file  Intimate Partner Violence: Not on file     Review of Systems  All other systems reviewed and are negative.      Objective:   Physical Exam Vitals reviewed.  Constitutional:      Appearance: Normal appearance.  HENT:     Right Ear: Tympanic membrane and ear canal normal.     Left Ear: Tympanic membrane and ear canal normal.     Nose: Congestion and rhinorrhea present.  Cardiovascular:     Rate and Rhythm: Normal rate and regular rhythm.     Heart sounds: Normal heart sounds. No murmur heard. No friction rub. No gallop.   Pulmonary:     Effort: Pulmonary effort is normal. No respiratory distress.     Breath sounds: Normal breath sounds. No wheezing, rhonchi or rales.  Abdominal:     General: Bowel sounds are normal. There is no  distension.     Palpations: Abdomen is soft.     Tenderness: There is no abdominal tenderness.  Musculoskeletal:     Right lower leg: No edema.     Left lower leg: No edema.  Skin:    General: Skin is warm.     Coloration: Skin is not jaundiced.     Findings: No rash.  Neurological:     General: No focal deficit present.     Mental Status: She is alert and oriented to person, place, and time.     Cranial Nerves: No cranial nerve deficit.     Sensory: No sensory deficit.     Motor: No weakness.     Coordination: Coordination normal.     Gait: Gait normal.            Assessment & Plan:  Allergic sinusitis  I believe the patient is dealing with allergic sinusitis.  Begin prednisone taper pack in addition to her Zyrtec that she is already taking.  Once better, add the Flonase to maintain until the end of allergy season.  If she develops fever or severe pain in her sinuses, then she can start taking amoxicillin for a secondary sinus infection but right now I feel she is dealing mainly with allergies

## 2021-04-03 ENCOUNTER — Ambulatory Visit
Admission: RE | Admit: 2021-04-03 | Discharge: 2021-04-03 | Disposition: A | Discharge status: Home / Self Care | Payer: Medicare PPO | Source: Ambulatory Visit | Attending: Family Medicine | Admitting: Family Medicine

## 2021-04-03 ENCOUNTER — Ambulatory Visit (INDEPENDENT_AMBULATORY_CARE_PROVIDER_SITE_OTHER): Payer: Medicare PPO | Admitting: Family Medicine

## 2021-04-03 ENCOUNTER — Encounter: Payer: Self-pay | Admitting: Family Medicine

## 2021-04-03 ENCOUNTER — Other Ambulatory Visit: Payer: Self-pay

## 2021-04-03 VITALS — BP 122/72 | HR 100 | Temp 98.1°F | Resp 16 | Ht 62.0 in | Wt 150.0 lb

## 2021-04-03 DIAGNOSIS — J40 Bronchitis, not specified as acute or chronic: Secondary | ICD-10-CM

## 2021-04-03 DIAGNOSIS — R0602 Shortness of breath: Secondary | ICD-10-CM | POA: Diagnosis not present

## 2021-04-03 DIAGNOSIS — R059 Cough, unspecified: Secondary | ICD-10-CM | POA: Diagnosis not present

## 2021-04-03 MED ORDER — ALBUTEROL SULFATE HFA 108 (90 BASE) MCG/ACT IN AERS: 2.0000 | INHALATION_SPRAY | Freq: Four times a day (QID) | RESPIRATORY_TRACT | 0 refills | Status: AC | PRN

## 2021-04-03 MED ORDER — MONTELUKAST SODIUM 10 MG PO TABS: 10.0000 mg | ORAL_TABLET | Freq: Every day | ORAL | 3 refills | Status: DC

## 2021-04-03 MED ORDER — PREDNISONE 20 MG PO TABS
40.0000 mg | ORAL_TABLET | Freq: Every day | ORAL | 0 refills | Status: DC
Start: 2021-04-03 — End: 2021-10-05

## 2021-04-03 NOTE — Progress Notes (Signed)
Subjective:    Patient ID: Gina Gibbs, female    DOB: 09-18-1946, 75 y.o.   MRN: [id number]  HPI 03/24/21 Patient has been battling allergies.  She reports runny nose and itchy watery eyes.  However over the last week and a half, the cough has become unbearable.  She denies any shortness of breath.  She denies any chest pain.  The cough is productive of clear or white mucus.  She also reports pain and pressure in her sinuses.  They are only slightly tender.  She denies any sore throat or otalgia.  She denies any hemoptysis or hematemesis.  She denies any pleurisy.  AT that time, my plan was: I believe the patient is dealing with allergic sinusitis.  Begin prednisone taper pack in addition to her Zyrtec that she is already taking.  Once better, add the Flonase to maintain until the end of allergy season.  If she develops fever or severe pain in her sinuses, then she can start taking amoxicillin for a secondary sinus infection but right now I feel she is dealing mainly with allergies  04/03/21 Patient states that she cannot stop coughing.  She states that she felt better when she was taking the prednisone and however as soon as the prednisone stopped, the pressure returned, the coughing worsened, and she developed hoarseness.  She states that she has coughing fits every 1-2 hours at night to keep her awake.  She also reports hearing wheezing at night.  She is still taking Zyrtec and Flonase however she continues to have coughing and wheezing.  Cough is also productive of green sputum.  She denies any fevers or chills.  She denies any sinus pain.  She denies any pleurisy or hemoptysis. Past Medical History:  Diagnosis Date  . Arthritis   . Cataract    Mixed forms OU  . Complication of anesthesia   . GERD (gastroesophageal reflux disease) 02/28/2013  . Hyperlipidemia 02/28/2013  . Hypothyroid   . Hypothyroid 09/26/2013  . Hypothyroidism 03/31/2020  . Macular degeneration    non-exu OU  .  Osteoporosis   . PONV (postoperative nausea and vomiting)    Past Surgical History:  Procedure Laterality Date  . ABDOMINAL HYSTERECTOMY    . EAR CYST EXCISION  02/23/2012   Procedure: CYST REMOVAL;  Surgeon: Nicki Reaper, MD;  Location: [facility];  Service: Orthopedics;  Laterality: Left;  debridement distal interphalangeal left index finger, excision cyst/loose body  . FRACTURE SURGERY  09/2013   right ankle  . spinal cyst removal     Current Outpatient Medications on File Prior to Visit  Medication Sig Dispense Refill  . amoxicillin (AMOXIL) 875 MG tablet Take 1 tablet (875 mg total) by mouth 2 (two) times daily for 10 days. 20 tablet 0  . Cholecalciferol 125 MCG (5000 UT) TABS Vitamin D3 125 mcg (5,000 unit) tablet   1 tablet every day by oral route.    Marland Kitchen HYDROcodone bit-homatropine (HYCODAN) 5-1.5 MG/5ML syrup Take 5 mLs by mouth every 8 (eight) hours as needed for cough. 120 mL 0  . levothyroxine (SYNTHROID) 25 MCG tablet TAKE ONE (1) TABLET BY MOUTH EVERY DAY 90 tablet 1  . meloxicam (MOBIC) 15 MG tablet TAKE ONE TABLET (15 MG TOTAL) BY MOUTH DAILY. 90 tablet 1  . nystatin cream (MYCOSTATIN) APPLY TO THE AFFECTED AREA(S) TWO TIMES A DAY    . Omega-3 Fatty Acids (FISH OIL) 1000 MG CAPS Take 1,000 mg by mouth.    Marland Kitchen  pantoprazole (PROTONIX) 40 MG tablet Take 1 tablet (40 mg total) by mouth daily. 90 tablet 1  . therapeutic multivitamin-minerals (THERAGRAN-M) tablet Take 1 tablet by mouth daily.     No current facility-administered medications on file prior to visit.   Allergies  Allergen Reactions  . Tape     adhesives   Social History   Socioeconomic History  . Marital status: Married    Spouse name: Not on file  . Number of children: Not on file  . Years of education: Not on file  . Highest education level: Not on file  Occupational History  . Occupation: retired  Tobacco Use  . Smoking status: Never Smoker  . Smokeless tobacco: Never Used  Vaping Use   . Vaping Use: Never used  Substance and Sexual Activity  . Alcohol use: Yes    Comment: rare  . Drug use: No  . Sexual activity: Never    Partners: Male    Birth control/protection: Surgical    Comment: hyst  Other Topics Concern  . Not on file  Social History Narrative  . Not on file   Social Determinants of Health   Financial Resource Strain: Not on file  Food Insecurity: Not on file  Transportation Needs: Not on file  Physical Activity: Not on file  Stress: Not on file  Social Connections: Not on file  Intimate Partner Violence: Not on file     Review of Systems  All other systems reviewed and are negative.      Objective:   Physical Exam Vitals reviewed.  Constitutional:      Appearance: Normal appearance.  HENT:     Right Ear: Tympanic membrane and ear canal normal.     Left Ear: Tympanic membrane and ear canal normal.     Nose: Congestion and rhinorrhea present.  Cardiovascular:     Rate and Rhythm: Normal rate and regular rhythm.     Heart sounds: Normal heart sounds. No murmur heard. No friction rub. No gallop.   Pulmonary:     Effort: Pulmonary effort is normal. No respiratory distress.     Breath sounds: Wheezing present. No rhonchi or rales.  Abdominal:     General: Bowel sounds are normal. There is no distension.     Palpations: Abdomen is soft.     Tenderness: There is no abdominal tenderness.  Musculoskeletal:     Right lower leg: No edema.     Left lower leg: No edema.  Skin:    General: Skin is warm.     Coloration: Skin is not jaundiced.     Findings: No rash.  Neurological:     General: No focal deficit present.     Mental Status: She is alert and oriented to person, place, and time.     Cranial Nerves: No cranial nerve deficit.     Sensory: No sensory deficit.     Motor: No weakness.     Coordination: Coordination normal.     Gait: Gait normal.            Assessment & Plan:  Bronchitis with acute wheezing - Plan:  montelukast (SINGULAIR) 10 MG tablet, albuterol (VENTOLIN HFA) 108 (90 Base) MCG/ACT inhaler, DG Chest 2 View  I believe the patient has developed bronchitis/reactive airway disease/asthma related to allergies.  Begin prednisone 40 mg a day for 7 days.  Add Singulair 10 mg a day and use albuterol 2 puffs inhaled every 4-6 hours as needed.  Proceed with a  chest x-ray to rule out an occult pneumonia.

## 2021-04-07 ENCOUNTER — Other Ambulatory Visit: Payer: Self-pay | Admitting: Family Medicine

## 2021-04-07 DIAGNOSIS — Z4689 Encounter for fitting and adjustment of other specified devices: Secondary | ICD-10-CM | POA: Diagnosis not present

## 2021-04-07 NOTE — Telephone Encounter (Signed)
Ok to refill??  Last office visit 04/03/2021.  Last refill 03/24/2021.

## 2021-04-13 ENCOUNTER — Telehealth: Payer: Self-pay | Admitting: Family Medicine

## 2021-04-13 NOTE — Telephone Encounter (Signed)
Patient called to inform us that she tested positive for COVID 04/12/21 (test was negative Friday 04/10/21). Patient coughed all day yesterday. OTC cough medicine didn't help. Symptoms minimal (no fever, voice sounds frogy).   Patient and spouse are both positive. Patient wants to know how long she needs to isolate herself. Call transferred to [facility].

## 2021-04-29 ENCOUNTER — Other Ambulatory Visit: Payer: Self-pay | Admitting: Family Medicine

## 2021-04-29 DIAGNOSIS — E039 Hypothyroidism, unspecified: Secondary | ICD-10-CM

## 2021-04-30 ENCOUNTER — Other Ambulatory Visit: Payer: Self-pay | Admitting: Family Medicine

## 2021-04-30 NOTE — Telephone Encounter (Signed)
Ok to refill??  Last office visit 04/03/2021.  Last refill 04/07/2021.

## 2021-05-04 MED ORDER — HYDROCODONE BIT-HOMATROP MBR 5-1.5 MG/5ML PO SOLN: ORAL | 0 refills | Status: DC

## 2021-05-04 NOTE — Telephone Encounter (Signed)
Received call from patient.   Reports that she continues to have cough SP COVID.   States that cough is worse at night, and that is when she uses the Hycodan cough medication.   Requested refill. Advised that NP requested OV prior to refill. States that she will gladly come in if PCP requests.   Please advise.

## 2021-05-04 NOTE — Addendum Note (Signed)
Addended by: Phillips Odor on: 05/04/2021 09:52 AM   Modules accepted: Orders

## 2021-06-05 ENCOUNTER — Inpatient Hospital Stay (HOSPITAL_COMMUNITY): Admission: RE | Admit: 2021-06-05 | Payer: Medicare PPO | Source: Ambulatory Visit

## 2021-06-09 ENCOUNTER — Other Ambulatory Visit: Payer: Self-pay

## 2021-06-09 ENCOUNTER — Encounter (HOSPITAL_COMMUNITY)
Admission: RE | Admit: 2021-06-09 | Discharge: 2021-06-09 | Disposition: A | Discharge status: Home / Self Care | Payer: Medicare PPO | Source: Ambulatory Visit | Attending: Family Medicine | Admitting: Family Medicine

## 2021-06-09 DIAGNOSIS — M810 Age-related osteoporosis without current pathological fracture: Secondary | ICD-10-CM | POA: Diagnosis not present

## 2021-06-09 MED ORDER — ZOLEDRONIC ACID 5 MG/100ML IV SOLN
INTRAVENOUS | Status: AC
  Filled 2021-06-09: qty 100

## 2021-06-09 MED ORDER — SODIUM CHLORIDE 0.9 % IV SOLN: Freq: Once | INTRAVENOUS | Status: AC

## 2021-06-09 MED ORDER — ZOLEDRONIC ACID 5 MG/100ML IV SOLN
5.0000 mg | Freq: Once | INTRAVENOUS | Status: AC
  Administered 2021-06-09: 5 mg via INTRAVENOUS

## 2021-06-17 ENCOUNTER — Other Ambulatory Visit: Payer: Self-pay | Admitting: Family Medicine

## 2021-06-17 DIAGNOSIS — K219 Gastro-esophageal reflux disease without esophagitis: Secondary | ICD-10-CM

## 2021-06-23 DIAGNOSIS — Z4689 Encounter for fitting and adjustment of other specified devices: Secondary | ICD-10-CM | POA: Diagnosis not present

## 2021-07-17 ENCOUNTER — Other Ambulatory Visit: Payer: Self-pay | Admitting: Family Medicine

## 2021-07-17 DIAGNOSIS — M810 Age-related osteoporosis without current pathological fracture: Secondary | ICD-10-CM

## 2021-08-03 ENCOUNTER — Other Ambulatory Visit: Payer: Self-pay

## 2021-08-03 ENCOUNTER — Ambulatory Visit (HOSPITAL_COMMUNITY)
Admission: RE | Admit: 2021-08-03 | Discharge: 2021-08-03 | Disposition: A | Discharge status: Home / Self Care | Payer: Medicare PPO | Source: Ambulatory Visit | Attending: Family Medicine | Admitting: Family Medicine

## 2021-08-03 DIAGNOSIS — M810 Age-related osteoporosis without current pathological fracture: Secondary | ICD-10-CM | POA: Diagnosis not present

## 2021-08-03 DIAGNOSIS — M8588 Other specified disorders of bone density and structure, other site: Secondary | ICD-10-CM | POA: Diagnosis not present

## 2021-08-03 DIAGNOSIS — Z780 Asymptomatic menopausal state: Secondary | ICD-10-CM | POA: Diagnosis not present

## 2021-08-13 ENCOUNTER — Telehealth: Payer: Self-pay | Admitting: Family Medicine

## 2021-08-13 NOTE — Telephone Encounter (Signed)
Left message for patient to call back and schedule Medicare Annual Wellness Visit (AWV) in office.  ° °If not able to come in office, please offer to do virtually or by telephone.  Left office number and my jabber #[phone]. ° °Due for AWVI ° °Please schedule at anytime with Nurse Health Advisor. °  °

## 2021-08-17 ENCOUNTER — Other Ambulatory Visit (HOSPITAL_COMMUNITY): Payer: Self-pay | Admitting: Family Medicine

## 2021-08-17 DIAGNOSIS — Z1231 Encounter for screening mammogram for malignant neoplasm of breast: Secondary | ICD-10-CM

## 2021-09-05 ENCOUNTER — Other Ambulatory Visit: Payer: Self-pay | Admitting: Family Medicine

## 2021-09-05 DIAGNOSIS — J40 Bronchitis, not specified as acute or chronic: Secondary | ICD-10-CM

## 2021-09-11 DIAGNOSIS — N8110 Cystocele, unspecified: Secondary | ICD-10-CM | POA: Diagnosis not present

## 2021-09-11 DIAGNOSIS — Z4689 Encounter for fitting and adjustment of other specified devices: Secondary | ICD-10-CM | POA: Diagnosis not present

## 2021-09-21 ENCOUNTER — Ambulatory Visit (HOSPITAL_COMMUNITY)
Admission: RE | Admit: 2021-09-21 | Discharge: 2021-09-21 | Disposition: A | Discharge status: Home / Self Care | Payer: Medicare PPO | Source: Ambulatory Visit | Attending: Family Medicine | Admitting: Family Medicine

## 2021-09-21 ENCOUNTER — Other Ambulatory Visit: Payer: Self-pay

## 2021-09-21 DIAGNOSIS — Z1231 Encounter for screening mammogram for malignant neoplasm of breast: Secondary | ICD-10-CM | POA: Insufficient documentation

## 2021-09-22 ENCOUNTER — Other Ambulatory Visit (HOSPITAL_COMMUNITY): Payer: Self-pay | Admitting: Family Medicine

## 2021-09-22 DIAGNOSIS — R928 Other abnormal and inconclusive findings on diagnostic imaging of breast: Secondary | ICD-10-CM

## 2021-09-22 DIAGNOSIS — N6489 Other specified disorders of breast: Secondary | ICD-10-CM

## 2021-09-29 ENCOUNTER — Ambulatory Visit (HOSPITAL_COMMUNITY)
Admission: RE | Admit: 2021-09-29 | Discharge: 2021-09-29 | Disposition: A | Discharge status: Home / Self Care | Payer: Medicare PPO | Source: Ambulatory Visit | Attending: Family Medicine | Admitting: Family Medicine

## 2021-09-29 ENCOUNTER — Other Ambulatory Visit (HOSPITAL_COMMUNITY): Payer: Self-pay | Admitting: Family Medicine

## 2021-09-29 ENCOUNTER — Other Ambulatory Visit: Payer: Self-pay | Admitting: Family Medicine

## 2021-09-29 ENCOUNTER — Other Ambulatory Visit: Payer: Self-pay

## 2021-09-29 DIAGNOSIS — N6489 Other specified disorders of breast: Secondary | ICD-10-CM | POA: Insufficient documentation

## 2021-09-29 DIAGNOSIS — R922 Inconclusive mammogram: Secondary | ICD-10-CM | POA: Diagnosis not present

## 2021-09-29 DIAGNOSIS — R928 Other abnormal and inconclusive findings on diagnostic imaging of breast: Secondary | ICD-10-CM | POA: Insufficient documentation

## 2021-10-05 ENCOUNTER — Encounter: Payer: Self-pay | Admitting: Family Medicine

## 2021-10-05 ENCOUNTER — Ambulatory Visit (INDEPENDENT_AMBULATORY_CARE_PROVIDER_SITE_OTHER): Payer: Medicare PPO | Admitting: Family Medicine

## 2021-10-05 ENCOUNTER — Other Ambulatory Visit: Payer: Self-pay

## 2021-10-05 VITALS — BP 128/78 | HR 66 | Temp 97.2°F | Resp 18 | Ht 62.0 in | Wt 145.0 lb

## 2021-10-05 DIAGNOSIS — Z13220 Encounter for screening for lipoid disorders: Secondary | ICD-10-CM

## 2021-10-05 DIAGNOSIS — E039 Hypothyroidism, unspecified: Secondary | ICD-10-CM

## 2021-10-05 DIAGNOSIS — Z23 Encounter for immunization: Secondary | ICD-10-CM

## 2021-10-05 DIAGNOSIS — Z0000 Encounter for general adult medical examination without abnormal findings: Secondary | ICD-10-CM | POA: Diagnosis not present

## 2021-10-05 NOTE — Progress Notes (Signed)
Subjective:    Patient ID: Gina Gibbs, female    DOB: 1946/01/17, 75 y.o.   MRN: [id number]  HPI  Patient is a very pleasant 75 year old Caucasian female who is here today for a physical exam.  She had an abnormal mammogram last week and they are scheduling her for a biopsy on Friday.  Her bone density test was performed earlier today and showed improvement in her T-scores.  She has recently discontinued Reclast and is currently just being monitored.  She is on calcium and vitamin D.  Her cologuard is up-to-date and is not due until 2023.  She does not require Pap smear due to age.  She is due for a flu shot.  Past Medical History:  Diagnosis Date   Arthritis    Cataract    Mixed forms OU   Complication of anesthesia    GERD (gastroesophageal reflux disease) 02/28/2013   Hyperlipidemia 02/28/2013   Hypothyroid    Hypothyroid 09/26/2013   Hypothyroidism 03/31/2020   Macular degeneration    non-exu OU   Osteoporosis    PONV (postoperative nausea and vomiting)    Past Surgical History:  Procedure Laterality Date   ABDOMINAL HYSTERECTOMY     EAR CYST EXCISION  02/23/2012   Procedure: CYST REMOVAL;  Surgeon: Nicki Reaper, MD;  Location: [facility];  Service: Orthopedics;  Laterality: Left;  debridement distal interphalangeal left index finger, excision cyst/loose body   FRACTURE SURGERY  09/2013   right ankle   spinal cyst removal     Current Outpatient Medications on File Prior to Visit  Medication Sig Dispense Refill   albuterol (VENTOLIN HFA) 108 (90 Base) MCG/ACT inhaler Inhale 2 puffs into the lungs every 6 (six) hours as needed for wheezing or shortness of breath. 8 g 0   Cholecalciferol 125 MCG (5000 UT) TABS Vitamin D3 125 mcg (5,000 unit) tablet   1 tablet every day by oral route.     levothyroxine (SYNTHROID) 25 MCG tablet TAKE ONE (1) TABLET BY MOUTH EVERY DAY 90 tablet 1   meloxicam (MOBIC) 15 MG tablet TAKE ONE TABLET (15 MG TOTAL) BY MOUTH DAILY. 90  tablet 1   nystatin cream (MYCOSTATIN) APPLY TO THE AFFECTED AREA(S) TWO TIMES A DAY     Omega-3 Fatty Acids (FISH OIL) 1000 MG CAPS Take 1,000 mg by mouth.     pantoprazole (PROTONIX) 40 MG tablet TAKE ONE TABLET (40MG  TOTAL) BY MOUTH DAILY 90 tablet 1   therapeutic multivitamin-minerals (THERAGRAN-M) tablet Take 1 tablet by mouth daily.     montelukast (SINGULAIR) 10 MG tablet TAKE ONE TABLET BY MOUTH EVERY NIGHT AT BEDTIME (Patient not taking: Reported on 10/05/2021) 30 tablet 3   No current facility-administered medications on file prior to visit.   Allergies  Allergen Reactions   Tape     adhesives   Social History   Socioeconomic History   Marital status: Married    Spouse name: Not on file   Number of children: Not on file   Years of education: Not on file   Highest education level: Not on file  Occupational History   Occupation: retired  Tobacco Use   Smoking status: Never   Smokeless tobacco: Never  Vaping Use   Vaping Use: Never used  Substance and Sexual Activity   Alcohol use: Yes    Comment: rare   Drug use: No   Sexual activity: Never    Partners: Male    Birth control/protection: Surgical  Comment: hyst  Other Topics Concern   Not on file  Social History Narrative   Not on file   Social Determinants of Health   Financial Resource Strain: Not on file  Food Insecurity: Not on file  Transportation Needs: Not on file  Physical Activity: Not on file  Stress: Not on file  Social Connections: Not on file  Intimate Partner Violence: Not on file     Review of Systems  All other systems reviewed and are negative.     Objective:   Physical Exam Vitals reviewed.  Constitutional:      Appearance: Normal appearance.  Cardiovascular:     Rate and Rhythm: Normal rate and regular rhythm.     Heart sounds: Normal heart sounds. No murmur heard.   No friction rub. No gallop.  Pulmonary:     Effort: Pulmonary effort is normal. No respiratory distress.      Breath sounds: Normal breath sounds. No wheezing, rhonchi or rales.  Abdominal:     General: Bowel sounds are normal. There is no distension.     Palpations: Abdomen is soft.     Tenderness: There is no abdominal tenderness.  Musculoskeletal:     Right lower leg: No edema.     Left lower leg: No edema.  Skin:    General: Skin is warm.     Coloration: Skin is not jaundiced.     Findings: No rash.  Neurological:     General: No focal deficit present.     Mental Status: She is alert and oriented to person, place, and time.     Cranial Nerves: No cranial nerve deficit.     Sensory: No sensory deficit.     Motor: No weakness.     Coordination: Coordination normal.     Gait: Gait normal.           Assessment & Plan:  Hypothyroidism, unspecified type - Plan: CBC with Differential/Platelet, COMPLETE METABOLIC PANEL WITH GFR, Lipid panel, TSH  Screening, lipid - Plan: CBC with Differential/Platelet, COMPLETE METABOLIC PANEL WITH GFR, Lipid panel, TSH  General medical examination - Plan: CBC with Differential/Platelet, COMPLETE METABOLIC PANEL WITH GFR, Lipid panel, TSH  Need for immunization against influenza - Plan: Flu Vaccine QUAD High Dose(Fluad) Patient had a negative Cologuard in 2020.  She is due again in 2023.  Bone density test is due in 2024.  Does not require Pap smear.  Received flu shot today.  Check CBC CMP lipid panel and TSH.  Blood pressure today is excellent.  I wished her good luck on her biopsy Friday.

## 2021-10-06 LAB — CBC WITH DIFFERENTIAL/PLATELET
Absolute Monocytes: 606 cells/uL (ref 200–950)
Basophils Absolute: 50 cells/uL (ref 0–200)
Basophils Relative: 0.6 %
Eosinophils Absolute: 174 cells/uL (ref 15–500)
Eosinophils Relative: 2.1 %
HCT: 41 % (ref 35.0–45.0)
Hemoglobin: 13.9 g/dL (ref 11.7–15.5)
Lymphs Abs: 2158 cells/uL (ref 850–3900)
MCH: 31.7 pg (ref 27.0–33.0)
MCHC: 33.9 g/dL (ref 32.0–36.0)
MCV: 93.4 fL (ref 80.0–100.0)
MPV: 10.3 fL (ref 7.5–12.5)
Monocytes Relative: 7.3 %
Neutro Abs: 5312 cells/uL (ref 1500–7800)
Neutrophils Relative %: 64 %
Platelets: 302 10*3/uL (ref 140–400)
RBC: 4.39 10*6/uL (ref 3.80–5.10)
RDW: 12.5 % (ref 11.0–15.0)
Total Lymphocyte: 26 %
WBC: 8.3 10*3/uL (ref 3.8–10.8)

## 2021-10-06 LAB — COMPLETE METABOLIC PANEL WITH GFR
AG Ratio: 2.1 (calc) (ref 1.0–2.5)
ALT: 13 U/L (ref 6–29)
AST: 13 U/L (ref 10–35)
Albumin: 4.5 g/dL (ref 3.6–5.1)
Alkaline phosphatase (APISO): 52 U/L (ref 37–153)
BUN: 17 mg/dL (ref 7–25)
CO2: 25 mmol/L (ref 20–32)
Calcium: 9.4 mg/dL (ref 8.6–10.4)
Chloride: 106 mmol/L (ref 98–110)
Creat: 0.76 mg/dL (ref 0.60–1.00)
Globulin: 2.1 g/dL (calc) (ref 1.9–3.7)
Glucose, Bld: 96 mg/dL (ref 65–99)
Potassium: 4.1 mmol/L (ref 3.5–5.3)
Sodium: 143 mmol/L (ref 135–146)
Total Bilirubin: 1.2 mg/dL (ref 0.2–1.2)
Total Protein: 6.6 g/dL (ref 6.1–8.1)
eGFR: 83 mL/min/{1.73_m2} (ref >=60)

## 2021-10-06 LAB — LIPID PANEL
Cholesterol: 205 mg/dL — ABNORMAL HIGH (ref <200)
HDL: 61 mg/dL (ref >=50)
LDL Cholesterol (Calc): 121 mg/dL (calc) — ABNORMAL HIGH
Non-HDL Cholesterol (Calc): 144 mg/dL (calc) — ABNORMAL HIGH (ref <130)
Total CHOL/HDL Ratio: 3.4 (calc) (ref <5.0)
Triglycerides: 120 mg/dL (ref <150)

## 2021-10-06 LAB — TSH: TSH: 2.29 mIU/L (ref 0.40–4.50)

## 2021-10-09 ENCOUNTER — Ambulatory Visit
Admission: RE | Admit: 2021-10-09 | Discharge: 2021-10-09 | Disposition: A | Discharge status: Home / Self Care | Payer: Medicare PPO | Source: Ambulatory Visit | Attending: Family Medicine | Admitting: Family Medicine

## 2021-10-09 ENCOUNTER — Other Ambulatory Visit: Payer: Self-pay

## 2021-10-09 DIAGNOSIS — R928 Other abnormal and inconclusive findings on diagnostic imaging of breast: Secondary | ICD-10-CM

## 2021-10-09 DIAGNOSIS — N6031 Fibrosclerosis of right breast: Secondary | ICD-10-CM | POA: Diagnosis not present

## 2021-10-09 HISTORY — PX: BREAST BIOPSY: SHX20

## 2021-10-20 ENCOUNTER — Encounter: Payer: Self-pay | Admitting: *Deleted

## 2021-10-27 ENCOUNTER — Other Ambulatory Visit: Payer: Self-pay | Admitting: Family Medicine

## 2021-10-27 ENCOUNTER — Other Ambulatory Visit (HOSPITAL_COMMUNITY): Payer: Medicare PPO

## 2021-10-27 ENCOUNTER — Encounter (HOSPITAL_COMMUNITY): Payer: Medicare PPO

## 2021-10-27 DIAGNOSIS — E039 Hypothyroidism, unspecified: Secondary | ICD-10-CM

## 2021-11-03 ENCOUNTER — Other Ambulatory Visit: Payer: Self-pay | Admitting: Family Medicine

## 2021-11-03 MED ORDER — NIRMATRELVIR/RITONAVIR (PAXLOVID)TABLET: 3.0000 | ORAL_TABLET | Freq: Two times a day (BID) | ORAL | 0 refills | Status: AC

## 2021-11-24 ENCOUNTER — Other Ambulatory Visit: Payer: Self-pay

## 2021-11-24 MED ORDER — FLUTICASONE PROPIONATE 50 MCG/ACT NA SUSP: 2.0000 | Freq: Every day | NASAL | 6 refills | Status: DC

## 2021-12-01 ENCOUNTER — Emergency Department (HOSPITAL_COMMUNITY): Payer: Medicare PPO

## 2021-12-01 ENCOUNTER — Emergency Department (HOSPITAL_COMMUNITY)
Admission: EM | Admit: 2021-12-01 | Discharge: 2021-12-01 | Disposition: A | Discharge status: Home / Self Care | Payer: Medicare PPO | Attending: Emergency Medicine | Admitting: Emergency Medicine

## 2021-12-01 ENCOUNTER — Encounter (HOSPITAL_COMMUNITY): Payer: Self-pay | Admitting: *Deleted

## 2021-12-01 DIAGNOSIS — S0181XA Laceration without foreign body of other part of head, initial encounter: Secondary | ICD-10-CM | POA: Diagnosis not present

## 2021-12-01 DIAGNOSIS — W19XXXA Unspecified fall, initial encounter: Secondary | ICD-10-CM

## 2021-12-01 DIAGNOSIS — S0993XA Unspecified injury of face, initial encounter: Secondary | ICD-10-CM | POA: Diagnosis not present

## 2021-12-01 DIAGNOSIS — W010XXA Fall on same level from slipping, tripping and stumbling without subsequent striking against object, initial encounter: Secondary | ICD-10-CM | POA: Diagnosis not present

## 2021-12-01 DIAGNOSIS — Z23 Encounter for immunization: Secondary | ICD-10-CM | POA: Insufficient documentation

## 2021-12-01 DIAGNOSIS — R519 Headache, unspecified: Secondary | ICD-10-CM | POA: Diagnosis not present

## 2021-12-01 MED ORDER — TETANUS-DIPHTH-ACELL PERTUSSIS 5-2.5-18.5 LF-MCG/0.5 IM SUSY
0.5000 mL | PREFILLED_SYRINGE | Freq: Once | INTRAMUSCULAR | Status: AC
  Administered 2021-12-01: 0.5 mL via INTRAMUSCULAR
  Filled 2021-12-01: qty 0.5

## 2021-12-01 NOTE — ED Triage Notes (Signed)
Laceration to chin

## 2021-12-01 NOTE — ED Triage Notes (Signed)
Fell on ground , laceration to chin

## 2021-12-01 NOTE — Discharge Instructions (Signed)
You have a laceration underneath your chin I have applied skin glue to the area, please refrain getting it wet for the first 24 hours, after that you may apply clean water over the area please not pick at it as this will make it fall off, typically this will fall off on its own in the next 7 to 10 days.  Recommend over-the-counter pain medication needed for pain.  May follow with your PCP as needed.  Come back to the emergency department if you develop chest pain, shortness of breath, severe abdominal pain, uncontrolled nausea, vomiting, diarrhea.

## 2021-12-01 NOTE — ED Provider Notes (Signed)
[facility] EMERGENCY DEPARTMENT Provider Note   CSN: [id number] Arrival date & time: 12/01/21  1425     History  Chief Complaint  Patient presents with   Laceration    Gina Gibbs is a 76 y.o. female.  HPI  Patient without significant medical history presents with chief complaint of a fall.  She states that today while she was walking she tripped and fell onto her left side, she states her head hit the floor, she denies loss of conscious, is not on anticoagulant.  She states she has pain in the left side of her face as well as a slight headache, but denies any change in vision, paresthesias or weakness upper lower extremities, denies any neck, back pain, pain in her upper or lower extremities, denies any chest or abdominal pain.  She notes that she has a small laceration underneath her left chin, it was bleeding profusely but was able to control the bleeding with direct pressure.  She denies any trauma within her mouth has no other complaints.  She is up-to-date on her tetanus shot, is not immunocompromise.  Home Medications Prior to Admission medications   Medication Sig Start Date End Date Taking? Authorizing Provider  albuterol (VENTOLIN HFA) 108 (90 Base) MCG/ACT inhaler Inhale 2 puffs into the lungs every 6 (six) hours as needed for wheezing or shortness of breath. 04/03/21   Susy Frizzle, MD  Cholecalciferol 125 MCG (5000 UT) TABS Vitamin D3 125 mcg (5,000 unit) tablet   1 tablet every day by oral route.    [provider]  fluticasone (FLONASE) 50 MCG/ACT nasal spray Place 2 sprays into both nostrils daily. 11/24/21   Susy Frizzle, MD  levothyroxine (SYNTHROID) 25 MCG tablet TAKE ONE (1) TABLET BY MOUTH EVERY DAY 10/27/21   Susy Frizzle, MD  meloxicam (MOBIC) 15 MG tablet TAKE ONE TABLET (15 MG TOTAL) BY MOUTH DAILY. 07/17/21   Susy Frizzle, MD  montelukast (SINGULAIR) 10 MG tablet TAKE ONE TABLET BY MOUTH EVERY NIGHT AT BEDTIME Patient not taking:  Reported on 10/05/2021 09/07/21   Susy Frizzle, MD  nystatin cream (MYCOSTATIN) APPLY TO THE AFFECTED AREA(S) TWO TIMES A DAY 11/25/20   [provider]  Omega-3 Fatty Acids (FISH OIL) 1000 MG CAPS Take 1,000 mg by mouth.    [provider]  pantoprazole (PROTONIX) 40 MG tablet TAKE ONE TABLET (40MG  TOTAL) BY MOUTH DAILY 06/17/21   Susy Frizzle, MD  therapeutic multivitamin-minerals [facility]) tablet Take 1 tablet by mouth daily.    [provider]      Allergies    Tape    Review of Systems   Review of Systems  Constitutional:  Negative for chills and fever.  HENT:  Positive for facial swelling. Negative for sore throat and tinnitus.   Eyes:  Negative for visual disturbance.  Respiratory:  Negative for shortness of breath.   Cardiovascular:  Negative for chest pain.  Gastrointestinal:  Negative for abdominal pain.  Neurological:  Positive for headaches. Negative for dizziness.   Physical Exam Updated Vital Signs BP (!) 144/72 (BP Location: Left Arm)    Pulse 64    Temp 98 F (36.7 C) (Oral)    Resp 17    SpO2 95%  Physical Exam Vitals and nursing note reviewed.  Constitutional:      General: She is not in acute distress.    Appearance: She is not ill-appearing.  HENT:     Head: Normocephalic and  atraumatic.     Comments: Patient is a small laceration below her chin on the left aspect measures approximately 1 cm in length possibly 1 mm in depth hemodynamically stable there is no other gross otherwise of the head, no raccoon eyes about sign present head was nontender to palpation.    Nose: No congestion.     Mouth/Throat:     Mouth: Mucous membranes are moist.     Pharynx: Oropharynx is clear. No oropharyngeal exudate or posterior oropharyngeal erythema.     Comments: Oropharynx is visualized tongue uvula both midline no oral trauma present.  No trismus or torticollis present. Eyes:     Conjunctiva/sclera: Conjunctivae normal.   Cardiovascular:     Rate and Rhythm: Normal rate and regular rhythm.     Pulses: Normal pulses.     Heart sounds: No murmur heard.   No friction rub. No gallop.  Pulmonary:     Effort: No respiratory distress.     Breath sounds: No wheezing, rhonchi or rales.  Chest:     Chest wall: No tenderness.  Abdominal:     Palpations: Abdomen is soft.     Tenderness: There is no abdominal tenderness. There is no right CVA tenderness or left CVA tenderness.  Musculoskeletal:     Right lower leg: No edema.     Left lower leg: No edema.     Comments: Spine was palpated is nontender to palpation, no step-off deformities present, no pelvis instability no leg shortening  Upper lower extremities were palpated they were nontender to palpation.  Skin:    General: Skin is warm and dry.  Neurological:     Mental Status: She is alert.     Comments: No facial asymmetry, no difficult word finding, able follow two-step commands, no unilateral weakness present.  Psychiatric:        Mood and Affect: Mood normal.    ED Results / Procedures / Treatments   Labs (all labs ordered are listed, but only abnormal results are displayed) Labs Reviewed - No data to display  EKG None  Radiology CT Head Wo Contrast  Result Date: 12/01/2021 CLINICAL DATA:  Facial trauma headache EXAM: CT HEAD WITHOUT CONTRAST CT MAXILLOFACIAL WITHOUT CONTRAST TECHNIQUE: Multidetector CT imaging of the head and maxillofacial structures were performed using the standard protocol without intravenous contrast. Multiplanar CT image reconstructions of the maxillofacial structures were also generated. RADIATION DOSE REDUCTION: This exam was performed according to the departmental dose-optimization program which includes automated exposure control, adjustment of the mA and/or kV according to patient size and/or use of iterative reconstruction technique. COMPARISON:  None. FINDINGS: CT HEAD FINDINGS Brain: No evidence of acute infarction,  hemorrhage, hydrocephalus, extra-axial collection or mass lesion/mass effect. Vascular: No hyperdense vessel or unexpected calcification. Skull: Normal. Negative for fracture or focal lesion. Other: None CT MAXILLOFACIAL FINDINGS Osseous: Mastoid air cells are clear. Mandibular heads are normally position. No mandibular fracture. Pterygoid plates and zygomatic arches are intact. No acute nasal bone fracture. Orbits: Negative. No traumatic or inflammatory finding. Sinuses: Clear. Soft tissues: Negative. IMPRESSION: 1. Negative non contrasted CT appearance of the brain for age 89. No acute facial bone fracture identified Electronically Signed   By: Donavan Foil M.D.   On: 12/01/2021 17:59   CT Maxillofacial Wo Contrast  Result Date: 12/01/2021 CLINICAL DATA:  Facial trauma headache EXAM: CT HEAD WITHOUT CONTRAST CT MAXILLOFACIAL WITHOUT CONTRAST TECHNIQUE: Multidetector CT imaging of the head and maxillofacial structures were performed using the standard protocol  without intravenous contrast. Multiplanar CT image reconstructions of the maxillofacial structures were also generated. RADIATION DOSE REDUCTION: This exam was performed according to the departmental dose-optimization program which includes automated exposure control, adjustment of the mA and/or kV according to patient size and/or use of iterative reconstruction technique. COMPARISON:  None. FINDINGS: CT HEAD FINDINGS Brain: No evidence of acute infarction, hemorrhage, hydrocephalus, extra-axial collection or mass lesion/mass effect. Vascular: No hyperdense vessel or unexpected calcification. Skull: Normal. Negative for fracture or focal lesion. Other: None CT MAXILLOFACIAL FINDINGS Osseous: Mastoid air cells are clear. Mandibular heads are normally position. No mandibular fracture. Pterygoid plates and zygomatic arches are intact. No acute nasal bone fracture. Orbits: Negative. No traumatic or inflammatory finding. Sinuses: Clear. Soft tissues: Negative.  IMPRESSION: 1. Negative non contrasted CT appearance of the brain for age 61. No acute facial bone fracture identified Electronically Signed   By: Donavan Foil M.D.   On: 12/01/2021 17:59    Procedures .Marland KitchenLaceration Repair  Date/Time: 12/01/2021 7:00 PM Performed by: Marcello Fennel, PA-C Authorized by: Marcello Fennel, PA-C   Consent:    Consent obtained:  Verbal   Consent given by:  Patient   Risks discussed:  Infection, pain, retained foreign body, need for additional repair, poor cosmetic result, tendon damage, vascular damage, poor wound healing and nerve damage   Alternatives discussed:  No treatment, delayed treatment, observation and referral Universal protocol:    Patient identity confirmed:  Verbally with patient Anesthesia:    Anesthesia method:  None Laceration details:    Location:  Face   Face location:  Chin   Length (cm):  1   Depth (mm):  1 Pre-procedure details:    Preparation:  Patient was prepped and draped in usual sterile fashion and imaging obtained to evaluate for foreign bodies Exploration:    Limited defect created (wound extended): no     Imaging outcome: foreign body not noted     Wound exploration: wound explored through full range of motion and entire depth of wound visualized     Contaminated: no   Treatment:    Area cleansed with:  Saline   Amount of cleaning:  Standard   Visualized foreign bodies/material removed: no   Skin repair:    Repair method:  Steri-Strips and tissue adhesive   Number of Steri-Strips:  2 Approximation:    Approximation:  Close Repair type:    Repair type:  Simple Post-procedure details:    Procedure completion:  Tolerated well, no immediate complications    Medications Ordered in ED Medications  Tdap (BOOSTRIX) injection 0.5 mL (0.5 mLs Intramuscular Given 12/01/21 1732)    ED Course/ Medical Decision Making/ A&P                           Medical Decision Making  This patient presents to the ED for  concern of fall, this involves an extensive number of treatment options, and is a complaint that carries with it a high risk of complications and morbidity.  The differential diagnosis includes orthopedic injury, head bleed    Additional history obtained:  Additional history obtained from electronic medical record, patient's son    Co morbidities that complicate the patient evaluation  N/A  Social Determinants of Health:  N/A    Lab Tests:  I Ordered, and personally interpreted labs.  The pertinent results include: N/A   Imaging Studies ordered:  I ordered imaging studies including CT head maxillofacial I  independently visualized and interpreted imaging which showed both negative for acute findings I agree with the radiologist interpretation    Reevaluation:  Patient is a very small wound underneath her left chin, does not gape with movements but to help with the healing process will rinse out the wound and apply Steri-Strips and some skin glue patient agreed in this plan and tolerated the procedure well    Rule out low suspicion for intracranial head bleed as patient denies loss of conscious, is not on anticoagulant, she does not endorse headaches, paresthesia/weakness in the upper and lower extremities, no focal deficits present on my exam, CT head negative for acute findings.  Low suspicion for spinal cord abnormality or spinal fracture spine was palpated was nontender to palpation, patient has full range of motion in the upper and lower extremities.  Low suspicion for intrathoracic and/or intra-abdominal abnormality as there is no gross deformities present my exam, areas were nontender to palpation no suspicion for internal bleeds at this time    Dispostion and problem list  After consideration of the diagnostic results and the patients response to treatment, I feel that the patent would benefit from   Fall-likely patient will have muscular strain her next couple  days will recommend over-the-counter pain medications.  Patient is a small laceration on her chin, skin glue was placed, recommend basic wound care follow-up with PCP as needed.  Given strict return precautions..             Final Clinical Impression(s) / ED Diagnoses Final diagnoses:  Fall, initial encounter  Chin laceration, initial encounter    Rx / DC Orders ED Discharge Orders     None         Aron Baba 12/01/21 1903    Godfrey Pick, MD 12/02/21 0221

## 2021-12-01 NOTE — ED Notes (Signed)
Wound irrigated with sterile water

## 2021-12-02 DIAGNOSIS — Z01419 Encounter for gynecological examination (general) (routine) without abnormal findings: Secondary | ICD-10-CM | POA: Diagnosis not present

## 2021-12-02 DIAGNOSIS — Z6827 Body mass index (BMI) 27.0-27.9, adult: Secondary | ICD-10-CM | POA: Diagnosis not present

## 2021-12-09 DIAGNOSIS — Z79899 Other long term (current) drug therapy: Secondary | ICD-10-CM | POA: Diagnosis not present

## 2021-12-09 DIAGNOSIS — Z803 Family history of malignant neoplasm of breast: Secondary | ICD-10-CM | POA: Diagnosis not present

## 2021-12-09 DIAGNOSIS — J302 Other seasonal allergic rhinitis: Secondary | ICD-10-CM | POA: Diagnosis not present

## 2021-12-09 DIAGNOSIS — E039 Hypothyroidism, unspecified: Secondary | ICD-10-CM | POA: Diagnosis not present

## 2021-12-09 DIAGNOSIS — M1990 Unspecified osteoarthritis, unspecified site: Secondary | ICD-10-CM | POA: Diagnosis not present

## 2021-12-09 DIAGNOSIS — G8929 Other chronic pain: Secondary | ICD-10-CM | POA: Diagnosis not present

## 2021-12-09 DIAGNOSIS — Z791 Long term (current) use of non-steroidal anti-inflammatories (NSAID): Secondary | ICD-10-CM | POA: Diagnosis not present

## 2021-12-09 DIAGNOSIS — M8580 Other specified disorders of bone density and structure, unspecified site: Secondary | ICD-10-CM | POA: Diagnosis not present

## 2021-12-09 DIAGNOSIS — K219 Gastro-esophageal reflux disease without esophagitis: Secondary | ICD-10-CM | POA: Diagnosis not present

## 2021-12-14 ENCOUNTER — Telehealth: Payer: Self-pay

## 2021-12-14 NOTE — Telephone Encounter (Signed)
Pt's husband called in to state pt started feeling sick this morning with fever and body aches. She has taken 2 at-home COVID tests and both are negative. He wanted to know if it could be the flu.   Advised husband pt could be testing too early for COVID, they may want to retest in 24 hours. Also advised we cannot tell if it is the flu without a test. Offered appt to come in and be evaluated and tested, he declined and states pt doesn't feel like going anywhere. I suggested he try her My Chart for an E-Visit or virtual visit for recommendations. Also reviewed symptom management and appropriate OTC medications. He voiced understanding. He will call back tomorrow if pt would like to be seen. Nothing further needed at this time.

## 2021-12-15 ENCOUNTER — Other Ambulatory Visit: Payer: Self-pay | Admitting: Family Medicine

## 2021-12-15 DIAGNOSIS — K219 Gastro-esophageal reflux disease without esophagitis: Secondary | ICD-10-CM

## 2021-12-21 DIAGNOSIS — H2513 Age-related nuclear cataract, bilateral: Secondary | ICD-10-CM | POA: Diagnosis not present

## 2021-12-21 DIAGNOSIS — H0288B Meibomian gland dysfunction left eye, upper and lower eyelids: Secondary | ICD-10-CM | POA: Diagnosis not present

## 2021-12-21 DIAGNOSIS — H43393 Other vitreous opacities, bilateral: Secondary | ICD-10-CM | POA: Diagnosis not present

## 2021-12-21 DIAGNOSIS — H35372 Puckering of macula, left eye: Secondary | ICD-10-CM | POA: Diagnosis not present

## 2021-12-21 DIAGNOSIS — H04123 Dry eye syndrome of bilateral lacrimal glands: Secondary | ICD-10-CM | POA: Diagnosis not present

## 2021-12-21 DIAGNOSIS — H0288A Meibomian gland dysfunction right eye, upper and lower eyelids: Secondary | ICD-10-CM | POA: Diagnosis not present

## 2021-12-21 DIAGNOSIS — H5203 Hypermetropia, bilateral: Secondary | ICD-10-CM | POA: Diagnosis not present

## 2022-01-21 ENCOUNTER — Encounter (INDEPENDENT_AMBULATORY_CARE_PROVIDER_SITE_OTHER): Payer: Medicare PPO | Admitting: Ophthalmology

## 2022-01-22 ENCOUNTER — Other Ambulatory Visit: Payer: Self-pay | Admitting: Family Medicine

## 2022-01-22 DIAGNOSIS — M810 Age-related osteoporosis without current pathological fracture: Secondary | ICD-10-CM

## 2022-01-27 DIAGNOSIS — N8111 Cystocele, midline: Secondary | ICD-10-CM | POA: Diagnosis not present

## 2022-02-05 NOTE — Progress Notes (Signed)
?[facility] Note ? ?02/08/2022 ? ?  ? ?CHIEF COMPLAINT ?Patient presents for Retina Follow Up ? ? ?HISTORY OF PRESENT ILLNESS: ?Gina Gibbs is a 76 y.o. female who presents to the clinic today for:  ? ?HPI   ? ? Retina Follow Up   ?Patient presents with  Other.  In both eyes.  Duration of 12 months.  Since onset it is stable.  I, the attending physician,  performed the HPI with the patient and updated documentation appropriately. ? ?  ?  ? ? 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  She ordered new glasses with Dr. Katy Fitch, she has not picked them up yet.  Denies any new floaters, FOLs, or change in vision.   ?She had a bad fall in Jan. 2023.  She hit the side of her face and head Left side, did not notice any change in vision or floaters.  She went to the ED and had a CT scan.  ? ?  ?  ?Last edited by Bernarda Caffey, MD on 02/08/2022  9:45 PM.  ?  ? ?pt states vision has decreased, but she has ordered new glasses from Dr. Katy Fitch, no new health concerns, but pt states she fell in January and hit her head, she was dx with a mild concussion ? ? ?Referring physician: ?Susy Frizzle, MD ?[address] [place],  [place] [address] ? ?HISTORICAL INFORMATION:  ? 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  ? ?CURRENT MEDICATIONS: ?No current outpatient medications on file. (Ophthalmic Drugs)  ? ?No current facility-administered medications for this visit. (Ophthalmic Drugs)  ? ?Current Outpatient Medications (Other)  ?Medication Sig  ? Cholecalciferol 125 MCG (5000 UT) TABS Vitamin D3 125 mcg (5,000 unit) tablet ?  1 tablet every day by oral route.  ? fluticasone (FLONASE) 50 MCG/ACT nasal spray Place 2 sprays into both nostrils daily.  ? levothyroxine (SYNTHROID) 25 MCG tablet TAKE ONE (1) TABLET BY MOUTH  EVERY DAY  ? meloxicam (MOBIC) 15 MG tablet TAKE ONE TABLET (15 MG TOTAL) BY MOUTH DAILY.  ? montelukast (SINGULAIR) 10 MG tablet TAKE ONE TABLET BY MOUTH EVERY NIGHT AT BEDTIME  ? nystatin cream (MYCOSTATIN) APPLY TO THE AFFECTED AREA(S) TWO TIMES A DAY  ? Omega-3 Fatty Acids (FISH OIL) 1000 MG CAPS Take 1,000 mg by mouth.  ? pantoprazole (PROTONIX) 40 MG tablet TAKE ONE TABLET (40MG  TOTAL) BY MOUTH DAILY  ? therapeutic multivitamin-minerals (THERAGRAN-M) tablet Take 1 tablet by mouth daily.  ? albuterol (VENTOLIN HFA) 108 (90 Base) MCG/ACT inhaler Inhale 2 puffs into the lungs every 6 (six) hours as needed for wheezing or shortness of breath. (Patient not taking: Reported on 02/08/2022)  ? ?No current facility-administered medications for this visit. (Other)  ? ?REVIEW OF SYSTEMS: ?ROS   ?Positive for: Gastrointestinal, Musculoskeletal, Endocrine, Eyes ?Negative for: Constitutional, Neurological, Skin, Genitourinary, HENT, Cardiovascular, Respiratory, Psychiatric, Allergic/Imm, Heme/Lymph ?Last edited by Leonie Douglas, COA on 02/08/2022 10:16 AM.  ?  ? ?ALLERGIES ?Allergies  ?Allergen Reactions  ? Tape   ?  adhesives  ? ?PAST MEDICAL HISTORY ?Past Medical History:  ?Diagnosis Date  ? Arthritis   ? Cataract   ? Mixed forms OU  ? Complication of anesthesia   ? GERD (gastroesophageal reflux  disease) 02/28/2013  ? Hyperlipidemia 02/28/2013  ? Hypothyroid   ? Hypothyroid 09/26/2013  ? Hypothyroidism 03/31/2020  ? 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 Osteoporosis   ? PONV (postoperative nausea and vomiting)   ? ?Past Surgical History:  ?Procedure Laterality Date  ? ABDOMINAL HYSTERECTOMY    ? BREAST BIOPSY Right 10/09/2021  ? Benign Breast tissue with increased stromal fibrosis and adenosis with calcifications- No malignancy identfied.  ? EAR CYST EXCISION  02/23/2012  ? Procedure: CYST REMOVAL;  Surgeon: Wynonia Sours, MD;  Location: [facility];  Service: Orthopedics;  Laterality: Left;  debridement distal  interphalangeal left index finger, excision cyst/loose body  ? FRACTURE SURGERY  09/2013  ? right ankle  ? spinal cyst removal    ? ?FAMILY HISTORY ?Family History  ?Problem Relation Age of Onset  ? Diabetes Mother   ? ?SOCIAL HISTORY ?Social History  ? ?Tobacco Use  ? Smoking status: Never  ? Smokeless tobacco: Never  ?Vaping Use  ? Vaping Use: Never used  ?Substance Use Topics  ? Alcohol use: Yes  ?  Comment: rare  ? Drug use: No  ?  ? ?  ?OPHTHALMIC EXAM: ? ?Base Eye Exam   ? ? Visual Acuity (Snellen - Linear)   ? ?   Right Left  ? Dist cc 20/50 +1 20/30 +2  ? Dist ph cc 20/40 +1 20/25 +2  ? ? Correction: Glasses  ? ?  ?  ? ? Tonometry (Tonopen, 10:28 AM)   ? ?   Right Left  ? Pressure 17 17  ? ?  ?  ? ? Pupils   ? ?   Dark Light Shape React APD  ? Right 3 2 Round Brisk None  ? Left 3 2 Round Brisk None  ? ?  ?  ? ? Visual Fields (Counting fingers)   ? ?   Left Right  ?  Full Full  ? ?  ?  ? ? Extraocular Movement   ? ?   Right Left  ?  Full Full  ? ?  ?  ? ? Neuro/Psych   ? ? Oriented x3: Yes  ? Mood/Affect: Normal  ? ?  ?  ? ? Dilation   ? ? Both eyes: 1.0% Mydriacyl, 2.5% Phenylephrine @ 10:28 AM  ? ?  ?  ? ?  ? ?Slit Lamp and Fundus Exam   ? ? Slit Lamp Exam   ? ?   Right Left  ? Lids/Lashes Dermatochalasis - upper lid, mild Meibomian gland dysfunction Dermatochalasis - upper lid, mild Meibomian gland dysfunction  ? Conjunctiva/Sclera White and quiet White and quiet  ? Cornea trace PEE trace PEE  ? Anterior Chamber deep, narrow temporal angle deep, narrow temporal angle  ? Iris Round and dilated Round and dilated  ? Lens 2-3+ Nuclear sclerosis with early brunescence, 2-3+ Cortical cataract 2-3+ Nuclear sclerosis with early brunescence, 2-3+ Cortical cataract  ? Anterior Vitreous Vitreous syneresis, Posterior vitreous detachment Vitreous syneresis, Posterior vitreous detachment  ? ?  ?  ? ? Fundus Exam   ? ?   Right Left  ? Disc Pink and Sharp, Compact Pink and Sharp, mild temporal Peripapillary atrophy,  Compact, Tilted disc  ? C/D Ratio 0.3 0.2  ? Macula Flat, Blunted foveal reflex, scattered Drusen, RPE mottling and clumping, mild ERM, No heme or edema, stable from prior Flat, blunted foveal reflex, trace Epiretinal membrane superiorly, mild Retinal pigment epithelial mottling, Drusen  greatest superior macula  ? Vessels attenuated mild Vascular attenuation, mild Tortuousity  ? Periphery Attached, blonde peripheral fundus, pigmented CR scar at 0330, inferior pigmented pavingstone at 0600, operculated hole with surrounding laser scars at 1030 equator, scattered reticular degeneration, no new RT/RD Attached, blonde peripheral fundus, HST at 0230 with pigmented demarcation line - excellent laser surrounding, no SRF, pigmented peripheral cystoid degeneration, no new RT/RD  ? ?  ?  ? ?  ? ?Refraction   ? ? Wearing Rx   ? ?   Sphere Cylinder Axis Add  ? Right -1.50 +0.75 003 +2.25  ? Left -1.50 +0.75 142 +2.25  ? ?  ?  ? ? Manifest Refraction   ? ?   Sphere Cylinder Axis Dist VA  ? Right +0.05 +1.00 005 20/40+2  ? Left      ? ?  ?  ? ?  ? ? ?IMAGING AND PROCEDURES  ?Imaging and Procedures for @TODAY @ ? ?OCT, Retina - OU - Both Eyes   ? ?   ?Right Eye ?Quality was good. Central Foveal Thickness: 306. Progression has been stable. Findings include normal foveal contour, no IRF, no SRF, epiretinal membrane, retinal drusen (Focal ERM SN macula - stable from prior).  ? ?Left Eye ?Quality was good. Central Foveal Thickness: 305. Progression has been stable. Findings include normal foveal contour, no IRF, no SRF, epiretinal membrane, macular pucker, retinal drusen (ERM superior macula - stable).  ? ?Notes ?*Images captured and stored on drive ? ?Diagnosis / Impression:  ?NFP; no IRF/SRF OU ?Mild ERM OU ?Retinal drusen OU ? ?Clinical management:  ?See below ? ?Abbreviations: NFP - Normal foveal profile. CME - cystoid macular edema. PED - pigment epithelial detachment. IRF - intraretinal fluid. SRF - subretinal fluid. EZ -  ellipsoid zone. ERM - epiretinal membrane. ORA - outer retinal atrophy. ORT - outer retinal tubulation. SRHM - subretinal hyper-reflective material ? ? ?  ?  ?  ? ?  ?ASSESSMENT/PLAN: ? ?  ICD-10-CM   ?1. Retinal te

## 2022-02-08 ENCOUNTER — Other Ambulatory Visit: Payer: Self-pay

## 2022-02-08 ENCOUNTER — Encounter (INDEPENDENT_AMBULATORY_CARE_PROVIDER_SITE_OTHER): Payer: Self-pay | Admitting: Ophthalmology

## 2022-02-08 ENCOUNTER — Ambulatory Visit (INDEPENDENT_AMBULATORY_CARE_PROVIDER_SITE_OTHER): Payer: Medicare PPO | Admitting: Ophthalmology

## 2022-02-08 DIAGNOSIS — H35373 Puckering of macula, bilateral: Secondary | ICD-10-CM

## 2022-02-08 DIAGNOSIS — H25813 Combined forms of age-related cataract, bilateral: Secondary | ICD-10-CM | POA: Diagnosis not present

## 2022-02-08 DIAGNOSIS — H33312 Horseshoe tear of retina without detachment, left eye: Secondary | ICD-10-CM | POA: Diagnosis not present

## 2022-02-08 DIAGNOSIS — H532 Diplopia: Secondary | ICD-10-CM

## 2022-02-08 DIAGNOSIS — Z9889 Other specified postprocedural states: Secondary | ICD-10-CM

## 2022-02-08 DIAGNOSIS — H353131 Nonexudative age-related macular degeneration, bilateral, early dry stage: Secondary | ICD-10-CM

## 2022-03-30 ENCOUNTER — Other Ambulatory Visit (HOSPITAL_COMMUNITY): Payer: Self-pay | Admitting: Family Medicine

## 2022-03-30 DIAGNOSIS — R921 Mammographic calcification found on diagnostic imaging of breast: Secondary | ICD-10-CM

## 2022-04-07 DIAGNOSIS — L570 Actinic keratosis: Secondary | ICD-10-CM | POA: Diagnosis not present

## 2022-04-07 DIAGNOSIS — L820 Inflamed seborrheic keratosis: Secondary | ICD-10-CM | POA: Diagnosis not present

## 2022-04-07 DIAGNOSIS — X32XXXD Exposure to sunlight, subsequent encounter: Secondary | ICD-10-CM | POA: Diagnosis not present

## 2022-04-08 ENCOUNTER — Other Ambulatory Visit: Payer: Self-pay | Admitting: Family Medicine

## 2022-04-08 DIAGNOSIS — N8111 Cystocele, midline: Secondary | ICD-10-CM | POA: Diagnosis not present

## 2022-04-08 DIAGNOSIS — E039 Hypothyroidism, unspecified: Secondary | ICD-10-CM

## 2022-04-08 NOTE — Telephone Encounter (Signed)
Last seen 6 months ago.  Requested Prescriptions  Pending Prescriptions Disp Refills  . levothyroxine (SYNTHROID) 25 MCG tablet [Pharmacy Med Name: LEVOTHYROXINE SODIUM 25 MCG TAB] 90 tablet 1    Sig: TAKE ONE (1) TABLET BY MOUTH EVERY DAY     Endocrinology:  Hypothyroid Agents Failed - 04/08/2022  2:40 PM      Failed - Valid encounter within last 12 months    Recent Outpatient Visits          6 months ago Hypothyroidism, unspecified type   [place] Pickard, Cammie Mcgee, MD   1 year ago Bronchitis with acute wheezing   [place] Pickard, Cammie Mcgee, MD   1 year ago Allergic sinusitis   [place] [facility] Schaumann, Cammie Mcgee, MD   1 year ago Hypothyroidism, unspecified type   [place] [facility] Schaumann Cammie Mcgee, MD   1 year ago Acute non-recurrent maxillary sinusitis   [facility] [place] Baseman, Crystal A, FNP             Passed - TSH in normal range and within 360 days    TSH  Date Value Ref Range Status  10/05/2021 2.29 0.40 - 4.50 mIU/L Final

## 2022-04-20 ENCOUNTER — Ambulatory Visit (HOSPITAL_COMMUNITY)
Admission: RE | Admit: 2022-04-20 | Discharge: 2022-04-20 | Disposition: A | Discharge status: Home / Self Care | Payer: Medicare PPO | Source: Ambulatory Visit | Attending: Family Medicine | Admitting: Family Medicine

## 2022-04-20 DIAGNOSIS — R921 Mammographic calcification found on diagnostic imaging of breast: Secondary | ICD-10-CM | POA: Insufficient documentation

## 2022-04-20 DIAGNOSIS — R928 Other abnormal and inconclusive findings on diagnostic imaging of breast: Secondary | ICD-10-CM | POA: Diagnosis not present

## 2022-05-11 DIAGNOSIS — M25552 Pain in left hip: Secondary | ICD-10-CM | POA: Diagnosis not present

## 2022-06-07 ENCOUNTER — Other Ambulatory Visit: Payer: Self-pay | Admitting: Family Medicine

## 2022-06-07 DIAGNOSIS — K219 Gastro-esophageal reflux disease without esophagitis: Secondary | ICD-10-CM

## 2022-06-08 NOTE — Telephone Encounter (Signed)
Requested Prescriptions  Pending Prescriptions Disp Refills  . pantoprazole (PROTONIX) 40 MG tablet [Pharmacy Med Name: PANTOPRAZOLE SODIUM 40 MG DR TAB] 90 tablet 1    Sig: TAKE ONE TABLET (40MG  TOTAL) BY MOUTH DAILY     Gastroenterology: Proton Pump Inhibitors Failed - 06/07/2022 12:12 PM      Failed - Valid encounter within last 12 months    Recent Outpatient Visits          8 months ago Hypothyroidism, unspecified type   [facility], MD   1 year ago Bronchitis with acute wheezing   [facility] Pick[facility], [facility], MD   1 year ago Allergic sinusitis   [facility] Tanya Nones, MD   1 year ago Hypothyroidism, unspecified type   [facility], MD   1 year ago Acute non-recurrent maxillary sinusitis   [facility], FNP

## 2022-06-29 DIAGNOSIS — Z4689 Encounter for fitting and adjustment of other specified devices: Secondary | ICD-10-CM | POA: Diagnosis not present

## 2022-07-06 DIAGNOSIS — M545 Low back pain, unspecified: Secondary | ICD-10-CM | POA: Diagnosis not present

## 2022-07-13 DIAGNOSIS — M545 Low back pain, unspecified: Secondary | ICD-10-CM | POA: Diagnosis not present

## 2022-07-15 DIAGNOSIS — M545 Low back pain, unspecified: Secondary | ICD-10-CM | POA: Diagnosis not present

## 2022-07-15 DIAGNOSIS — M25552 Pain in left hip: Secondary | ICD-10-CM | POA: Diagnosis not present

## 2022-07-21 ENCOUNTER — Other Ambulatory Visit: Payer: Self-pay | Admitting: Family Medicine

## 2022-07-21 DIAGNOSIS — M810 Age-related osteoporosis without current pathological fracture: Secondary | ICD-10-CM

## 2022-07-21 NOTE — Telephone Encounter (Signed)
Requested Prescriptions  Pending Prescriptions Disp Refills  . meloxicam (MOBIC) 15 MG tablet [Pharmacy Med Name: MELOXICAM 15 MG TAB] 90 tablet 0    Sig: TAKE ONE TABLET (15 MG TOTAL) BY MOUTH DAILY.     Analgesics:  COX2 Inhibitors Failed - 07/21/2022 12:47 PM      Failed - Manual Review: Labs are only required if the patient has taken medication for more than 8 weeks.      Failed - Valid encounter within last 12 months    Recent Outpatient Visits          9 months ago Hypothyroidism, unspecified type   [place] Pickard, Cammie Mcgee, MD   1 year ago Bronchitis with acute wheezing   [place] Pickard, Cammie Mcgee, MD   1 year ago Allergic sinusitis   [place] [facility] Schaumann, Cammie Mcgee, MD   1 year ago Hypothyroidism, unspecified type   [place] Susy Frizzle, MD   2 years ago Acute non-recurrent maxillary sinusitis   [facility] [place] Baseman, Crystal A, FNP             Passed - HGB in normal range and within 360 days    Hemoglobin  Date Value Ref Range Status  10/05/2021 13.9 11.7 - 15.5 g/dL Final         Passed - Cr in normal range and within 360 days    Creat  Date Value Ref Range Status  10/05/2021 0.76 0.60 - 1.00 mg/dL Final         Passed - HCT in normal range and within 360 days    HCT  Date Value Ref Range Status  10/05/2021 41.0 35.0 - 45.0 % Final         Passed - AST in normal range and within 360 days    AST  Date Value Ref Range Status  10/05/2021 13 10 - 35 U/L Final         Passed - ALT in normal range and within 360 days    ALT  Date Value Ref Range Status  10/05/2021 13 6 - 29 U/L Final         Passed - eGFR is 30 or above and within 360 days    GFR, Est African American  Date Value Ref Range Status  09/25/2020 89 > OR = 60 mL/min/1.73m Final   GFR, Est Non African American  Date Value Ref Range Status  09/25/2020 77 > OR = 60 mL/min/1.733mFinal   eGFR   Date Value Ref Range Status  10/05/2021 83 > OR = 60 mL/min/1.7361minal    Comment:    The eGFR is based on the CKD-EPI 2021 equation. To calculate  the new eGFR from a previous Creatinine or Cystatin C result, go to https://www.kidney.org/professionals/ kdoqi/gfr%5Fcalculator          Passed - Patient is not pregnant

## 2022-08-31 ENCOUNTER — Encounter: Payer: Self-pay | Admitting: Family Medicine

## 2022-08-31 ENCOUNTER — Ambulatory Visit: Payer: Medicare PPO | Admitting: Family Medicine

## 2022-08-31 VITALS — BP 130/78 | HR 76 | Temp 98.3°F | Ht 62.0 in | Wt 144.0 lb

## 2022-08-31 DIAGNOSIS — R051 Acute cough: Secondary | ICD-10-CM

## 2022-08-31 DIAGNOSIS — J069 Acute upper respiratory infection, unspecified: Secondary | ICD-10-CM | POA: Diagnosis not present

## 2022-08-31 MED ORDER — HYDROCODONE BIT-HOMATROP MBR 5-1.5 MG/5ML PO SOLN: 5.0000 mL | Freq: Three times a day (TID) | ORAL | 0 refills | Status: DC | PRN

## 2022-08-31 NOTE — Progress Notes (Signed)
   Acute Office Visit  Subjective:     Patient ID: Gina Gibbs, female    DOB: 1946-07-07, 76 y.o.   MRN: [id number]  Chief Complaint  Patient presents with   Follow-up    poss respiratory infection and lingering cough (Neg COVID test 08/28/22)   Cough    Low grade Fever on Friday only, 101.7, Sat. 99.3 clear up afterwards    Patient presents with persistent cough, previous congestion, occasional wheezing, and low grade fever to 101.7 on Friday, worse in afternoons and at night, has tried small dose old hycodan without relief. She denies runny nose, shortness of breath, palpitations, nausea, vomiting, headache. Covid test negative on Friday. Her husband and grandkids have also been sick.    Review of Systems  Constitutional:  Positive for fever (low-grade on friday).  HENT:  Positive for congestion and sore throat. Negative for ear pain and sinus pain.   Eyes: Negative.   Respiratory:  Positive for cough, sputum production and wheezing. Negative for shortness of breath.   Cardiovascular: Negative.   Skin: Negative.         Objective:    BP 130/78   Pulse 76   Temp 98.3 F (36.8 C) (Oral)   Ht 5\' 2"  (1.575 m)   Wt 144 lb (65.3 kg)   SpO2 94%   BMI 26.34 kg/m    Physical Exam Vitals and nursing note reviewed.  Constitutional:      Appearance: Normal appearance.  HENT:     Right Ear: Tympanic membrane, ear canal and external ear normal.     Left Ear: Tympanic membrane, ear canal and external ear normal.     Nose: Nose normal.     Mouth/Throat:     Pharynx: Posterior oropharyngeal erythema present.  Eyes:     Conjunctiva/sclera: Conjunctivae normal.     Pupils: Pupils are equal, round, and reactive to light.  Cardiovascular:     Rate and Rhythm: Normal rate and regular rhythm.     Heart sounds: Murmur heard.  Pulmonary:     Effort: Pulmonary effort is normal.     Breath sounds: Normal breath sounds.  Musculoskeletal:     Cervical back: Normal range of  motion and neck supple.  Lymphadenopathy:     Cervical: No cervical adenopathy.  Neurological:     General: No focal deficit present.     Mental Status: She is alert and oriented to person, place, and time.  Psychiatric:        Mood and Affect: Mood normal.        Behavior: Behavior normal.        Thought Content: Thought content normal.        Judgment: Judgment normal.     No results found for any visits on 08/31/22.      Assessment & Plan:   Acute cough - Plan: SARS-CoV-2 RNA, Influenza A/B, and RSV RNA, Qualitative NAAT  Viral upper respiratory infection I believe Ms Lighty has a viral URI at this time. Given her symptoms I will treat her cough with Hycodan and continue symptomatic management at this time. If fever returns or symptoms worsen or persist for 10 days will treat for bacterial URI. Continue at home flonase and albuterol as needed, push fluids, and rest. Will call with lab results.   Return if symptoms worsen or fail to improve.  Rubie Maid, FNP

## 2022-08-31 NOTE — Patient Instructions (Signed)

## 2022-09-01 DIAGNOSIS — Z4689 Encounter for fitting and adjustment of other specified devices: Secondary | ICD-10-CM | POA: Diagnosis not present

## 2022-09-02 LAB — SARS-COV-2 RNA, INFLUENZA A/B, AND RSV RNA, QUALITATIVE NAAT
INFLUENZA A RNA: NOT DETECTED
INFLUENZA B RNA: NOT DETECTED
RSV RNA: NOT DETECTED
SARS COV2 RNA: NOT DETECTED

## 2022-09-07 ENCOUNTER — Encounter: Payer: Self-pay | Admitting: Family Medicine

## 2022-09-07 ENCOUNTER — Ambulatory Visit: Payer: Medicare PPO | Admitting: Family Medicine

## 2022-09-07 VITALS — BP 115/72 | HR 75 | Temp 97.5°F | Ht 62.0 in | Wt 145.0 lb

## 2022-09-07 DIAGNOSIS — Z1211 Encounter for screening for malignant neoplasm of colon: Secondary | ICD-10-CM

## 2022-09-07 DIAGNOSIS — R052 Subacute cough: Secondary | ICD-10-CM | POA: Diagnosis not present

## 2022-09-07 DIAGNOSIS — J019 Acute sinusitis, unspecified: Secondary | ICD-10-CM

## 2022-09-07 DIAGNOSIS — B9689 Other specified bacterial agents as the cause of diseases classified elsewhere: Secondary | ICD-10-CM | POA: Diagnosis not present

## 2022-09-07 MED ORDER — AMOXICILLIN-POT CLAVULANATE 875-125 MG PO TABS: 1.0000 | ORAL_TABLET | Freq: Two times a day (BID) | ORAL | 0 refills | Status: DC

## 2022-09-07 NOTE — Progress Notes (Signed)
   Acute Office Visit  Subjective:     Patient ID: Gina Gibbs, female    DOB: [birth year], 76 y.o.   MRN: [id number]  Chief Complaint  Patient presents with   Follow-up    lingering cough (sx since end of September)    Gina Gibbs presents today with persistent nighttime productive of yellow sputum cough, clear rhinorrhea for 11 days. Her husband and grandson are also sick with a cough for weeks. Endorses sinus pressure and headache. She denies fevers, shortness of breath, wheezing. She has tried Hycodan, Singulair, Flonase, Albuterol nightly without relief.   Review of Systems  Constitutional: Negative.   HENT:  Positive for sinus pain.   Respiratory:  Positive for cough and sputum production. Negative for shortness of breath and wheezing.   Neurological:  Positive for headaches.        Objective:    BP 115/72 Comment: recheck after 42mins  Pulse 75   Temp (!) 97.5 F (36.4 C) (Oral)   Ht 5\' 2"  (1.575 m)   Wt 145 lb (65.8 kg)   SpO2 95%   BMI 26.52 kg/m    Physical Exam Vitals and nursing note reviewed.  Constitutional:      Appearance: Normal appearance. She is normal weight.  HENT:     Right Ear: Tympanic membrane, ear canal and external ear normal.     Left Ear: Tympanic membrane, ear canal and external ear normal.     Nose: Nose normal.     Mouth/Throat:     Mouth: Mucous membranes are moist.     Pharynx: Posterior oropharyngeal erythema present.  Eyes:     Conjunctiva/sclera: Conjunctivae normal.     Pupils: Pupils are equal, round, and reactive to light.  Cardiovascular:     Rate and Rhythm: Normal rate and regular rhythm.     Pulses: Normal pulses.     Heart sounds: Normal heart sounds.  Pulmonary:     Effort: Pulmonary effort is normal.     Breath sounds: Normal breath sounds.  Skin:    General: Skin is warm and dry.  Neurological:     General: No focal deficit present.     Mental Status: She is alert.  Psychiatric:        Mood and Affect:  Mood normal.        Behavior: Behavior normal.        Thought Content: Thought content normal.        Judgment: Judgment normal.     No results found for any visits on 09/07/22.      Assessment & Plan:  1. Screening for malignant neoplasm of colon  - Cologuard  2. Subacute cough Continue Hycodan and Albuterol PRN  3. Acute bacterial rhinosinusitis Gina Gibbs has symptoms consistent with bacterial rhinosinusitis. Will treat with 10 days Augmentin. Continue symptomatic relief at home. Rest, push fluids.  - amoxicillin-clavulanate (AUGMENTIN) 875-125 MG tablet; Take 1 tablet by mouth 2 (two) times daily.  Dispense: 14 tablet; Refill: 0   Return if symptoms worsen or fail to improve.  Gina Maid, FNP

## 2022-09-15 ENCOUNTER — Other Ambulatory Visit: Payer: Self-pay | Admitting: Family Medicine

## 2022-09-20 ENCOUNTER — Other Ambulatory Visit (HOSPITAL_COMMUNITY): Payer: Self-pay | Admitting: Family Medicine

## 2022-09-20 DIAGNOSIS — R921 Mammographic calcification found on diagnostic imaging of breast: Secondary | ICD-10-CM

## 2022-09-20 DIAGNOSIS — R928 Other abnormal and inconclusive findings on diagnostic imaging of breast: Secondary | ICD-10-CM

## 2022-09-20 DIAGNOSIS — Z1231 Encounter for screening mammogram for malignant neoplasm of breast: Secondary | ICD-10-CM

## 2022-09-29 ENCOUNTER — Encounter (HOSPITAL_COMMUNITY): Payer: Self-pay

## 2022-09-29 ENCOUNTER — Ambulatory Visit (HOSPITAL_COMMUNITY)
Admission: RE | Admit: 2022-09-29 | Discharge: 2022-09-29 | Disposition: A | Discharge status: Home / Self Care | Payer: Medicare PPO | Source: Ambulatory Visit | Attending: Family Medicine | Admitting: Family Medicine

## 2022-09-29 DIAGNOSIS — R92323 Mammographic fibroglandular density, bilateral breasts: Secondary | ICD-10-CM | POA: Diagnosis not present

## 2022-09-29 DIAGNOSIS — R921 Mammographic calcification found on diagnostic imaging of breast: Secondary | ICD-10-CM

## 2022-10-11 ENCOUNTER — Other Ambulatory Visit: Payer: Self-pay | Admitting: Family Medicine

## 2022-10-11 DIAGNOSIS — J40 Bronchitis, not specified as acute or chronic: Secondary | ICD-10-CM

## 2022-10-19 ENCOUNTER — Other Ambulatory Visit: Payer: Self-pay | Admitting: Family Medicine

## 2022-10-19 ENCOUNTER — Ambulatory Visit (INDEPENDENT_AMBULATORY_CARE_PROVIDER_SITE_OTHER): Payer: Medicare PPO | Admitting: Family Medicine

## 2022-10-19 VITALS — BP 118/76 | HR 71 | Ht 62.0 in | Wt 144.8 lb

## 2022-10-19 DIAGNOSIS — Z13220 Encounter for screening for lipoid disorders: Secondary | ICD-10-CM

## 2022-10-19 DIAGNOSIS — Z23 Encounter for immunization: Secondary | ICD-10-CM

## 2022-10-19 DIAGNOSIS — M810 Age-related osteoporosis without current pathological fracture: Secondary | ICD-10-CM

## 2022-10-19 DIAGNOSIS — R000 Tachycardia, unspecified: Secondary | ICD-10-CM | POA: Diagnosis not present

## 2022-10-19 DIAGNOSIS — E039 Hypothyroidism, unspecified: Secondary | ICD-10-CM | POA: Diagnosis not present

## 2022-10-19 DIAGNOSIS — Z0000 Encounter for general adult medical examination without abnormal findings: Secondary | ICD-10-CM

## 2022-10-19 NOTE — Progress Notes (Signed)
Subjective:    Patient ID: Gina Gibbs, female    DOB: [birth year], 76 y.o.   MRN: [id number]  HPI Patient is a 76 year old Caucasian female here today for complete physical exam.  Her mammogram was performed earlier this year and was normal.  She does not require Pap smear due to age.  Her bone density was performed earlier this year and showed improvement compared to 2020.  She is overdue for colonoscopy.  She has Cologuard at home but she has not performed the test yet.  I encouraged her to do so.  She is due for COVID booster, RSV, as well as a flu shot.  Patient reports that she has been having frequent palpitations.  She states that her heart will start racing for no reason.  This will last for a minute to 2 minutes and then suddenly subside.  She denies any chest pain shortness of breath or dyspnea on exertion.  She has a history of PVCs due to mitral valve prolapse but the symptoms have never "lasted this long before".  She denies any syncope. Immunization History  Administered Date(s) Administered   Fluad Quad(high Dose 65+) 10/01/2019, 10/02/2020, 10/05/2021   Influenza-Unspecified 11/10/2016, 12/12/2017   Moderna Covid-19 Vaccine Bivalent Booster 19yrs & up 09/03/2021   Moderna Sars-Covid-2 Vaccination 01/05/2020, 02/03/2020, 10/09/2020   Pneumococcal Conjugate-13 05/22/2018   Pneumococcal Polysaccharide-23 09/20/2013, 11/10/2016   Tdap 05/01/2014, 12/01/2021   Unspecified SARS-COV-2 Vaccination 02/21/2020   Zoster Recombinat (Shingrix) 07/14/2018, 08/23/2019   Zoster, Live 03/13/2014    Past Medical History:  Diagnosis Date   Arthritis    Cataract    Mixed forms OU   Complication of anesthesia    GERD (gastroesophageal reflux disease) 02/28/2013   Hyperlipidemia 02/28/2013   Hypothyroid    Hypothyroid 09/26/2013   Hypothyroidism 03/31/2020   Macular degeneration    non-exu OU   Osteoporosis    PONV (postoperative nausea and vomiting)    Past Surgical History:   Procedure Laterality Date   ABDOMINAL HYSTERECTOMY     BREAST BIOPSY Right 10/09/2021   Benign Breast tissue with increased stromal fibrosis and adenosis with calcifications- No malignancy identfied.   EAR CYST EXCISION  02/23/2012   Procedure: CYST REMOVAL;  Surgeon: Wynonia Sours, MD;  Location: [facility];  Service: Orthopedics;  Laterality: Left;  debridement distal interphalangeal left index finger, excision cyst/loose body   FRACTURE SURGERY  09/2013   right ankle   spinal cyst removal     Current Outpatient Medications on File Prior to Visit  Medication Sig Dispense Refill   albuterol (VENTOLIN HFA) 108 (90 Base) MCG/ACT inhaler Inhale 2 puffs into the lungs every 6 (six) hours as needed for wheezing or shortness of breath. 8 g 0   fluticasone (FLONASE) 50 MCG/ACT nasal spray Place 2 sprays into both nostrils daily. 16 g 6   levothyroxine (SYNTHROID) 25 MCG tablet TAKE ONE (1) TABLET BY MOUTH EVERY DAY 90 tablet 1   meloxicam (MOBIC) 15 MG tablet TAKE ONE TABLET (15 MG TOTAL) BY MOUTH DAILY. 90 tablet 0   montelukast (SINGULAIR) 10 MG tablet TAKE ONE TABLET BY MOUTH EVERY NIGHT AT BEDTIME 30 tablet 3   nystatin cream (MYCOSTATIN) APPLY TO THE AFFECTED AREA(S) TWO TIMES A DAY     Omega-3 Fatty Acids (FISH OIL) 1000 MG CAPS Take 1,000 mg by mouth.     pantoprazole (PROTONIX) 40 MG tablet TAKE ONE TABLET (40MG  TOTAL) BY MOUTH DAILY 90 tablet 1  therapeutic multivitamin-minerals (THERAGRAN-M) tablet Take 1 tablet by mouth daily.     No current facility-administered medications on file prior to visit.   Allergies  Allergen Reactions   Tape     adhesives   Social History   Socioeconomic History   Marital status: Married    Spouse name: Not on file   Number of children: Not on file   Years of education: Not on file   Highest education level: Not on file  Occupational History   Occupation: retired  Tobacco Use   Smoking status: Never   Smokeless tobacco: Never   Vaping Use   Vaping Use: Never used  Substance and Sexual Activity   Alcohol use: Yes    Comment: rare   Drug use: No   Sexual activity: Never    Partners: Male    Birth control/protection: Surgical    Comment: hyst  Other Topics Concern   Not on file  Social History Narrative   Not on file   Social Determinants of Health   Financial Resource Strain: Not on file  Food Insecurity: Not on file  Transportation Needs: Not on file  Physical Activity: Not on file  Stress: Not on file  Social Connections: Not on file  Intimate Partner Violence: Not on file     Review of Systems  All other systems reviewed and are negative.      Objective:   Physical Exam Vitals reviewed.  Constitutional:      Appearance: Normal appearance.  Cardiovascular:     Rate and Rhythm: Normal rate and regular rhythm.     Heart sounds: Normal heart sounds. No murmur heard.    No friction rub. No gallop.  Pulmonary:     Effort: Pulmonary effort is normal. No respiratory distress.     Breath sounds: Normal breath sounds. No wheezing, rhonchi or rales.  Abdominal:     General: Bowel sounds are normal. There is no distension.     Palpations: Abdomen is soft.     Tenderness: There is no abdominal tenderness.  Musculoskeletal:     Right lower leg: No edema.     Left lower leg: No edema.  Skin:    General: Skin is warm.     Coloration: Skin is not jaundiced.     Findings: No rash.  Neurological:     General: No focal deficit present.     Mental Status: She is alert and oriented to person, place, and time.     Cranial Nerves: No cranial nerve deficit.     Sensory: No sensory deficit.     Motor: No weakness.     Coordination: Coordination normal.     Gait: Gait normal.            Assessment & Plan:  Racing heart beat - Plan: Ambulatory referral to Cardiology  Hypothyroidism, unspecified type - Plan: CBC with Differential/Platelet, Lipid panel, COMPLETE METABOLIC PANEL WITH GFR,  TSH  General medical examination - Plan: CBC with Differential/Platelet, Lipid panel, COMPLETE METABOLIC PANEL WITH GFR, TSH  Screening, lipid - Plan: CBC with Differential/Platelet, Lipid panel, COMPLETE METABOLIC PANEL WITH GFR I encouraged the patient to perform her Cologuard.  Check CBC CMP and a lipid panel.  Given her history of hypothyroidism I will also check a TSH to ensure adequate dosage of levothyroxine.  Given her recent palpitations, I would like to consult cardiology to have the patient scheduled for a monitor to evaluate for any SVT or atrial fibrillation as  her symptoms do not sound like benign PVCs.  Recommended a COVID booster shot.  Recommended a flu shot.  Shingles vaccine and pneumonia vaccine are up-to-date

## 2022-10-19 NOTE — Addendum Note (Signed)
Addended by: Venia Carbon K on: 10/19/2022 04:37 PM   Modules accepted: Orders

## 2022-10-20 DIAGNOSIS — Z1211 Encounter for screening for malignant neoplasm of colon: Secondary | ICD-10-CM | POA: Diagnosis not present

## 2022-10-20 LAB — CBC WITH DIFFERENTIAL/PLATELET
Absolute Monocytes: 540 cells/uL (ref 200–950)
Basophils Absolute: 29 cells/uL (ref 0–200)
Basophils Relative: 0.4 %
Eosinophils Absolute: 329 cells/uL (ref 15–500)
Eosinophils Relative: 4.5 %
HCT: 39.8 % (ref 35.0–45.0)
Hemoglobin: 13.5 g/dL (ref 11.7–15.5)
Lymphs Abs: 2234 cells/uL (ref 850–3900)
MCH: 31.5 pg (ref 27.0–33.0)
MCHC: 33.9 g/dL (ref 32.0–36.0)
MCV: 93 fL (ref 80.0–100.0)
MPV: 10.2 fL (ref 7.5–12.5)
Monocytes Relative: 7.4 %
Neutro Abs: 4168 cells/uL (ref 1500–7800)
Neutrophils Relative %: 57.1 %
Platelets: 302 10*3/uL (ref 140–400)
RBC: 4.28 10*6/uL (ref 3.80–5.10)
RDW: 12.1 % (ref 11.0–15.0)
Total Lymphocyte: 30.6 %
WBC: 7.3 10*3/uL (ref 3.8–10.8)

## 2022-10-20 LAB — COMPLETE METABOLIC PANEL WITH GFR
AG Ratio: 1.9 (calc) (ref 1.0–2.5)
ALT: 12 U/L (ref 6–29)
AST: 13 U/L (ref 10–35)
Albumin: 4 g/dL (ref 3.6–5.1)
Alkaline phosphatase (APISO): 63 U/L (ref 37–153)
BUN: 11 mg/dL (ref 7–25)
CO2: 26 mmol/L (ref 20–32)
Calcium: 9 mg/dL (ref 8.6–10.4)
Chloride: 109 mmol/L (ref 98–110)
Creat: 0.64 mg/dL (ref 0.60–1.00)
Globulin: 2.1 g/dL (calc) (ref 1.9–3.7)
Glucose, Bld: 95 mg/dL (ref 65–99)
Potassium: 4.4 mmol/L (ref 3.5–5.3)
Sodium: 144 mmol/L (ref 135–146)
Total Bilirubin: 1.2 mg/dL (ref 0.2–1.2)
Total Protein: 6.1 g/dL (ref 6.1–8.1)
eGFR: 93 mL/min/{1.73_m2} (ref >=60)

## 2022-10-20 LAB — LIPID PANEL
Cholesterol: 201 mg/dL — ABNORMAL HIGH (ref <200)
HDL: 53 mg/dL (ref >=50)
LDL Cholesterol (Calc): 122 mg/dL (calc) — ABNORMAL HIGH
Non-HDL Cholesterol (Calc): 148 mg/dL (calc) — ABNORMAL HIGH (ref <130)
Total CHOL/HDL Ratio: 3.8 (calc) (ref <5.0)
Triglycerides: 138 mg/dL (ref <150)

## 2022-10-20 LAB — TSH: TSH: 3.08 mIU/L (ref 0.40–4.50)

## 2022-10-20 NOTE — Telephone Encounter (Signed)
Patient last OV 10/19/22 will refill.  Requested Prescriptions  Pending Prescriptions Disp Refills   meloxicam (MOBIC) 15 MG tablet [Pharmacy Med Name: MELOXICAM 15 MG TAB] 90 tablet 0    Sig: TAKE ONE TABLET (15 MG TOTAL) BY MOUTH DAILY.     Analgesics:  COX2 Inhibitors Failed - 10/19/2022  4:00 PM      Failed - Manual Review: Labs are only required if the patient has taken medication for more than 8 weeks.      Failed - HGB in normal range and within 360 days    Hemoglobin  Date Value Ref Range Status  10/19/2022 13.5 11.7 - 15.5 g/dL Final         Failed - Cr in normal range and within 360 days    Creat  Date Value Ref Range Status  10/19/2022 0.64 0.60 - 1.00 mg/dL Final         Failed - HCT in normal range and within 360 days    HCT  Date Value Ref Range Status  10/19/2022 39.8 35.0 - 45.0 % Final         Failed - AST in normal range and within 360 days    AST  Date Value Ref Range Status  10/19/2022 13 10 - 35 U/L Final         Failed - ALT in normal range and within 360 days    ALT  Date Value Ref Range Status  10/19/2022 12 6 - 29 U/L Final         Failed - eGFR is 30 or above and within 360 days    GFR, Est African American  Date Value Ref Range Status  09/25/2020 89 > OR = 60 mL/min/1.84m Final   GFR, Est Non African American  Date Value Ref Range Status  09/25/2020 77 > OR = 60 mL/min/1.781mFinal   eGFR  Date Value Ref Range Status  10/19/2022 93 > OR = 60 mL/min/1.7383minal         Failed - Valid encounter within last 12 months    Recent Outpatient Visits           1 year ago Hypothyroidism, unspecified type   [facility]   1 year ago Bronchitis with acute wheezing   [place], War[place]ammie McgeeD   1 year ago Allergic sinusitis   [facility] [facility] Mcge[facility]   2 years ago Hypothyroidism, unspecified type   [facility]    2 years ago Acute non-recurrent maxillary sinusitis   [facility]       Future Appointments             In 1 year Pickard, WarCammie McgeeD [facility]uttonPatient is not pregnant       levothyroxine (SYNTHROID) 25 MCG tablet [Pharmacy Med Name: LEVOTHYROXINE SODIUM 25 MCG TAB] 90 tablet 0    Sig: TAKE ONE (1) TABLET BY MOUTH EVERY DAY     Endocrinology:  Hypothyroid Agents Failed - 10/19/2022  4:00 PM      Failed - TSH in normal range and within 360 days    TSH  Date Value Ref Range Status  10/19/2022 3.08 0.40 - 4.50 mIU/L Final  Failed - Valid encounter within last 12 months    Recent Outpatient Visits           1 year ago Hypothyroidism, unspecified type   [place] Pickard, Cammie Mcgee, MD   1 year ago Bronchitis with acute wheezing   [place] Pickard, Cammie Mcgee, MD   1 year ago Allergic sinusitis   [place] Dennard Schaumann Cammie Mcgee, MD   2 years ago Hypothyroidism, unspecified type   [place] Susy Frizzle, MD   2 years ago Acute non-recurrent maxillary sinusitis   [place], [place], FNP       Future Appointments             In 1 year Pickard, Cammie Mcgee, MD [place]

## 2022-10-28 LAB — COLOGUARD: COLOGUARD: NEGATIVE

## 2022-11-02 DIAGNOSIS — Z4689 Encounter for fitting and adjustment of other specified devices: Secondary | ICD-10-CM | POA: Diagnosis not present

## 2022-12-09 ENCOUNTER — Ambulatory Visit (INDEPENDENT_AMBULATORY_CARE_PROVIDER_SITE_OTHER): Payer: Medicare PPO

## 2022-12-09 ENCOUNTER — Ambulatory Visit: Payer: Medicare PPO | Attending: Internal Medicine | Admitting: Internal Medicine

## 2022-12-09 ENCOUNTER — Encounter: Payer: Self-pay | Admitting: Internal Medicine

## 2022-12-09 VITALS — BP 157/87 | HR 66 | Ht 62.0 in | Wt 145.0 lb

## 2022-12-09 DIAGNOSIS — Z8679 Personal history of other diseases of the circulatory system: Secondary | ICD-10-CM | POA: Diagnosis not present

## 2022-12-09 DIAGNOSIS — Z136 Encounter for screening for cardiovascular disorders: Secondary | ICD-10-CM | POA: Diagnosis not present

## 2022-12-09 DIAGNOSIS — R000 Tachycardia, unspecified: Secondary | ICD-10-CM | POA: Diagnosis not present

## 2022-12-09 DIAGNOSIS — R002 Palpitations: Secondary | ICD-10-CM

## 2022-12-09 NOTE — Patient Instructions (Signed)
Medication Instructions:  Your physician recommends that you continue on your current medications as directed. Please refer to the Current Medication list given to you today.   Labwork: None  Testing/Procedures: None  Follow-Up: Follow up with Dr. Mallipeddi in 1 year.   Any Other Special Instructions Will Be Listed Below (If Applicable).     If you need a refill on your cardiac medications before your next appointment, please call your pharmacy.    ZIO XT- Long Term Monitor Instructions   Your physician has requested you wear your ZIO patch monitor___14____days.   This is a single patch monitor.  [employer] supplies one patch monitor per enrollment.  Additional stickers are not available.   Please do not apply patch if you will be having a Nuclear Stress Test, Echocardiogram, Cardiac CT, MRI, or Chest Xray during the time frame you would be wearing the monitor. The patch cannot be worn during these tests.  You cannot remove and re-apply the ZIO XT patch monitor.   Your ZIO patch monitor will be sent USPS Priority mail from [employer] directly to your home address. The monitor may also be mailed to a PO BOX if home delivery is not available.   It may take 3-5 days to receive your monitor after you have been enrolled.   Once you have received you monitor, please review enclosed instructions.  Your monitor has already been registered assigning a specific monitor serial # to you.   Applying the monitor   Shave hair from upper left chest.   Hold abrader disc by orange tab.  Rub abrader in 40 strokes over left upper chest as indicated in your monitor instructions.   Clean area with 4 enclosed alcohol pads .  Use all pads to assure are is cleaned thoroughly.  Let dry.   Apply patch as indicated in monitor instructions.  Patch will be place under collarbone on left side of chest with arrow pointing upward.   Rub patch adhesive wings for 2 minutes.Remove white label marked  "1".  Remove white label marked "2".  Rub patch adhesive wings for 2 additional minutes.   While looking in a mirror, press and release button in center of patch.  A small green light will flash 3-4 times .  This will be your only indicator the monitor has been turned on.     Do not shower for the first 24 hours.  You may shower after the first 24 hours.   Press button if you feel a symptom. You will hear a small click.  Record Date, Time and Symptom in the Patient Log Book.   When you are ready to remove patch, follow instructions on last 2 pages of Patient Log Book.  Stick patch monitor onto last page of Patient Log Book.   Place Patient Log Book in Blue box.  Use locking tab on box and tape box closed securely.  The Orange and White box has prepaid postage on it.  Please place in mailbox as soon as possible.  Your physician should have your test results approximately 7 days after the monitor has been mailed back to [employer].   Call [employer] Customer Care at [phone] if you have questions regarding your ZIO XT patch monitor.  Call them immediately if you see an orange light blinking on your monitor.   If your monitor falls off in less than 4 days contact our Monitor department at [phone].  If your monitor becomes loose or falls off after   days call [employer] at [phone] for suggestions on securing your monitor.

## 2022-12-09 NOTE — Progress Notes (Addendum)
Cardiology Office Note  Date: 12/09/2022   ID: Gina Gibbs 1945-12-13, MRN [id number]  PCP:  Gina Frizzle, MD  Cardiologist:  None Electrophysiologist:  None   Reason for Office Visit: Evaluation of palpitations at the request of Dr. Cindi Gibbs   History of Present Illness: Gina Gibbs is a 77 y.o. female known to have hypothyroidism was referred to cardiology clinic for evaluation of palpitations.  Patient has been having palpitations since 11/23 almost every day and easing down in 11/2022. Palpitations lasted for 10 minutes and not associated with dizziness/ presyncope/syncope. She did have 1 episode of dizziness in 12/23 when she was almost about to pass out but that was only episode and had no recurrence.  She had similar episodes of palpitations (but those were skipped beats) back in 1970s when she was diagnosed with mitral valve prolapse and was placed on beta-blockers with resolution of symptoms. She was eventually taken off beta-blockers with no recurrence.  Denies any angina, DOE, leg swelling. She can perform all her daily activities including household chores at home with no symptoms. Denies smoking cigarettes, alcohol use and illicit drug abuse.  Past Medical History:  Diagnosis Date   Arthritis    Cataract    Mixed forms OU   Complication of anesthesia    GERD (gastroesophageal reflux disease) 02/28/2013   Hyperlipidemia 02/28/2013   Hypothyroid    Hypothyroid 09/26/2013   Hypothyroidism 03/31/2020   Macular degeneration    non-exu OU   Osteoporosis    PONV (postoperative nausea and vomiting)     Past Surgical History:  Procedure Laterality Date   ABDOMINAL HYSTERECTOMY     BREAST BIOPSY Right 10/09/2021   Benign Breast tissue with increased stromal fibrosis and adenosis with calcifications- No malignancy identfied.   EAR CYST EXCISION  02/23/2012   Procedure: CYST REMOVAL;  Surgeon: Gina Sours, MD;  Location: [facility];  Service:  Orthopedics;  Laterality: Left;  debridement distal interphalangeal left index finger, excision cyst/loose body   FRACTURE SURGERY  09/2013   right ankle   spinal cyst removal      Current Outpatient Medications  Medication Sig Dispense Refill   albuterol (VENTOLIN HFA) 108 (90 Base) MCG/ACT inhaler Inhale 2 puffs into the lungs every 6 (six) hours as needed for wheezing or shortness of breath. 8 g 0   fluticasone (FLONASE) 50 MCG/ACT nasal spray Place 2 sprays into both nostrils daily. 16 g 6   levothyroxine (SYNTHROID) 25 MCG tablet TAKE ONE (1) TABLET BY MOUTH EVERY DAY 90 tablet 0   meloxicam (MOBIC) 15 MG tablet TAKE ONE TABLET (15 MG TOTAL) BY MOUTH DAILY. 90 tablet 0   montelukast (SINGULAIR) 10 MG tablet TAKE ONE TABLET BY MOUTH EVERY NIGHT AT BEDTIME 30 tablet 3   nystatin cream (MYCOSTATIN) APPLY TO THE AFFECTED AREA(S) TWO TIMES A DAY     Omega-3 Fatty Acids (FISH OIL) 1000 MG CAPS Take 1,000 mg by mouth.     pantoprazole (PROTONIX) 40 MG tablet TAKE ONE TABLET (40MG  TOTAL) BY MOUTH DAILY 90 tablet 1   therapeutic multivitamin-minerals (THERAGRAN-M) tablet Take 1 tablet by mouth daily.     No current facility-administered medications for this visit.   Allergies:  Tape   Social History: The patient  reports that she has never smoked. She has never used smokeless tobacco. She reports current alcohol use. She reports that she does not use drugs.   Family History: The patient's family history includes  Diabetes in her mother.   ROS:  Please see the history of present illness. Otherwise, complete review of systems is positive for none.  All other systems are reviewed and negative.   Physical Exam: VS:  BP (!) 157/87   Pulse 66   Ht 5\' 2"  (1.575 m)   Wt 145 lb (65.8 kg)   SpO2 95%   BMI 26.52 kg/m , BMI Body mass index is 26.52 kg/m.  Wt Readings from Last 3 Encounters:  12/09/22 145 lb (65.8 kg)  10/19/22 144 lb 12.8 oz (65.7 kg)  09/07/22 145 lb (65.8 kg)    General:  Patient appears comfortable at rest. HEENT: Conjunctiva and lids normal, oropharynx clear with moist mucosa. Neck: Supple, no elevated JVP or carotid bruits, no thyromegaly. Lungs: Clear to auscultation, nonlabored breathing at rest. Cardiac: Regular rate and rhythm, no S3 or significant systolic murmur, no pericardial rub. Abdomen: Soft, nontender, no hepatomegaly, bowel sounds present, no guarding or rebound. Extremities: No pitting edema, distal pulses 2+. Skin: Warm and dry. Musculoskeletal: No kyphosis. Neuropsychiatric: Alert and oriented x3, affect grossly appropriate.  ECG:  An ECG dated 12/09/2022 was personally reviewed today and demonstrated:  Normal sinus rhythm and no ST-T changes  Recent Labwork: 10/19/2022: ALT 12; AST 13; BUN 11; Creat 0.64; Hemoglobin 13.5; Platelets 302; Potassium 4.4; Sodium 144; TSH 3.08     Component Value Date/Time   CHOL 201 (H) 10/19/2022 1003   TRIG 138 10/19/2022 1003   HDL 53 10/19/2022 1003   CHOLHDL 3.8 10/19/2022 1003   VLDL 30 06/06/2019 0920   LDLCALC 122 (H) 10/19/2022 1003    Other Studies Reviewed Today:   Assessment and Plan: Patient is a 77 year old F known to have hypothyroidism was referred to cardiology clinic for evaluation of palpitations at the request of Dr. Dennard Gibbs.  # Palpitations -Patient has been having palpitations lasting for 10 minutes since 09/2022 almost every day and is down since 11/2022 without any intervention. No stressors in family/work. Does not take coffee, energy drinks. I will obtain 2-week event monitor to rule out arrhythmias.  If the event monitor is unremarkable, I instructed patient to obtain a smart watch with EKG lead and monitor for any palpitations in the future.  # Screening for CAD -I discussed the benefit of CT calcium scoring for CAD screening and patient can get it when she makes up her mind.  # Self-reported history of mitral valve prolapse -Patient says she was diagnosed with mitral  valve prolapse by her heart doctor back in 1970s and was placed on beta-blocker for palpitations. Physical examination did not reveal any evidence of holosystolic murmur. Will defer echocardiogram for now.  I have spent a total of 45 minutes with patient reviewing chart, EKGs, labs and examining patient as well as establishing an assessment and plan that was discussed with the patient.  > 50% of time was spent in direct patient care.     Medication Adjustments/Labs and Tests Ordered: Current medicines are reviewed at length with the patient today.  Concerns regarding medicines are outlined above.   Tests Ordered: Orders Placed This Encounter  Procedures   EKG 12-Lead    Medication Changes: No orders of the defined types were placed in this encounter.   Disposition:  Follow up  one year  Signed, Kaleesi Guyton Fidel Levy, MD, 12/09/2022 8:52 AM    [facility] [address], [place],  [address]

## 2022-12-10 ENCOUNTER — Other Ambulatory Visit: Payer: Self-pay | Admitting: Family Medicine

## 2022-12-10 DIAGNOSIS — K219 Gastro-esophageal reflux disease without esophagitis: Secondary | ICD-10-CM

## 2022-12-21 DIAGNOSIS — Z6826 Body mass index (BMI) 26.0-26.9, adult: Secondary | ICD-10-CM | POA: Diagnosis not present

## 2022-12-21 DIAGNOSIS — Z01419 Encounter for gynecological examination (general) (routine) without abnormal findings: Secondary | ICD-10-CM | POA: Diagnosis not present

## 2022-12-21 DIAGNOSIS — N8111 Cystocele, midline: Secondary | ICD-10-CM | POA: Diagnosis not present

## 2022-12-23 DIAGNOSIS — H04123 Dry eye syndrome of bilateral lacrimal glands: Secondary | ICD-10-CM | POA: Diagnosis not present

## 2022-12-23 DIAGNOSIS — H0288B Meibomian gland dysfunction left eye, upper and lower eyelids: Secondary | ICD-10-CM | POA: Diagnosis not present

## 2022-12-23 DIAGNOSIS — H43393 Other vitreous opacities, bilateral: Secondary | ICD-10-CM | POA: Diagnosis not present

## 2022-12-23 DIAGNOSIS — H0288A Meibomian gland dysfunction right eye, upper and lower eyelids: Secondary | ICD-10-CM | POA: Diagnosis not present

## 2022-12-23 DIAGNOSIS — H35372 Puckering of macula, left eye: Secondary | ICD-10-CM | POA: Diagnosis not present

## 2022-12-23 DIAGNOSIS — H2513 Age-related nuclear cataract, bilateral: Secondary | ICD-10-CM | POA: Diagnosis not present

## 2022-12-29 DIAGNOSIS — R002 Palpitations: Secondary | ICD-10-CM | POA: Diagnosis not present

## 2023-01-18 ENCOUNTER — Other Ambulatory Visit: Payer: Self-pay | Admitting: Family Medicine

## 2023-01-18 DIAGNOSIS — M810 Age-related osteoporosis without current pathological fracture: Secondary | ICD-10-CM

## 2023-01-18 NOTE — Telephone Encounter (Signed)
Prescription Request  01/18/2023  Is this a "Controlled Substance" medicine? No  LOV: 10/19/2022  What is the name of the medication or equipment? meloxicam (MOBIC) 15 MG tablet   Have you contacted your pharmacy to request a refill? Yes   Which pharmacy would you like this sent to?  [facility], [place] [address] Phone: [phone] Fax: [phone]    Patient notified that their request is being sent to the clinical staff for review and that they should receive a response within 2 business days.   Please advise at [place]

## 2023-01-19 MED ORDER — MELOXICAM 15 MG PO TABS: ORAL_TABLET | ORAL | 0 refills | Status: DC

## 2023-01-20 ENCOUNTER — Other Ambulatory Visit: Payer: Self-pay

## 2023-01-20 DIAGNOSIS — E039 Hypothyroidism, unspecified: Secondary | ICD-10-CM

## 2023-01-20 MED ORDER — LEVOTHYROXINE SODIUM 25 MCG PO TABS: ORAL_TABLET | ORAL | 1 refills | Status: DC

## 2023-02-01 ENCOUNTER — Other Ambulatory Visit: Payer: Self-pay | Admitting: Family Medicine

## 2023-02-01 ENCOUNTER — Other Ambulatory Visit: Payer: Self-pay

## 2023-02-01 DIAGNOSIS — K219 Gastro-esophageal reflux disease without esophagitis: Secondary | ICD-10-CM

## 2023-02-01 DIAGNOSIS — J40 Bronchitis, not specified as acute or chronic: Secondary | ICD-10-CM

## 2023-02-01 MED ORDER — PANTOPRAZOLE SODIUM 40 MG PO TBEC: DELAYED_RELEASE_TABLET | ORAL | 2 refills | Status: DC

## 2023-02-09 ENCOUNTER — Encounter (INDEPENDENT_AMBULATORY_CARE_PROVIDER_SITE_OTHER): Payer: Medicare PPO | Admitting: Ophthalmology

## 2023-02-09 DIAGNOSIS — H353131 Nonexudative age-related macular degeneration, bilateral, early dry stage: Secondary | ICD-10-CM

## 2023-02-09 DIAGNOSIS — H35373 Puckering of macula, bilateral: Secondary | ICD-10-CM

## 2023-02-09 DIAGNOSIS — H25813 Combined forms of age-related cataract, bilateral: Secondary | ICD-10-CM

## 2023-02-09 DIAGNOSIS — Z9889 Other specified postprocedural states: Secondary | ICD-10-CM

## 2023-02-09 DIAGNOSIS — H532 Diplopia: Secondary | ICD-10-CM

## 2023-02-09 DIAGNOSIS — H33312 Horseshoe tear of retina without detachment, left eye: Secondary | ICD-10-CM

## 2023-02-11 ENCOUNTER — Other Ambulatory Visit: Payer: Self-pay | Admitting: Family Medicine

## 2023-02-14 NOTE — Telephone Encounter (Signed)
Last OV 10/19/22, will refill.  Requested Prescriptions  Pending Prescriptions Disp Refills   fluticasone (FLONASE) 50 MCG/ACT nasal spray [Pharmacy Med Name: FLUTICASONE PROPIONATE 50 MCG/ACT N] 16 g 0    Sig: USE TWO SPRAYS IN BOTH NOSTRILS DAILY     Ear, Nose, and Throat: Nasal Preparations - Corticosteroids Failed - 02/11/2023  5:26 PM      Failed - Valid encounter within last 12 months    Recent Outpatient Visits           1 year ago Hypothyroidism, unspecified type   [place] Susy Frizzle, MD   1 year ago Bronchitis with acute wheezing   [place] Pickard, Cammie Mcgee, MD   1 year ago Allergic sinusitis   [place] Susy Frizzle, MD   2 years ago Hypothyroidism, unspecified type   [place] Susy Frizzle, MD   2 years ago Acute non-recurrent maxillary sinusitis   [place], [place], FNP       Future Appointments             In 8 months Pickard, Cammie Mcgee, MD [place]

## 2023-02-15 NOTE — Progress Notes (Signed)
[facility] - Clinic Note  03/01/2023     CHIEF COMPLAINT Patient presents for Retina Follow Up   HISTORY OF PRESENT ILLNESS: Gina Gibbs is a 77 y.o. female who presents to the clinic today for:   HPI     Retina Follow Up   Patient presents with  Other.  In both eyes.  This started 1 year ago.  I, the attending physician,  performed the HPI with the patient and updated documentation appropriately.        Comments   Patient here for 1 year retina follow up for ERM OU. Patient  states vision doing good. No eye pain. Has new glasses.      Last edited by Rennis ChrisZamora, Jonise Weightman, MD on 03/02/2023 12:11 PM.    pt saw Dr. Dione BoozeGroat a few weeks ago   Referring physician: Sallye LatGroat, Christopher, MD [address] [place] [id number]  HISTORICAL INFORMATION:   Selected notes from the MEDICAL RECORD NUMBER Referred by Dr. Daisy LazarMark Cotter for retinal hole OD LEE: 02.06.20 Judie Petit(M. Cotter) [BCVA: 20/20 OU] Ocular Hx-cellophane OD, ERM OD, PVD OD, retinal hole OD PMH-arthritis    CURRENT MEDICATIONS: No current outpatient medications on file. (Ophthalmic Drugs)   No current facility-administered medications for this visit. (Ophthalmic Drugs)   Current Outpatient Medications (Other)  Medication Sig   albuterol (VENTOLIN HFA) 108 (90 Base) MCG/ACT inhaler Inhale 2 puffs into the lungs every 6 (six) hours as needed for wheezing or shortness of breath.   fluticasone (FLONASE) 50 MCG/ACT nasal spray USE TWO SPRAYS IN BOTH NOSTRILS DAILY   levothyroxine (SYNTHROID) 25 MCG tablet TAKE ONE (1) TABLET BY MOUTH EVERY DAY   meloxicam (MOBIC) 15 MG tablet TAKE ONE TABLET (15 MG TOTAL) BY MOUTH DAILY.   montelukast (SINGULAIR) 10 MG tablet TAKE ONE TABLET BY MOUTH EVERY NIGHT AT BEDTIME   nystatin cream (MYCOSTATIN) APPLY TO THE AFFECTED AREA(S) TWO TIMES A DAY   Omega-3 Fatty Acids (FISH OIL) 1000 MG CAPS Take 1,000 mg by mouth.   pantoprazole (PROTONIX) 40 MG tablet TAKE ONE  TABLET (40MG  TOTAL) BY MOUTH DAILY   therapeutic multivitamin-minerals (THERAGRAN-M) tablet Take 1 tablet by mouth daily.   No current facility-administered medications for this visit. (Other)   REVIEW OF SYSTEMS: ROS   Positive for: Gastrointestinal, Musculoskeletal, Endocrine, Eyes Negative for: Constitutional, Neurological, Skin, Genitourinary, HENT, Cardiovascular, Respiratory, Psychiatric, Allergic/Imm, Heme/Lymph Last edited by Laddie Aquaslarke, Rebecca S, COA on 03/01/2023 12:48 PM.      ALLERGIES Allergies  Allergen Reactions   Tape     adhesives   PAST MEDICAL HISTORY Past Medical History:  Diagnosis Date   Arthritis    Cataract    Mixed forms OU   Complication of anesthesia    GERD (gastroesophageal reflux disease) 02/28/2013   Hyperlipidemia 02/28/2013   Hypothyroid    Hypothyroid 09/26/2013   Hypothyroidism 03/31/2020   Macular degeneration    non-exu OU   Osteoporosis    PONV (postoperative nausea and vomiting)    Past Surgical History:  Procedure Laterality Date   ABDOMINAL HYSTERECTOMY     BREAST BIOPSY Right 10/09/2021   Benign Breast tissue with increased stromal fibrosis and adenosis with calcifications- No malignancy identfied.   EAR CYST EXCISION  02/23/2012   Procedure: CYST REMOVAL;  Surgeon: Nicki ReaperGary R Kuzma, MD;  Location: [facility];  Service: Orthopedics;  Laterality: Left;  debridement distal interphalangeal left index finger, excision cyst/loose body  FRACTURE SURGERY  09/2013   right ankle   spinal cyst removal     FAMILY HISTORY Family History  Problem Relation Age of Onset   Diabetes Mother    SOCIAL HISTORY Social History   Tobacco Use   Smoking status: Never   Smokeless tobacco: Never  Vaping Use   Vaping Use: Never used  Substance Use Topics   Alcohol use: Yes    Comment: rare   Drug use: No       OPHTHALMIC EXAM:  Base Eye Exam     Visual Acuity (Snellen - Linear)       Right Left   Dist cc 20/40 +1 20/20    Dist ph cc 20/30 +1     Correction: Glasses         Tonometry (Tonopen, 12:45 PM)       Right Left   Pressure 18 17         Pupils       Dark Light Shape React APD   Right 3 2 Round Brisk None   Left 3 2 Round Brisk None         Visual Fields (Counting fingers)       Left Right    Full Full         Extraocular Movement       Right Left    Full, Ortho Full, Ortho         Neuro/Psych     Oriented x3: Yes   Mood/Affect: Normal         Dilation     Both eyes: 1.0% Mydriacyl, 2.5% Phenylephrine @ 12:45 PM           Slit Lamp and Fundus Exam     Slit Lamp Exam       Right Left   Lids/Lashes Dermatochalasis - upper lid, mild Meibomian gland dysfunction Dermatochalasis - upper lid, mild Meibomian gland dysfunction   Conjunctiva/Sclera White and quiet White and quiet   Cornea 1+ fine Punctate epithelial erosions, trace tear film debris trace tear film debris   Anterior Chamber deep, narrow temporal angle deep, narrow temporal angle   Iris Round and dilated, mild anterior bowing Round and dilated, mild anterior bowing   Lens 2-3+ Nuclear sclerosis with early brunescence, 2-3+ Cortical cataract 2-3+ Nuclear sclerosis with early brunescence, 2-3+ Cortical cataract   Anterior Vitreous Vitreous syneresis, Posterior vitreous detachment mild syneresis, Posterior vitreous detachment         Fundus Exam       Right Left   Disc Pink and Sharp, Compact Pink and Sharp, mild temporal Peripapillary atrophy, Compact, Tilted disc   C/D Ratio 0.3 0.2   Macula Flat, Blunted foveal reflex, scattered Drusen, RPE mottling and clumping, mild ERM, No heme or edema, stable from prior Flat, blunted foveal reflex, trace Epiretinal membrane superiorly, mild Retinal pigment epithelial mottling, Drusen greatest superior macula   Vessels attenuated, mild tortuosity attenuated, mild tortuosity   Periphery Attached, blonde peripheral fundus, pigmented CR scar at 0330, inferior  pigmented pavingstone at 0600, operculated hole with surrounding laser scars at 1030 equator, scattered reticular degeneration, no new RT/RD Attached, blonde peripheral fundus, HST at 0230 with pigmented demarcation line - excellent laser surrounding, no SRF, pigmented peripheral cystoid degeneration, no new RT/RD           Refraction     Wearing Rx       Sphere Cylinder Axis Add   Right +0.75 +0.75 015 +2.25  Left +2.00 +0.50 141 +2.25            IMAGING AND PROCEDURES  Imaging and Procedures for @TODAY @  OCT, Retina - OU - Both Eyes       Right Eye Quality was good. Central Foveal Thickness: 309. Progression has been stable. Findings include normal foveal contour, no IRF, no SRF, retinal drusen , epiretinal membrane (Mild focal ERM SN macula - stable from prior).   Left Eye Quality was good. Central Foveal Thickness: 304. Progression has been stable. Findings include normal foveal contour, no IRF, no SRF, retinal drusen , epiretinal membrane, macular pucker (ERM with mild pucker superior macula - stable).   Notes *Images captured and stored on drive  Diagnosis / Impression:  NFP; no IRF/SRF OU Mild ERM OU Retinal drusen OU  Clinical management:  See below  Abbreviations: NFP - Normal foveal profile. CME - cystoid macular edema. PED - pigment epithelial detachment. IRF - intraretinal fluid. SRF - subretinal fluid. EZ - ellipsoid zone. ERM - epiretinal membrane. ORA - outer retinal atrophy. ORT - outer retinal tubulation. SRHM - subretinal hyper-reflective material            ASSESSMENT/PLAN:    ICD-10-CM   1. Retinal tear of left eye  H33.312     2. History of repair of retinal tear by laser photocoagulation  Z98.890     3. Epiretinal membrane (ERM) of both eyes  H35.373 OCT, Retina - OU - Both Eyes    4. Early dry stage nonexudative age-related macular degeneration of both eyes  H35.3131 OCT, Retina - OU - Both Eyes    5. Combined forms of age-related  cataract of both eyes  H25.813     6. Diplopia  H53.2      1. Retinal tear, OS     - tear located at 0230 with pigmented demarcation line, but no SRF  - s/p laser retinopexy (02.12.20) -- excellent laser in place  - stable  - no new RT/RD OS  - pt is cleared from a retina standpoint for release to Dr. Zetta Bills and resumption of primary eye care  2. History of retinal tear OD  - operculated hole at 1030  - s/p laser retinopexy w/ Dr. Stephannie Li 2013  - no new RT/RD OD   3. Epiretinal membrane, OU (OS>OD)  - mild ERM OU -- stable on OCT since 2019  - BCVA 20/30 OD, 20/20 OS -- improved OU  - asymptomatic, no metamorphopsia  - no indication for surgery at this time  - monitor  4. Age related macular degeneration, non-exudative, both eyes  - early stage; mild drusen  - The incidence, anatomy, and pathology of dry AMD, risk of progression, and the AREDS and AREDS 2 study including smoking risks discussed with patient.  - recommend Amsler grid monitoring  5. Mixed form age related cataract OU  - The symptoms of cataract, surgical options, and treatments and risks were discussed with patient.  - discussed diagnosis and progression  - under the expert management of Dr Zetta Bills  6. Intermittent monocular diplopia and blurred vision OS -- resolved  - pt reported intermittent monocular diplopia OS -- images sometimes skewed, sometimes vertical  - during visual acuity testing OS, improved with pinhole  - during EOM testing -- nonpresent in primary gaze and elicited in extreme gazes  - suspect may be related to mild progression of cataract OS  - retina stable  - under the expert management of  Dr. Zetta Bills -- stably improved with aggressive treatment of dry eyes  Ophthalmic Meds Ordered this visit:  No orders of the defined types were placed in this encounter.     Return if symptoms worsen or fail to improve.  There are no Patient Instructions on file for this  visit.   Explained the diagnoses, plan, and follow up with the patient and they expressed understanding.  Patient expressed understanding of the importance of proper follow up care.   This document serves as a record of services personally performed by Karie Chimera, MD, PhD. It was created on their behalf by Gerilyn Nestle, COT an ophthalmic technician. The creation of this record is the provider's dictation and/or activities during the visit.    Electronically signed by:  Gerilyn Nestle, COT  03.26.24 12:14 PM  This document serves as a record of services personally performed by Karie Chimera, MD, PhD. It was created on their behalf by Glee Arvin. Manson Passey, OA an ophthalmic technician. The creation of this record is the provider's dictation and/or activities during the visit.    Electronically signed by: Glee Arvin. Manson Passey, [place] 04.09.2024 12:14 PM  Karie Chimera, M.D., Ph.D. Diseases & Surgery of the Retina and Vitreous [facility]  I have reviewed the above documentation for accuracy and completeness, and I agree with the above. Karie Chimera, M.D., Ph.D. 03/02/23 12:15 PM   Abbreviations: M myopia (nearsighted); A astigmatism; H hyperopia (farsighted); P presbyopia; Mrx spectacle prescription;  CTL contact lenses; OD right eye; OS left eye; OU both eyes  XT exotropia; ET esotropia; PEK punctate epithelial keratitis; PEE punctate epithelial erosions; DES dry eye syndrome; MGD meibomian gland dysfunction; ATs artificial tears; PFAT's preservative free artificial tears; NSC nuclear sclerotic cataract; PSC posterior subcapsular cataract; ERM epi-retinal membrane; PVD posterior vitreous detachment; RD retinal detachment; DM diabetes mellitus; DR diabetic retinopathy; NPDR non-proliferative diabetic retinopathy; PDR proliferative diabetic retinopathy; CSME clinically significant macular edema; DME diabetic macular edema; dbh dot blot hemorrhages; CWS cotton wool spot;  POAG primary open angle glaucoma; C/D cup-to-disc ratio; HVF humphrey visual field; GVF goldmann visual field; OCT optical coherence tomography; IOP intraocular pressure; BRVO Branch retinal vein occlusion; CRVO central retinal vein occlusion; CRAO central retinal artery occlusion; BRAO branch retinal artery occlusion; RT retinal tear; SB scleral buckle; PPV pars plana vitrectomy; VH Vitreous hemorrhage; PRP panretinal laser photocoagulation; IVK intravitreal kenalog; VMT vitreomacular traction; MH Macular hole;  NVD neovascularization of the disc; NVE neovascularization elsewhere; AREDS age related eye disease study; ARMD age related macular degeneration; POAG primary open angle glaucoma; EBMD epithelial/anterior basement membrane dystrophy; ACIOL anterior chamber intraocular lens; IOL intraocular lens; PCIOL posterior chamber intraocular lens; Phaco/IOL phacoemulsification with intraocular lens placement; PRK photorefractive keratectomy; LASIK laser assisted in situ keratomileusis; HTN hypertension; DM diabetes mellitus; COPD chronic obstructive pulmonary disease

## 2023-02-17 DIAGNOSIS — N8111 Cystocele, midline: Secondary | ICD-10-CM | POA: Diagnosis not present

## 2023-02-17 DIAGNOSIS — N760 Acute vaginitis: Secondary | ICD-10-CM | POA: Diagnosis not present

## 2023-03-01 ENCOUNTER — Encounter (INDEPENDENT_AMBULATORY_CARE_PROVIDER_SITE_OTHER): Payer: Self-pay | Admitting: Ophthalmology

## 2023-03-01 ENCOUNTER — Ambulatory Visit (INDEPENDENT_AMBULATORY_CARE_PROVIDER_SITE_OTHER): Payer: Medicare PPO | Admitting: Ophthalmology

## 2023-03-01 DIAGNOSIS — Z9889 Other specified postprocedural states: Secondary | ICD-10-CM | POA: Diagnosis not present

## 2023-03-01 DIAGNOSIS — H532 Diplopia: Secondary | ICD-10-CM | POA: Diagnosis not present

## 2023-03-01 DIAGNOSIS — H35373 Puckering of macula, bilateral: Secondary | ICD-10-CM

## 2023-03-01 DIAGNOSIS — H353131 Nonexudative age-related macular degeneration, bilateral, early dry stage: Secondary | ICD-10-CM | POA: Diagnosis not present

## 2023-03-01 DIAGNOSIS — H33312 Horseshoe tear of retina without detachment, left eye: Secondary | ICD-10-CM

## 2023-03-01 DIAGNOSIS — H25813 Combined forms of age-related cataract, bilateral: Secondary | ICD-10-CM

## 2023-03-02 ENCOUNTER — Encounter (INDEPENDENT_AMBULATORY_CARE_PROVIDER_SITE_OTHER): Payer: Self-pay | Admitting: Ophthalmology

## 2023-03-21 ENCOUNTER — Telehealth: Payer: Self-pay

## 2023-03-21 MED ORDER — DILTIAZEM HCL 30 MG PO TABS: 30.0000 mg | ORAL_TABLET | Freq: Three times a day (TID) | ORAL | 6 refills | Status: DC | PRN

## 2023-03-21 NOTE — Telephone Encounter (Signed)
-----   Message from Marjo Bicker, MD sent at 03/18/2023  2:12 PM EDT ----- Event monitor showed 28 runs of SVT episodes but these were asymptomatic.  Since patient was having palpitations that were resolving when I saw her, it is safer to prescribe p.o. diltiazem 30 mg every 8 hours daily as needed for palpitations.

## 2023-03-21 NOTE — Telephone Encounter (Signed)
Patient notified and verbalized understanding. Patient had no questions or concerns at this time.  

## 2023-04-14 DIAGNOSIS — N8111 Cystocele, midline: Secondary | ICD-10-CM | POA: Diagnosis not present

## 2023-04-19 ENCOUNTER — Other Ambulatory Visit: Payer: Self-pay | Admitting: Family Medicine

## 2023-04-19 DIAGNOSIS — M810 Age-related osteoporosis without current pathological fracture: Secondary | ICD-10-CM

## 2023-05-14 DIAGNOSIS — H3530 Unspecified macular degeneration: Secondary | ICD-10-CM | POA: Diagnosis not present

## 2023-05-14 DIAGNOSIS — K219 Gastro-esophageal reflux disease without esophagitis: Secondary | ICD-10-CM | POA: Diagnosis not present

## 2023-05-14 DIAGNOSIS — E039 Hypothyroidism, unspecified: Secondary | ICD-10-CM | POA: Diagnosis not present

## 2023-05-14 DIAGNOSIS — I129 Hypertensive chronic kidney disease with stage 1 through stage 4 chronic kidney disease, or unspecified chronic kidney disease: Secondary | ICD-10-CM | POA: Diagnosis not present

## 2023-05-14 DIAGNOSIS — J302 Other seasonal allergic rhinitis: Secondary | ICD-10-CM | POA: Diagnosis not present

## 2023-05-14 DIAGNOSIS — N181 Chronic kidney disease, stage 1: Secondary | ICD-10-CM | POA: Diagnosis not present

## 2023-05-14 DIAGNOSIS — M1990 Unspecified osteoarthritis, unspecified site: Secondary | ICD-10-CM | POA: Diagnosis not present

## 2023-05-14 DIAGNOSIS — R32 Unspecified urinary incontinence: Secondary | ICD-10-CM | POA: Diagnosis not present

## 2023-05-14 DIAGNOSIS — Z8249 Family history of ischemic heart disease and other diseases of the circulatory system: Secondary | ICD-10-CM | POA: Diagnosis not present

## 2023-06-09 DIAGNOSIS — N8111 Cystocele, midline: Secondary | ICD-10-CM | POA: Diagnosis not present

## 2023-07-13 ENCOUNTER — Other Ambulatory Visit: Payer: Self-pay | Admitting: Family Medicine

## 2023-07-13 DIAGNOSIS — E039 Hypothyroidism, unspecified: Secondary | ICD-10-CM

## 2023-07-14 NOTE — Telephone Encounter (Signed)
Requested Prescriptions  Pending Prescriptions Disp Refills   levothyroxine (SYNTHROID) 25 MCG tablet [Pharmacy Med Name: LEVOTHYROXINE SODIUM 25 MCG TAB] 90 tablet 1    Sig: TAKE ONE (1) TABLET BY MOUTH EVERY DAY     Endocrinology:  Hypothyroid Agents Failed - 07/13/2023 11:21 AM      Failed - Valid encounter within last 12 months    Recent Outpatient Visits           1 year ago Hypothyroidism, unspecified type   [facility] Donita Brooks, MD   2 years ago Bronchitis with acute wheezing   [facility] Tanya Nones, Priscille Heidelberg, MD   2 years ago Allergic sinusitis   [facility] Donita Brooks, MD   2 years ago Hypothyroidism, unspecified type   [facility] Donita Brooks, MD   3 years ago Acute non-recurrent maxillary sinusitis   [facility] Elmore Guise, FNP       Future Appointments             In 3 months Pickard, Priscille Heidelberg, MD [facility], [facility]   In 5 months Mallipeddi, Orion Modest, MD [facility], [place] [facility]            Passed - TSH in normal range and within 360 days    TSH  Date Value Ref Range Status  10/19/2022 3.08 0.40 - 4.50 mIU/L Final

## 2023-07-18 ENCOUNTER — Other Ambulatory Visit: Payer: Self-pay | Admitting: Family Medicine

## 2023-07-18 DIAGNOSIS — M810 Age-related osteoporosis without current pathological fracture: Secondary | ICD-10-CM

## 2023-07-19 NOTE — Telephone Encounter (Signed)
Requested Prescriptions  Pending Prescriptions Disp Refills   meloxicam (MOBIC) 15 MG tablet [Pharmacy Med Name: MELOXICAM 15 MG TAB] 90 tablet 0    Sig: TAKE ONE TABLET (15MG  TOTAL) BY MOUTH DAILY     Analgesics:  COX2 Inhibitors Failed - 07/18/2023 11:22 AM      Failed - Manual Review: Labs are only required if the patient has taken medication for more than 8 weeks.      Failed - Valid encounter within last 12 months    Recent Outpatient Visits           1 year ago Hypothyroidism, unspecified type   [facility] Donita Brooks, MD   2 years ago Bronchitis with acute wheezing   [facility] Tanya Nones, Priscille Heidelberg, MD   2 years ago Allergic sinusitis   [facility] Tanya Nones, Priscille Heidelberg, MD   2 years ago Hypothyroidism, unspecified type   [facility] Donita Brooks, MD   3 years ago Acute non-recurrent maxillary sinusitis   [facility] Elmore Guise, FNP       Future Appointments             In 3 months Pickard, Priscille Heidelberg, MD [facility], [facility]   In 5 months Mallipeddi, Orion Modest, MD [facility] [place] [facility]            Passed - HGB in normal range and within 360 days    Hemoglobin  Date Value Ref Range Status  10/19/2022 13.5 11.7 - 15.5 g/dL Final         Passed - Cr in normal range and within 360 days    Creat  Date Value Ref Range Status  10/19/2022 0.64 0.60 - 1.00 mg/dL Final         Passed - HCT in normal range and within 360 days    HCT  Date Value Ref Range Status  10/19/2022 39.8 35.0 - 45.0 % Final         Passed - AST in normal range and within 360 days    AST  Date Value Ref Range Status  10/19/2022 13 10 - 35 U/L Final         Passed - ALT in normal range and within 360 days    ALT  Date Value Ref Range Status  10/19/2022 12 6 - 29 U/L Final         Passed - eGFR is 30 or above and within 360 days    GFR, Est  African American  Date Value Ref Range Status  09/25/2020 89 > OR = 60 mL/min/1.28m2 Final   GFR, Est Non African American  Date Value Ref Range Status  09/25/2020 77 > OR = 60 mL/min/1.71m2 Final   eGFR  Date Value Ref Range Status  10/19/2022 93 > OR = 60 mL/min/1.74m2 Final         Passed - Patient is not pregnant

## 2023-08-10 DIAGNOSIS — N819 Female genital prolapse, unspecified: Secondary | ICD-10-CM | POA: Diagnosis not present

## 2023-08-30 ENCOUNTER — Other Ambulatory Visit (HOSPITAL_COMMUNITY): Payer: Self-pay | Admitting: Family Medicine

## 2023-08-30 DIAGNOSIS — R921 Mammographic calcification found on diagnostic imaging of breast: Secondary | ICD-10-CM

## 2023-09-01 ENCOUNTER — Ambulatory Visit: Payer: Medicare PPO | Admitting: Family Medicine

## 2023-09-01 ENCOUNTER — Encounter: Payer: Self-pay | Admitting: Family Medicine

## 2023-09-01 ENCOUNTER — Other Ambulatory Visit: Payer: Self-pay | Admitting: Family Medicine

## 2023-09-01 VITALS — BP 120/72 | HR 86 | Temp 97.8°F | Ht 62.0 in | Wt 146.6 lb

## 2023-09-01 DIAGNOSIS — R1319 Other dysphagia: Secondary | ICD-10-CM
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
## 2023-09-06 ENCOUNTER — Encounter (INDEPENDENT_AMBULATORY_CARE_PROVIDER_SITE_OTHER): Payer: Self-pay | Admitting: *Deleted

## 2023-09-07 ENCOUNTER — Ambulatory Visit (INDEPENDENT_AMBULATORY_CARE_PROVIDER_SITE_OTHER): Payer: Medicare PPO | Admitting: Gastroenterology

## 2023-09-07 ENCOUNTER — Other Ambulatory Visit: Payer: Self-pay

## 2023-09-07 ENCOUNTER — Encounter (HOSPITAL_COMMUNITY)
Admission: RE | Admit: 2023-09-07 | Discharge: 2023-09-07 | Disposition: A | Discharge status: Home / Self Care | Payer: Medicare PPO | Source: Ambulatory Visit | Attending: Gastroenterology | Admitting: Gastroenterology

## 2023-09-07 ENCOUNTER — Encounter (INDEPENDENT_AMBULATORY_CARE_PROVIDER_SITE_OTHER): Payer: Self-pay | Admitting: Gastroenterology

## 2023-09-07 ENCOUNTER — Encounter (HOSPITAL_COMMUNITY): Payer: Self-pay

## 2023-09-07 VITALS — BP 157/85 | HR 76 | Temp 97.7°F | Ht 62.0 in | Wt 146.1 lb

## 2023-09-07 DIAGNOSIS — Z1211 Encounter for screening for malignant neoplasm of colon: Secondary | ICD-10-CM | POA: Insufficient documentation

## 2023-09-07 DIAGNOSIS — R1310 Dysphagia, unspecified: Secondary | ICD-10-CM | POA: Diagnosis not present

## 2023-09-07 DIAGNOSIS — R1319 Other dysphagia: Secondary | ICD-10-CM | POA: Insufficient documentation

## 2023-09-07 DIAGNOSIS — I10 Essential (primary) hypertension: Secondary | ICD-10-CM | POA: Insufficient documentation

## 2023-09-07 NOTE — H&P (View-Only) (Signed)
Vista Lawman , M.D. Gastroenterology & Hepatology [facility] Gastroenterology [address] [place], [place] [address] Primary Care Physician: Donita Brooks, MD [address] [place] [address]  Chief Complaint:  Dysphagia   History of Present Illness: Gina Gibbs is a 77 y.o. female chronic GERD, hypothyroidism who presents for evaluation of solid food dysphagia with an episode of self resolving impaction.  Patient reports that a week ago she was having pancakes and bacon where she felt as if food getting was lodged in middle of her chest.  Patient reported that she tried to cough it out, her cousin performed Heimlich maneuver without relief but eventually food passed down with tapping at the back.  Patient has been having intermittent solid food dysphagia since this year reports as if food is going down in middle of her chest no difficulty with liquids. The patient denies having any nausea, vomiting, fever, chills, hematochezia, melena, hematemesis, abdominal distention, abdominal pain, diarrhea, jaundice, pruritus or weight loss.  Last EGD: Many years ago by Dr. Karilyn Cota Last Colonoscopy:none  Negative Cologuard 10/20/2022 and 2020  Do not see any recent labs FHx: neg for any gastrointestinal/liver disease, no malignancies Social: neg smoking, alcohol or illicit drug use Surgical: Hysterectomy  Past Medical History: Past Medical History:  Diagnosis Date   Arthritis    Cataract    Mixed forms OU   Complication of anesthesia    GERD (gastroesophageal reflux disease) 02/28/2013   Hyperlipidemia 02/28/2013   Hypothyroid    Hypothyroid 09/26/2013   Hypothyroidism 03/31/2020   Macular degeneration    non-exu OU   Osteoporosis    PONV (postoperative nausea and vomiting)     Past Surgical History: Past Surgical History:  Procedure Laterality Date   ABDOMINAL HYSTERECTOMY     BREAST BIOPSY Right 10/09/2021   Benign Breast  tissue with increased stromal fibrosis and adenosis with calcifications- No malignancy identfied.   EAR CYST EXCISION  02/23/2012   Procedure: CYST REMOVAL;  Surgeon: Nicki Reaper, MD;  Location: [facility];  Service: Orthopedics;  Laterality: Left;  debridement distal interphalangeal left index finger, excision cyst/loose body   FRACTURE SURGERY  09/2013   right ankle   spinal cyst removal      Family History: Family History  Problem Relation Age of Onset   Diabetes Mother     Social History: Social History   Tobacco Use  Smoking Status Never  Smokeless Tobacco Never   Social History   Substance and Sexual Activity  Alcohol Use Yes   Comment: rare   Social History   Substance and Sexual Activity  Drug Use No    Allergies: Allergies  Allergen Reactions   Tape     adhesives    Medications: Current Outpatient Medications  Medication Sig Dispense Refill   albuterol (VENTOLIN HFA) 108 (90 Base) MCG/ACT inhaler Inhale 2 puffs into the lungs every 6 (six) hours as needed for wheezing or shortness of breath. 8 g 0   diltiazem (CARDIZEM) 30 MG tablet Take 1 tablet (30 mg total) by mouth every 8 (eight) hours as needed (Palpitations). 30 tablet 6   fluticasone (FLONASE) 50 MCG/ACT nasal spray USE TWO SPRAYS IN BOTH NOSTRILS DAILY 16 g 0   levothyroxine (SYNTHROID) 25 MCG tablet TAKE ONE (1) TABLET BY MOUTH EVERY DAY 90 tablet 1   montelukast (SINGULAIR) 10 MG tablet TAKE ONE TABLET BY MOUTH EVERY NIGHT AT BEDTIME 30 tablet 3  nystatin cream (MYCOSTATIN) APPLY TO THE AFFECTED AREA(S) TWO TIMES A DAY     Omega-3 Fatty Acids (FISH OIL) 1000 MG CAPS Take 1,000 mg by mouth.     pantoprazole (PROTONIX) 40 MG tablet TAKE ONE TABLET (40MG  TOTAL) BY MOUTH DAILY 90 tablet 2   therapeutic multivitamin-minerals (THERAGRAN-M) tablet Take 1 tablet by mouth daily.     No current facility-administered medications for this visit.    Review of Systems: GENERAL: negative  for malaise, night sweats HEENT: No changes in hearing or vision, no nose bleeds or other nasal problems. NECK: Negative for lumps, goiter, pain and significant neck swelling RESPIRATORY: Negative for cough, wheezing CARDIOVASCULAR: Negative for chest pain, leg swelling, palpitations, orthopnea GI: SEE HPI MUSCULOSKELETAL: Negative for joint pain or swelling, back pain, and muscle pain. SKIN: Negative for lesions, rash HEMATOLOGY Negative for prolonged bleeding, bruising easily, and swollen nodes. ENDOCRINE: Negative for cold or heat intolerance, polyuria, polydipsia and goiter. NEURO: negative for tremor, gait imbalance, syncope and seizures. The remainder of the review of systems is noncontributory.   Physical Exam: BP (!) 157/85   Pulse 76   Temp 97.7 F (36.5 C) (Oral)   Ht 5\' 2"  (1.575 m)   Wt 146 lb 1.6 oz (66.3 kg)   BMI 26.72 kg/m  GENERAL: The patient is AO x3, in no acute distress. HEENT: Head is normocephalic and atraumatic. EOMI are intact. Mouth is well hydrated and without lesions. NECK: Supple. No masses LUNGS: Clear to auscultation. No presence of rhonchi/wheezing/rales. Adequate chest expansion HEART: RRR, normal s1 and s2. ABDOMEN: Soft, nontender, no guarding, no peritoneal signs, and nondistended. BS +. No masses. EXTREMITIES: Without any cyanosis, clubbing, rash, lesions or edema. NEUROLOGIC: AOx3, no focal motor deficit. SKIN: no jaundice, no rashes   Imaging/Labs: as above     Latest Ref Rng & Units 10/19/2022   10:03 AM 10/05/2021    9:41 AM 09/25/2020    8:23 AM  CBC  WBC 3.8 - 10.8 Thousand/uL 7.3  8.3  8.0   Hemoglobin 11.7 - 15.5 g/dL 16.1  09.6  04.5   Hematocrit 35.0 - 45.0 % 39.8  41.0  41.4   Platelets 140 - 400 Thousand/uL 302  302  307    I personally reviewed and interpreted the available labs, imaging and endoscopic files.  Impression and Plan: Gina Gibbs is a 77 y.o. female chronic GERD, hypothyroidism who presents for  evaluation of solid food dysphagia with an episode of self resolving impaction.  #Solid food dysphagia  This could be esophageal ring, web, stricture in setting of chronic GERD but need to rule out malignancy given his advanced age and dysphagia is considered an alarm symptom  Episode of self resolving food impaction happened on PPI hence unlikely GERD related  Patient had an episode of self resolving food impaction episode requiring Heimlich maneuver recently   Recommendation :  -Upper endoscopy tomorrow with esophageal biopsies +/- dilation  -Aspiration precautions - Cut food in small pieces and chew food thoroughly  - Discussed avoidance of eating within 3 hours of lying down to sleep and benefit of blocks to elevate head of bed. Also, will benefit from avoiding carbonated drinks/sodas or food that has tomatoes, spicy or greasy food.   # Colon cancer screening  Patient is up to date to colon cancer screening with Cologuard negative on 10/20/2022 and 2020  Patient is average risk for colon cancer and may continue to get screening with either Cologuard or colonoscopy  in future, unless she have any new symptoms such as new constipation, change in caliber of stool or hematochezia  #Hypertension  The patient was found to have elevated blood pressure when vital signs were checked in the office. The blood pressure was rechecked by the nursing staff and it was found be persistently elevated >140/90 mmHg. I personally advised to the patient to follow up closely with PCP for hypertension control.    All questions were answered.      Vista Lawman, MD Gastroenterology and Hepatology [facility] [address] [facility]   This chart has been completed using [facility] Dictation software, and while attempts have been made to ensure accuracy , certain words and phrases may not be transcribed as intended

## 2023-09-07 NOTE — Patient Instructions (Signed)
It was very nice to meet you today, as dicussed with will plan for the following :   1) upper endoscopy  - Cut food in small pieces and chew food thoroughly

## 2023-09-07 NOTE — Progress Notes (Signed)
Vista Lawman , M.D. Gastroenterology & Hepatology [facility]astroenterology [address] [place], [place] [address] Primary Care Physician: Donita Brooks, MD [address] [place] [address]  Chief Complaint:  Dysphagia   History of Present Illness: Gina Gibbs is a 77 y.o. female chronic GERD, hypothyroidism who presents for evaluation of solid food dysphagia with an episode of self resolving impaction.  Patient reports that a week ago she was having pancakes and bacon where she felt as if food getting was lodged in middle of her chest.  Patient reported that she tried to cough it out, her cousin performed Heimlich maneuver without relief but eventually food passed down with tapping at the back.  Patient has been having intermittent solid food dysphagia since this year reports as if food is going down in middle of her chest no difficulty with liquids. The patient denies having any nausea, vomiting, fever, chills, hematochezia, melena, hematemesis, abdominal distention, abdominal pain, diarrhea, jaundice, pruritus or weight loss.  Last EGD: Many years ago by Dr. Karilyn Cota Last Colonoscopy:none  Negative Cologuard 10/20/2022 and 2020  Do not see any recent labs FHx: neg for any gastrointestinal/liver disease, no malignancies Social: neg smoking, alcohol or illicit drug use Surgical: Hysterectomy  Past Medical History: Past Medical History:  Diagnosis Date   Arthritis    Cataract    Mixed forms OU   Complication of anesthesia    GERD (gastroesophageal reflux disease) 02/28/2013   Hyperlipidemia 02/28/2013   Hypothyroid    Hypothyroid 09/26/2013   Hypothyroidism 03/31/2020   Macular degeneration    non-exu OU   Osteoporosis    PONV (postoperative nausea and vomiting)     Past Surgical History: Past Surgical History:  Procedure Laterality Date   ABDOMINAL HYSTERECTOMY     BREAST BIOPSY Right 10/09/2021   Benign Breast  tissue with increased stromal fibrosis and adenosis with calcifications- No malignancy identfied.   EAR CYST EXCISION  02/23/2012   Procedure: CYST REMOVAL;  Surgeon: Nicki Reaper, MD;  Location: [facility];  Service: Orthopedics;  Laterality: Left;  debridement distal interphalangeal left index finger, excision cyst/loose body   FRACTURE SURGERY  09/2013   right ankle   spinal cyst removal      Family History: Family History  Problem Relation Age of Onset   Diabetes Mother     Social History: Social History   Tobacco Use  Smoking Status Never  Smokeless Tobacco Never   Social History   Substance and Sexual Activity  Alcohol Use Yes   Comment: rare   Social History   Substance and Sexual Activity  Drug Use No    Allergies: Allergies  Allergen Reactions   Tape     adhesives    Medications: Current Outpatient Medications  Medication Sig Dispense Refill   albuterol (VENTOLIN HFA) 108 (90 Base) MCG/ACT inhaler Inhale 2 puffs into the lungs every 6 (six) hours as needed for wheezing or shortness of breath. 8 g 0   diltiazem (CARDIZEM) 30 MG tablet Take 1 tablet (30 mg total) by mouth every 8 (eight) hours as needed (Palpitations). 30 tablet 6   fluticasone (FLONASE) 50 MCG/ACT nasal spray USE TWO SPRAYS IN BOTH NOSTRILS DAILY 16 g 0   levothyroxine (SYNTHROID) 25 MCG tablet TAKE ONE (1) TABLET BY MOUTH EVERY DAY 90 tablet 1   montelukast (SINGULAIR) 10 MG tablet TAKE ONE TABLET BY MOUTH EVERY NIGHT AT BEDTIME 30 tablet 3  nystatin cream (MYCOSTATIN) APPLY TO THE AFFECTED AREA(S) TWO TIMES A DAY     Omega-3 Fatty Acids (FISH OIL) 1000 MG CAPS Take 1,000 mg by mouth.     pantoprazole (PROTONIX) 40 MG tablet TAKE ONE TABLET (40MG  TOTAL) BY MOUTH DAILY 90 tablet 2   therapeutic multivitamin-minerals (THERAGRAN-M) tablet Take 1 tablet by mouth daily.     No current facility-administered medications for this visit.    Review of Systems: GENERAL: negative  for malaise, night sweats HEENT: No changes in hearing or vision, no nose bleeds or other nasal problems. NECK: Negative for lumps, goiter, pain and significant neck swelling RESPIRATORY: Negative for cough, wheezing CARDIOVASCULAR: Negative for chest pain, leg swelling, palpitations, orthopnea GI: SEE HPI MUSCULOSKELETAL: Negative for joint pain or swelling, back pain, and muscle pain. SKIN: Negative for lesions, rash HEMATOLOGY Negative for prolonged bleeding, bruising easily, and swollen nodes. ENDOCRINE: Negative for cold or heat intolerance, polyuria, polydipsia and goiter. NEURO: negative for tremor, gait imbalance, syncope and seizures. The remainder of the review of systems is noncontributory.   Physical Exam: BP (!) 157/85   Pulse 76   Temp 97.7 F (36.5 C) (Oral)   Ht 5\' 2"  (1.575 m)   Wt 146 lb 1.6 oz (66.3 kg)   BMI 26.72 kg/m  GENERAL: The patient is AO x3, in no acute distress. HEENT: Head is normocephalic and atraumatic. EOMI are intact. Mouth is well hydrated and without lesions. NECK: Supple. No masses LUNGS: Clear to auscultation. No presence of rhonchi/wheezing/rales. Adequate chest expansion HEART: RRR, normal s1 and s2. ABDOMEN: Soft, nontender, no guarding, no peritoneal signs, and nondistended. BS +. No masses. EXTREMITIES: Without any cyanosis, clubbing, rash, lesions or edema. NEUROLOGIC: AOx3, no focal motor deficit. SKIN: no jaundice, no rashes   Imaging/Labs: as above     Latest Ref Rng & Units 10/19/2022   10:03 AM 10/05/2021    9:41 AM 09/25/2020    8:23 AM  CBC  WBC 3.8 - 10.8 Thousand/uL 7.3  8.3  8.0   Hemoglobin 11.7 - 15.5 g/dL 16.1  09.6  04.5   Hematocrit 35.0 - 45.0 % 39.8  41.0  41.4   Platelets 140 - 400 Thousand/uL 302  302  307    I personally reviewed and interpreted the available labs, imaging and endoscopic files.  Impression and Plan: Gina Gibbs is a 77 y.o. female chronic GERD, hypothyroidism who presents for  evaluation of solid food dysphagia with an episode of self resolving impaction.  #Solid food dysphagia  This could be esophageal ring, web, stricture in setting of chronic GERD but need to rule out malignancy given his advanced age and dysphagia is considered an alarm symptom  Episode of self resolving food impaction happened on PPI hence unlikely GERD related  Patient had an episode of self resolving food impaction episode requiring Heimlich maneuver recently   Recommendation :  -Upper endoscopy tomorrow with esophageal biopsies +/- dilation  -Aspiration precautions - Cut food in small pieces and chew food thoroughly  - Discussed avoidance of eating within 3 hours of lying down to sleep and benefit of blocks to elevate head of bed. Also, will benefit from avoiding carbonated drinks/sodas or food that has tomatoes, spicy or greasy food.   # Colon cancer screening  Patient is up to date to colon cancer screening with Cologuard negative on 10/20/2022 and 2020  Patient is average risk for colon cancer and may continue to get screening with either Cologuard or colonoscopy  in future, unless she have any new symptoms such as new constipation, change in caliber of stool or hematochezia  #Hypertension  The patient was found to have elevated blood pressure when vital signs were checked in the office. The blood pressure was rechecked by the nursing staff and it was found be persistently elevated >140/90 mmHg. I personally advised to the patient to follow up closely with PCP for hypertension control.    All questions were answered.      Vista Lawman, MD Gastroenterology and Hepatology [facility]   This chart has been completed using [facility] Dictation software, and while attempts have been made to ensure accuracy , certain words and phrases may not be transcribed as intended

## 2023-09-08 ENCOUNTER — Encounter (HOSPITAL_COMMUNITY): Payer: Self-pay

## 2023-09-08 ENCOUNTER — Encounter (HOSPITAL_COMMUNITY)
Admission: RE | Disposition: A | Discharge status: Home / Self Care | Payer: Self-pay | Source: Home / Self Care | Attending: Gastroenterology

## 2023-09-08 ENCOUNTER — Other Ambulatory Visit: Payer: Self-pay

## 2023-09-08 ENCOUNTER — Ambulatory Visit (HOSPITAL_BASED_OUTPATIENT_CLINIC_OR_DEPARTMENT_OTHER): Payer: Medicare PPO | Admitting: Anesthesiology

## 2023-09-08 ENCOUNTER — Ambulatory Visit (HOSPITAL_COMMUNITY)
Admission: RE | Admit: 2023-09-08 | Discharge: 2023-09-08 | Disposition: A | Discharge status: Home / Self Care | Payer: Medicare PPO | Attending: Gastroenterology | Admitting: Gastroenterology

## 2023-09-08 ENCOUNTER — Ambulatory Visit (HOSPITAL_COMMUNITY): Payer: Medicare PPO | Admitting: Anesthesiology

## 2023-09-08 DIAGNOSIS — K3189 Other diseases of stomach and duodenum: Secondary | ICD-10-CM | POA: Insufficient documentation

## 2023-09-08 DIAGNOSIS — I10 Essential (primary) hypertension: Secondary | ICD-10-CM | POA: Insufficient documentation

## 2023-09-08 DIAGNOSIS — K219 Gastro-esophageal reflux disease without esophagitis: Secondary | ICD-10-CM | POA: Insufficient documentation

## 2023-09-08 DIAGNOSIS — K222 Esophageal obstruction: Secondary | ICD-10-CM | POA: Diagnosis not present

## 2023-09-08 DIAGNOSIS — R1310 Dysphagia, unspecified: Secondary | ICD-10-CM | POA: Diagnosis not present

## 2023-09-08 DIAGNOSIS — K317 Polyp of stomach and duodenum: Secondary | ICD-10-CM | POA: Insufficient documentation

## 2023-09-08 DIAGNOSIS — E039 Hypothyroidism, unspecified: Secondary | ICD-10-CM | POA: Insufficient documentation

## 2023-09-08 DIAGNOSIS — K2970 Gastritis, unspecified, without bleeding: Secondary | ICD-10-CM

## 2023-09-08 DIAGNOSIS — K449 Diaphragmatic hernia without obstruction or gangrene: Secondary | ICD-10-CM | POA: Diagnosis not present

## 2023-09-08 HISTORY — PX: BALLOON DILATION: SHX5330

## 2023-09-08 HISTORY — PX: POLYPECTOMY: SHX5525

## 2023-09-08 HISTORY — PX: BIOPSY: SHX5522

## 2023-09-08 HISTORY — PX: ESOPHAGOGASTRODUODENOSCOPY (EGD) WITH PROPOFOL: SHX5813

## 2023-09-08 SURGERY — ESOPHAGOGASTRODUODENOSCOPY (EGD) WITH PROPOFOL
Anesthesia: General

## 2023-09-08 MED ORDER — LIDOCAINE HCL (CARDIAC) PF 100 MG/5ML IV SOSY
PREFILLED_SYRINGE | INTRAVENOUS | Status: DC | PRN
  Administered 2023-09-08: 50 mg via INTRAVENOUS

## 2023-09-08 MED ORDER — PROPOFOL 10 MG/ML IV BOLUS
INTRAVENOUS | Status: DC | PRN
  Administered 2023-09-08: 100 mg via INTRAVENOUS
  Administered 2023-09-08: 50 mg via INTRAVENOUS
  Administered 2023-09-08: 20 mg via INTRAVENOUS

## 2023-09-08 MED ORDER — LACTATED RINGERS IV SOLN: INTRAVENOUS | Status: DC

## 2023-09-08 MED ORDER — PROPOFOL 500 MG/50ML IV EMUL
INTRAVENOUS | Status: DC | PRN
  Administered 2023-09-08: 125 ug/kg/min via INTRAVENOUS

## 2023-09-08 NOTE — Interval H&P Note (Signed)
History and Physical Interval Note:  09/08/2023 7:35 AM  Gina Gibbs  has presented today for surgery, with the diagnosis of DYSPHAGIA.  The various methods of treatment have been discussed with the patient and family. After consideration of risks, benefits and other options for treatment, the patient has consented to  Procedure(s) with comments: ESOPHAGOGASTRODUODENOSCOPY (EGD) WITH PROPOFOL (N/A) - 8:30AM;ASA 1-2 as a surgical intervention.  The patient's history has been reviewed, patient examined, no change in status, stable for surgery.  I have reviewed the patient's chart and labs.  Questions were answered to the patient's satisfaction.     [name] [name]

## 2023-09-08 NOTE — Anesthesia Preprocedure Evaluation (Signed)
Anesthesia Evaluation  Patient identified by MRN, date of birth, ID band Patient awake    Reviewed: Allergy & Precautions, H&P , NPO status , Patient's Chart, lab work & pertinent test results, reviewed documented beta blocker date and time   History of Anesthesia Complications (+) PONV and history of anesthetic complications  Airway Mallampati: II  TM Distance: >3 FB Neck ROM: full    Dental no notable dental hx.    Pulmonary neg pulmonary ROS   Pulmonary exam normal breath sounds clear to auscultation       Cardiovascular Exercise Tolerance: Good hypertension, negative cardio ROS  Rhythm:regular Rate:Normal     Neuro/Psych negative neurological ROS  negative psych ROS   GI/Hepatic negative GI ROS, Neg liver ROS,GERD  ,,  Endo/Other  negative endocrine ROSHypothyroidism    Renal/GU negative Renal ROS  negative genitourinary   Musculoskeletal   Abdominal   Peds  Hematology negative hematology ROS (+)   Anesthesia Other Findings   Reproductive/Obstetrics negative OB ROS                             Anesthesia Physical Anesthesia Plan  ASA: 2  Anesthesia Plan: General   Post-op Pain Management:    Induction:   PONV Risk Score and Plan: Propofol infusion  Airway Management Planned:   Additional Equipment:   Intra-op Plan:   Post-operative Plan:   Informed Consent: I have reviewed the patients History and Physical, chart, labs and discussed the procedure including the risks, benefits and alternatives for the proposed anesthesia with the patient or authorized representative who has indicated his/her understanding and acceptance.     Dental Advisory Given  Plan Discussed with: CRNA  Anesthesia Plan Comments:        Anesthesia Quick Evaluation

## 2023-09-08 NOTE — Op Note (Signed)
[facility] Patient Name: Gina Gibbs Procedure Date: 09/08/2023 8:29 AM MRN: [id number] Date of Birth: 11-06-1946 Attending MD: Sanjuan Dame , MD, [id number] CSN: [id number] Age: 77 Admit Type: Outpatient Procedure:                Upper GI endoscopy Indications:              Dysphagia Providers:                Sanjuan Dame, MD, Sheran Fava, Francoise Ceo                            RN, RN, Cyril Mourning, Technician Referring MD:              Medicines:                Monitored Anesthesia Care Complications:            No immediate complications. Estimated Blood Loss:     Estimated blood loss was minimal. Procedure:                Pre-Anesthesia Assessment:                           - Prior to the procedure, a History and Physical                            was performed, and patient medications and                            allergies were reviewed. The patient's tolerance of                            previous anesthesia was also reviewed. The risks                            and benefits of the procedure and the sedation                            options and risks were discussed with the patient.                            All questions were answered, and informed consent                            was obtained. Prior Anticoagulants: The patient has                            taken no anticoagulant or antiplatelet agents. ASA                            Grade Assessment: II - A patient with mild systemic                            disease. After reviewing the risks and benefits,  the patient was deemed in satisfactory condition to                            undergo the procedure.                           After obtaining informed consent, the endoscope was                            passed under direct vision. Throughout the                            procedure, the patient's blood pressure, pulse, and                             oxygen saturations were monitored continuously. The                            GIF-H190 ([id number]) scope was introduced through the                            mouth, and advanced to the second part of duodenum.                            The upper GI endoscopy was accomplished without                            difficulty. The patient tolerated the procedure                            well. Scope In: 8:43:52 AM Scope Out: 8:57:04 AM Total Procedure Duration: 0 hours 13 minutes 12 seconds  Findings:      A low-grade of narrowing Schatzki ring was found in the lower third of       the esophagus. A TTS dilator was passed through the scope. Dilation with       an 18-19-20 mm balloon dilator was performed to 19 mm. The dilation site       was examined following endoscope reinsertion and showed moderate mucosal       disruption and complete resolution of luminal narrowing. Biopsies were       obtained from the proximal and distal esophagus with cold forceps for       histology of suspected eosinophilic esophagitis.      Multiple 2 to 8 mm sessile polyps with no bleeding and no stigmata of       recent bleeding were found in the entire examined stomach. The polyp was       removed with a cold snare. Resection and retrieval were complete.      Mildly erythematous mucosa without bleeding was found in the gastric       antrum. Biopsies were taken with a cold forceps for histology.      The gastroesophageal flap valve was visualized endoscopically and       classified as Hill Grade III (minimal fold, loose to endoscope, hiatal       hernia likely).      A 3 cm hiatal  hernia was present.      The duodenal bulb and second portion of the duodenum were normal. Impression:               - Low-grade of narrowing Schatzki ring. Dilated.                           - Multiple gastric polyps. Resected and retrieved.                            Representative polyp removed                           -  Erythematous mucosa in the antrum. Biopsied.                           - Gastroesophageal flap valve classified as Hill                            Grade III (minimal fold, loose to endoscope, hiatal                            hernia likely).                           - 3 cm hiatal hernia.                           - Normal duodenal bulb and second portion of the                            duodenum.                           - Biopsies were taken with a cold forceps for                            evaluation of eosinophilic esophagitis. Moderate Sedation:      Per Anesthesia Care Recommendation:           - Patient has a contact number available for                            emergencies. The signs and symptoms of potential                            delayed complications were discussed with the                            patient. Return to normal activities tomorrow.                            Written discharge instructions were provided to the                            patient.                           -  Resume previous diet.                           - Continue present medications.                           - Await pathology results. Procedure Code(s):        --- Professional ---                           [id number], Esophagogastroduodenoscopy, flexible,                            transoral; with removal of tumor(s), polyp(s), or                            other lesion(s) by snare technique                           43249, Esophagogastroduodenoscopy, flexible,                            transoral; with transendoscopic balloon dilation of                            esophagus (less than 30 mm diameter)                           43239, 59, Esophagogastroduodenoscopy, flexible,                            transoral; with biopsy, single or multiple Diagnosis Code(s):        --- Professional ---                           K22.2, Esophageal obstruction                           K31.7, Polyp of  stomach and duodenum                           K31.89, Other diseases of stomach and duodenum                           K44.9, Diaphragmatic hernia without obstruction or                            gangrene                           R13.10, Dysphagia, unspecified CPT copyright 2022 American Medical Association. All rights reserved. The codes documented in this report are preliminary and upon coder review may  be revised to meet current compliance requirements. Sanjuan Dame, MD Sanjuan Dame, MD 09/08/2023 9:05:06 AM This report has been signed electronically. Number of Addenda: 0

## 2023-09-08 NOTE — Anesthesia Procedure Notes (Signed)
Date/Time: 09/08/2023 8:38 AM  Performed by: Franco Nones, CRNAPre-anesthesia Checklist: Patient identified, Emergency Drugs available, Suction available, Timeout performed and Patient being monitored Patient Re-evaluated:Patient Re-evaluated prior to induction Oxygen Delivery Method: Nasal Cannula

## 2023-09-08 NOTE — Transfer of Care (Signed)
Immediate Anesthesia Transfer of Care Note  Patient: Gina Gibbs  Procedure(s) Performed: ESOPHAGOGASTRODUODENOSCOPY (EGD) WITH PROPOFOL BALLOON DILATION BIOPSY POLYPECTOMY  Patient Location: Short Stay  Anesthesia Type:General  Level of Consciousness: drowsy and patient cooperative  Airway & Oxygen Therapy: Patient Spontanous Breathing  Post-op Assessment: Report given to RN and Post -op Vital signs reviewed and stable  Post vital signs: Reviewed and stable  Last Vitals:  Vitals Value Taken Time  BP 93/52 09/08/23 0902  Temp 37.1 C 09/08/23 0902  Pulse 73 09/08/23 0902  Resp 18 09/08/23 0902  SpO2 93 % 09/08/23 0902    Last Pain:  Vitals:   09/08/23 0902  TempSrc: Axillary  PainSc: 0-No pain         Complications: No notable events documented.

## 2023-09-08 NOTE — Discharge Instructions (Signed)

## 2023-09-08 NOTE — Anesthesia Postprocedure Evaluation (Signed)
Anesthesia Post Note  Patient: [name]  Procedure(s) Performed: ESOPHAGOGASTRODUODENOSCOPY (EGD) WITH PROPOFOL BALLOON DILATION BIOPSY POLYPECTOMY  Patient location during evaluation: Phase II Anesthesia Type: General Level of consciousness: awake Pain management: pain level controlled Vital Signs Assessment: post-procedure vital signs reviewed and stable Respiratory status: spontaneous breathing and respiratory function stable Cardiovascular status: blood pressure returned to baseline and stable Postop Assessment: no headache and no apparent nausea or vomiting Anesthetic complications: no Comments: Late entry   No notable events documented.   Last Vitals:  Vitals:   09/08/23 0902 09/08/23 0906  BP: (!) 93/52 (!) 131/57  Pulse: 73 78  Resp: 18 15  Temp: 37.1 C   SpO2: 93% 98%    Last Pain:  Vitals:   09/08/23 0906  TempSrc:   PainSc: 0-No pain                 Windell Norfolk

## 2023-09-09 LAB — SURGICAL PATHOLOGY

## 2023-09-13 ENCOUNTER — Encounter (INDEPENDENT_AMBULATORY_CARE_PROVIDER_SITE_OTHER): Payer: Self-pay | Admitting: *Deleted

## 2023-09-13 NOTE — Progress Notes (Signed)
I reviewed the pathology results. [name], can you send her a letter with the findings as described below please?  Thanks,  Vista Lawman, MD Gastroenterology and Hepatology [facility]  ---------------------------------------------------------------------------------------------  [facility] [address], [address], [place], [place] [address] Phone:  [phone]   09/13/23 Gina Gibbs, [place]   Dear [name],  I am writing to inform you that the biopsies taken during your recent endoscopic examination showed:  No H. Pylori bacteria in stomach , or any early cancer changes to the stomach mucosa ( Intestinal metaplasia)  Normal biopsies of the food-pipe ( no eosinophilic esophagitis )  Also the polyp we removed in the stomach is benign and nothing to be worried about   Please call us at [phone] if you have persistent problems or have questions about your condition that have not been fully answered at this time.  Sincerely,  Vista Lawman, MD Gastroenterology and Hepatology

## 2023-09-15 ENCOUNTER — Encounter (HOSPITAL_COMMUNITY): Payer: Self-pay | Admitting: Gastroenterology

## 2023-09-29 DIAGNOSIS — N760 Acute vaginitis: Secondary | ICD-10-CM | POA: Diagnosis not present

## 2023-10-11 ENCOUNTER — Ambulatory Visit (HOSPITAL_COMMUNITY)
Admission: RE | Admit: 2023-10-11 | Discharge: 2023-10-11 | Disposition: A | Discharge status: Home / Self Care | Payer: Medicare PPO | Source: Ambulatory Visit | Attending: Family Medicine | Admitting: Family Medicine

## 2023-10-11 DIAGNOSIS — R921 Mammographic calcification found on diagnostic imaging of breast: Secondary | ICD-10-CM

## 2023-10-11 DIAGNOSIS — R92333 Mammographic heterogeneous density, bilateral breasts: Secondary | ICD-10-CM | POA: Diagnosis not present

## 2023-10-25 ENCOUNTER — Ambulatory Visit: Payer: Medicare PPO | Admitting: Family Medicine

## 2023-10-25 ENCOUNTER — Encounter: Payer: Self-pay | Admitting: Family Medicine

## 2023-10-25 VITALS — BP 120/62 | HR 65 | Temp 98.1°F | Ht 62.0 in | Wt 146.2 lb

## 2023-10-25 DIAGNOSIS — Z0001 Encounter for general adult medical examination with abnormal findings: Secondary | ICD-10-CM

## 2023-10-25 DIAGNOSIS — M545 Low back pain, unspecified: Secondary | ICD-10-CM | POA: Diagnosis not present

## 2023-10-25 DIAGNOSIS — Z0000 Encounter for general adult medical examination without abnormal findings: Secondary | ICD-10-CM

## 2023-10-25 DIAGNOSIS — E039 Hypothyroidism, unspecified: Secondary | ICD-10-CM

## 2023-10-25 DIAGNOSIS — Z23 Encounter for immunization: Secondary | ICD-10-CM | POA: Diagnosis not present

## 2023-10-25 DIAGNOSIS — Z13220 Encounter for screening for lipoid disorders: Secondary | ICD-10-CM | POA: Diagnosis not present

## 2023-10-25 LAB — URINALYSIS, ROUTINE W REFLEX MICROSCOPIC
Bilirubin Urine: NEGATIVE
Glucose, UA: NEGATIVE
Hgb urine dipstick: NEGATIVE
Hyaline Cast: NONE SEEN /[LPF]
Ketones, ur: NEGATIVE
Nitrite: NEGATIVE
Protein, ur: NEGATIVE
RBC / HPF: NONE SEEN /[HPF] (ref 0–2)
Specific Gravity, Urine: 1.025 (ref 1.001–1.035)
pH: 5.5 (ref 5.0–8.0)

## 2023-10-25 LAB — MICROSCOPIC MESSAGE

## 2023-10-25 MED ORDER — ERYTHROMYCIN 5 MG/GM OP OINT: 1.0000 | TOPICAL_OINTMENT | Freq: Every day | OPHTHALMIC | 0 refills | Status: DC

## 2023-10-25 MED ORDER — CELECOXIB 200 MG PO CAPS: 200.0000 mg | ORAL_CAPSULE | Freq: Every day | ORAL | 3 refills | Status: DC

## 2023-10-25 MED ORDER — CEPHALEXIN 500 MG PO CAPS: 500.0000 mg | ORAL_CAPSULE | Freq: Three times a day (TID) | ORAL | 0 refills | Status: DC

## 2023-10-25 NOTE — Progress Notes (Signed)
Subjective:    Patient ID: Gina Gibbs, female    DOB: 1946-03-01, 77 y.o.   MRN: [id number]  HPI Patient is a 77 year old Caucasian female here today for complete physical exam.  Cologuard was negative 11/23.  Mammogram was normal 11/24.  Dexa showed osteopenia in 2022.  Doesn't require pap due to age >32.  When I last saw the patient, she was having dysphagia.  We had the patient's stop meloxicam and referred her to GI for an endoscopy.  The patient underwent a dilatation.  She states that she is doing much better however her joint pains have dramatically worsened since she stopped meloxicam.  We discussed her options today and she would like to try Celebrex hoping that it is less toxic to her stomach.  She also has seen cardiology in a couple of months was reassuring for significant arrhythmia.  She has been having urinary frequency and urgency and hesitancy for the last week.  Urinalysis shows +2 leukocyte esterase.  She has been taking Azo and that has helped some of her symptoms.  She also has a stye on her right upper eyelid.  She is due for a flu shot, COVID shot, and an RSV shot Immunization History  Administered Date(s) Administered   Fluad Quad(high Dose 65+) 10/01/2019, 10/02/2020, 10/05/2021, 10/19/2022   Influenza-Unspecified 11/10/2016, 12/12/2017   Moderna Covid-19 Vaccine Bivalent Booster 83yrs & up 09/03/2021   Moderna Sars-Covid-2 Vaccination 01/05/2020, 02/03/2020, 10/09/2020   Pneumococcal Conjugate-13 05/22/2018   Pneumococcal Polysaccharide-23 09/20/2013, 11/10/2016   Tdap 05/01/2014, 12/01/2021   Unspecified SARS-COV-2 Vaccination 02/21/2020   Zoster Recombinant(Shingrix) 07/14/2018, 08/23/2019   Zoster, Live 03/13/2014    Past Medical History:  Diagnosis Date   Arthritis    Cataract    Mixed forms OU   Complication of anesthesia    GERD (gastroesophageal reflux disease) 02/28/2013   Hyperlipidemia 02/28/2013   Hypothyroid    Hypothyroid 09/26/2013    Hypothyroidism 03/31/2020   Macular degeneration    non-exu OU   Osteoporosis    PONV (postoperative nausea and vomiting)    Past Surgical History:  Procedure Laterality Date   ABDOMINAL HYSTERECTOMY     BALLOON DILATION  09/08/2023   Procedure: BALLOON DILATION;  Surgeon: Franky Macho, MD;  [facility];  Service: Endoscopy;;   BIOPSY  09/08/2023   Procedure: BIOPSY;  Surgeon: Franky Macho, MD;  [facility];  Service: Endoscopy;;   BREAST BIOPSY Right 10/09/2021   Benign Breast tissue with increased stromal fibrosis and adenosis with calcifications- No malignancy identfied.   EAR CYST EXCISION  02/23/2012   Procedure: CYST REMOVAL;  Surgeon: Nicki Reaper, MD;  Location: [facility];  Service: Orthopedics;  Laterality: Left;  debridement distal interphalangeal left index finger, excision cyst/loose body   ESOPHAGOGASTRODUODENOSCOPY (EGD) WITH PROPOFOL N/A 09/08/2023   Procedure: ESOPHAGOGASTRODUODENOSCOPY (EGD) WITH PROPOFOL;  Surgeon: Franky Macho, MD;  [facility];  Service: Endoscopy;  Laterality: N/A;  8:30AM;ASA 1-2   FRACTURE SURGERY  09/2013   right ankle   POLYPECTOMY  09/08/2023   Procedure: POLYPECTOMY;  Surgeon: Franky Macho, MD;  [facility];  Service: Endoscopy;;   spinal cyst removal     02/23/2012   Current Outpatient Medications on File Prior to Visit  Medication Sig Dispense Refill   albuterol (VENTOLIN HFA) 108 (90 Base) MCG/ACT inhaler Inhale 2 puffs into the lungs every 6 (six) hours as needed for wheezing or shortness  of breath. 8 g 0   diltiazem (CARDIZEM) 30 MG tablet Take 1 tablet (30 mg total) by mouth every 8 (eight) hours as needed (Palpitations). 30 tablet 6   fluticasone (FLONASE) 50 MCG/ACT nasal spray USE TWO SPRAYS IN BOTH NOSTRILS DAILY 16 g 0   levothyroxine (SYNTHROID) 25 MCG tablet TAKE ONE (1) TABLET BY MOUTH EVERY DAY 90 tablet 1   montelukast (SINGULAIR) 10 MG tablet  TAKE ONE TABLET BY MOUTH EVERY NIGHT AT BEDTIME 30 tablet 3   nystatin cream (MYCOSTATIN) APPLY TO THE AFFECTED AREA(S) TWO TIMES A DAY     Omega-3 Fatty Acids (FISH OIL) 1000 MG CAPS Take 1,000 mg by mouth.     pantoprazole (PROTONIX) 40 MG tablet TAKE ONE TABLET (40MG  TOTAL) BY MOUTH DAILY 90 tablet 2   therapeutic multivitamin-minerals (THERAGRAN-M) tablet Take 1 tablet by mouth daily.     No current facility-administered medications on file prior to visit.   Allergies  Allergen Reactions   Tape     adhesives   Social History   Socioeconomic History   Marital status: Married    Spouse name: Not on file   Number of children: Not on file   Years of education: Not on file   Highest education level: Not on file  Occupational History   Occupation: retired  Tobacco Use   Smoking status: Never   Smokeless tobacco: Never  Vaping Use   Vaping status: Never Used  Substance and Sexual Activity   Alcohol use: Yes    Comment: rare   Drug use: No   Sexual activity: Never    Partners: Male    Birth control/protection: Surgical    Comment: hyst  Other Topics Concern   Not on file  Social History Narrative   Not on file   Social Determinants of Health   Financial Resource Strain: Not on file  Food Insecurity: Not on file  Transportation Needs: Not on file  Physical Activity: Not on file  Stress: Not on file  Social Connections: Not on file  Intimate Partner Violence: Not on file     Review of Systems  All other systems reviewed and are negative.      Objective:   Physical Exam Vitals reviewed.  Constitutional:      Appearance: Normal appearance.  Cardiovascular:     Rate and Rhythm: Normal rate and regular rhythm.     Heart sounds: Normal heart sounds. No murmur heard.    No friction rub. No gallop.  Pulmonary:     Effort: Pulmonary effort is normal. No respiratory distress.     Breath sounds: Normal breath sounds. No wheezing, rhonchi or rales.  Abdominal:      General: Bowel sounds are normal. There is no distension.     Palpations: Abdomen is soft.     Tenderness: There is no abdominal tenderness.  Musculoskeletal:     Right lower leg: No edema.     Left lower leg: No edema.  Skin:    General: Skin is warm.     Coloration: Skin is not jaundiced.     Findings: No rash.  Neurological:     General: No focal deficit present.     Mental Status: She is alert and oriented to person, place, and time.     Cranial Nerves: No cranial nerve deficit.     Sensory: No sensory deficit.     Motor: No weakness.     Coordination: Coordination normal.  Gait: Gait normal.            Assessment & Plan:  General medical examination - Plan: CBC with Differential/Platelet, Lipid panel, COMPLETE METABOLIC PANEL WITH GFR, TSH  Hypothyroidism, unspecified type - Plan: TSH  Screening, lipid - Plan: CBC with Differential/Platelet, Lipid panel, COMPLETE METABOLIC PANEL WITH GFR  Acute bilateral low back pain, unspecified whether sciatica present - Plan: Urinalysis, Routine w reflex microscopic I will treat the patient's UTI with Keflex 500 mg 3 times daily for 5 days.  I will treat the stye in her right upper eyelid with erythromycin daily for 5 days and warm compresses 3 times daily along with the lid scrubs.  Check CBC CMP lipid panel and TSH.  Patient can try Celebrex 200 mg a day for pain.  She will stop the medication immediately if she develops any stomach comfort.  Use Tylenol as needed to document.  Received flu shot today.  Recommend COVID shot.  Recommended an RSV shot.  Colon cancer screening and breast cancer screening are up-to-date.  Would recommend a bone density test next year.

## 2023-10-25 NOTE — Addendum Note (Signed)
Addended by: Venia Carbon K on: 10/25/2023 10:09 AM   Modules accepted: Orders

## 2023-10-26 LAB — CBC WITH DIFFERENTIAL/PLATELET
Absolute Lymphocytes: 2118 {cells}/uL (ref 850–3900)
Absolute Monocytes: 562 {cells}/uL (ref 200–950)
Basophils Absolute: 54 {cells}/uL (ref 0–200)
Basophils Relative: 0.7 %
Eosinophils Absolute: 193 {cells}/uL (ref 15–500)
Eosinophils Relative: 2.5 %
HCT: 42.7 % (ref 35.0–45.0)
Hemoglobin: 14.3 g/dL (ref 11.7–15.5)
MCH: 31.1 pg (ref 27.0–33.0)
MCHC: 33.5 g/dL (ref 32.0–36.0)
MCV: 92.8 fL (ref 80.0–100.0)
MPV: 10.2 fL (ref 7.5–12.5)
Monocytes Relative: 7.3 %
Neutro Abs: 4774 {cells}/uL (ref 1500–7800)
Neutrophils Relative %: 62 %
Platelets: 330 10*3/uL (ref 140–400)
RBC: 4.6 10*6/uL (ref 3.80–5.10)
RDW: 12.1 % (ref 11.0–15.0)
Total Lymphocyte: 27.5 %
WBC: 7.7 10*3/uL (ref 3.8–10.8)

## 2023-10-26 LAB — LIPID PANEL
Cholesterol: 220 mg/dL — ABNORMAL HIGH (ref <200)
HDL: 59 mg/dL (ref >=50)
LDL Cholesterol (Calc): 135 mg/dL — ABNORMAL HIGH
Non-HDL Cholesterol (Calc): 161 mg/dL — ABNORMAL HIGH (ref <130)
Total CHOL/HDL Ratio: 3.7 (calc) (ref <5.0)
Triglycerides: 135 mg/dL (ref <150)

## 2023-10-26 LAB — COMPLETE METABOLIC PANEL WITH GFR
AG Ratio: 2 (calc) (ref 1.0–2.5)
ALT: 11 U/L (ref 6–29)
AST: 14 U/L (ref 10–35)
Albumin: 4.4 g/dL (ref 3.6–5.1)
Alkaline phosphatase (APISO): 63 U/L (ref 37–153)
BUN: 12 mg/dL (ref 7–25)
CO2: 27 mmol/L (ref 20–32)
Calcium: 10.1 mg/dL (ref 8.6–10.4)
Chloride: 105 mmol/L (ref 98–110)
Creat: 0.74 mg/dL (ref 0.60–1.00)
Globulin: 2.2 g/dL (ref 1.9–3.7)
Glucose, Bld: 98 mg/dL (ref 65–99)
Potassium: 4 mmol/L (ref 3.5–5.3)
Sodium: 141 mmol/L (ref 135–146)
Total Bilirubin: 1.4 mg/dL — ABNORMAL HIGH (ref 0.2–1.2)
Total Protein: 6.6 g/dL (ref 6.1–8.1)
eGFR: 84 mL/min/{1.73_m2} (ref >=60)

## 2023-10-26 LAB — TSH: TSH: 3.3 m[IU]/L (ref 0.40–4.50)

## 2023-11-04 ENCOUNTER — Other Ambulatory Visit: Payer: Self-pay | Admitting: Family Medicine

## 2023-11-04 DIAGNOSIS — J40 Bronchitis, not specified as acute or chronic: Secondary | ICD-10-CM

## 2023-11-04 DIAGNOSIS — K219 Gastro-esophageal reflux disease without esophagitis: Secondary | ICD-10-CM

## 2023-12-19 ENCOUNTER — Encounter: Payer: Self-pay | Admitting: Internal Medicine

## 2023-12-19 ENCOUNTER — Ambulatory Visit: Payer: Medicare PPO | Attending: Internal Medicine | Admitting: Internal Medicine

## 2023-12-19 VITALS — BP 118/78 | HR 74 | Ht 62.0 in | Wt 147.8 lb

## 2023-12-19 DIAGNOSIS — I10 Essential (primary) hypertension: Secondary | ICD-10-CM | POA: Diagnosis not present

## 2023-12-19 NOTE — Patient Instructions (Signed)
Medication Instructions:  Your physician recommends that you continue on your current medications as directed. Please refer to the Current Medication list given to you today.   Labwork: None today  Testing/Procedures: None today  Follow-Up: As needed  Any Other Special Instructions Will Be Listed Below (If Applicable).  If you need a refill on your cardiac medications before your next appointment, please call your pharmacy.

## 2023-12-19 NOTE — Progress Notes (Signed)
Cardiology Office Note  Date: 12/19/2023   ID: [name], [name] Jan 20, 1946, MRN [id number]  PCP:  Donita Brooks, MD  Cardiologist:  Marjo Bicker, MD Electrophysiologist:  None    History of Present Illness: Gina Gibbs is a 78 y.o. female known to have hypothyroidism is here for palpitations.  Patient has been having palpitations since 2023 but improved in 2024.  Event monitor from 12/2022 showed NSR with no evidence of pauses or malignant atrial/ventricular apneas.  28 runs of SVT were noted but asymptomatic.  She was prescribed diltiazem as needed but she did not have to take it till date.  No interval palpitations since last clinic visit.  No other symptoms of chest pain, SOB/DOE, orthopnea, PND, leg swelling, syncope, palpitations, dizziness.  Her husband is currently admitted to the hospital with some liver issue, air around the liver.  Followed by GI and surgeon.  Past Medical History:  Diagnosis Date   Arthritis    Cataract    Mixed forms OU   Complication of anesthesia    GERD (gastroesophageal reflux disease) 02/28/2013   Hyperlipidemia 02/28/2013   Hypothyroid    Hypothyroid 09/26/2013   Hypothyroidism 03/31/2020   Macular degeneration    non-exu OU   Osteoporosis    PONV (postoperative nausea and vomiting)     Past Surgical History:  Procedure Laterality Date   ABDOMINAL HYSTERECTOMY     BALLOON DILATION  09/08/2023   Procedure: BALLOON DILATION;  Surgeon: Franky Macho, MD;  [facility];  Service: Endoscopy;;   BIOPSY  09/08/2023   Procedure: BIOPSY;  Surgeon: Franky Macho, MD;  [facility];  Service: Endoscopy;;   BREAST BIOPSY Right 10/09/2021   Benign Breast tissue with increased stromal fibrosis and adenosis with calcifications- No malignancy identfied.   EAR CYST EXCISION  02/23/2012   Procedure: CYST REMOVAL;  Surgeon: Nicki Reaper, MD;  Location: [facility];  Service: Orthopedics;   Laterality: Left;  debridement distal interphalangeal left index finger, excision cyst/loose body   ESOPHAGOGASTRODUODENOSCOPY (EGD) WITH PROPOFOL N/A 09/08/2023   Procedure: ESOPHAGOGASTRODUODENOSCOPY (EGD) WITH PROPOFOL;  Surgeon: Franky Macho, MD;  [facility];  Service: Endoscopy;  Laterality: N/A;  8:30AM;ASA 1-2   FRACTURE SURGERY  09/2013   right ankle   POLYPECTOMY  09/08/2023   Procedure: POLYPECTOMY;  Surgeon: Franky Macho, MD;  [facility];  Service: Endoscopy;;   spinal cyst removal     02/23/2012    Current Outpatient Medications  Medication Sig Dispense Refill   albuterol (VENTOLIN HFA) 108 (90 Base) MCG/ACT inhaler Inhale 2 puffs into the lungs every 6 (six) hours as needed for wheezing or shortness of breath. 8 g 0   celecoxib (CELEBREX) 200 MG capsule Take 1 capsule (200 mg total) by mouth daily. 30 capsule 3   fluticasone (FLONASE) 50 MCG/ACT nasal spray USE TWO SPRAYS IN BOTH NOSTRILS DAILY 16 g 0   levothyroxine (SYNTHROID) 25 MCG tablet TAKE ONE (1) TABLET BY MOUTH EVERY DAY 90 tablet 1   montelukast (SINGULAIR) 10 MG tablet TAKE ONE TABLET BY MOUTH EVERY NIGHT AT BEDTIME 30 tablet 3   nystatin cream (MYCOSTATIN) APPLY TO THE AFFECTED AREA(S) TWO TIMES A DAY     Omega-3 Fatty Acids (FISH OIL) 1000 MG CAPS Take 1,000 mg by mouth.     pantoprazole (PROTONIX) 40 MG tablet TAKE ONE TABLET (40MG  TOTAL) BY MOUTH DAILY 90 tablet 2  therapeutic multivitamin-minerals (THERAGRAN-M) tablet Take 1 tablet by mouth daily.     No current facility-administered medications for this visit.   Allergies:  Tape   Social History: The patient  reports that she has never smoked. She has never used smokeless tobacco. She reports current alcohol use. She reports that she does not use drugs.   Family History: The patient's family history includes Diabetes in her mother.   ROS:  Please see the history of present illness. Otherwise, complete review of systems  is positive for none.  All other systems are reviewed and negative.   Physical Exam: VS:  BP 118/78   Pulse 74   Ht 5\' 2"  (1.575 m)   Wt 147 lb 12.8 oz (67 kg)   SpO2 95%   BMI 27.03 kg/m , BMI Body mass index is 27.03 kg/m.  Wt Readings from Last 3 Encounters:  12/19/23 147 lb 12.8 oz (67 kg)  10/25/23 146 lb 3.2 oz (66.3 kg)  09/08/23 146 lb 1.6 oz (66.3 kg)    General: Patient appears comfortable at rest. HEENT: Conjunctiva and lids normal, oropharynx clear with moist mucosa. Neck: Supple, no elevated JVP or carotid bruits, no thyromegaly. Lungs: Clear to auscultation, nonlabored breathing at rest. Cardiac: Regular rate and rhythm, no S3 or significant systolic murmur, no pericardial rub. Abdomen: Soft, nontender, no hepatomegaly, bowel sounds present, no guarding or rebound. Extremities: No pitting edema, distal pulses 2+. Skin: Warm and dry. Musculoskeletal: No kyphosis. Neuropsychiatric: Alert and oriented x3, affect grossly appropriate.  ECG:  An ECG dated 12/09/2022 was personally reviewed today and demonstrated:  Normal sinus rhythm and no ST-T changes  Recent Labwork: 10/25/2023: ALT 11; AST 14; BUN 12; Creat 0.74; Hemoglobin 14.3; Platelets 330; Potassium 4.0; Sodium 141; TSH 3.30     Component Value Date/Time   CHOL 220 (H) 10/25/2023 1005   TRIG 135 10/25/2023 1005   HDL 59 10/25/2023 1005   CHOLHDL 3.7 10/25/2023 1005   VLDL 30 06/06/2019 0920   LDLCALC 135 (H) 10/25/2023 1005    Other Studies Reviewed Today:   Assessment and Plan:   Palpitations: Started in 2023 but improved in 2024.  Event monitor in 2024 did not show any evidence of pauses or malignant atrial/ventricular apneas.  She did have 28 runs of SVT, longest lasting 33.8 seconds and fastest lasting 14 beats but this was asymptomatic.  Patient was prescribed diltiazem.  But she did not take it.  No interval palpitations.  Drinks decaffeinated coffee.  No further workup is  indicated.  Self-reported history of mitral valve prolapse: No systolic murmur on exam.  No further workup.    Medication Adjustments/Labs and Tests Ordered: Current medicines are reviewed at length with the patient today.  Concerns regarding medicines are outlined above.   Tests Ordered: Orders Placed This Encounter  Procedures   EKG 12-Lead    Medication Changes: No orders of the defined types were placed in this encounter.   Disposition:  Follow up as needed  Signed, Lindaann Gradilla Verne Spurr, MD, 12/19/2023 11:00 AM    [facility] [address] [place], [place] [address]

## 2023-12-28 DIAGNOSIS — Z01419 Encounter for gynecological examination (general) (routine) without abnormal findings: Secondary | ICD-10-CM | POA: Diagnosis not present

## 2023-12-28 DIAGNOSIS — N819 Female genital prolapse, unspecified: Secondary | ICD-10-CM | POA: Diagnosis not present

## 2023-12-28 DIAGNOSIS — Z6827 Body mass index (BMI) 27.0-27.9, adult: Secondary | ICD-10-CM | POA: Diagnosis not present

## 2023-12-28 DIAGNOSIS — M545 Low back pain, unspecified: Secondary | ICD-10-CM | POA: Diagnosis not present

## 2023-12-29 DIAGNOSIS — H04123 Dry eye syndrome of bilateral lacrimal glands: Secondary | ICD-10-CM | POA: Diagnosis not present

## 2023-12-29 DIAGNOSIS — H0288B Meibomian gland dysfunction left eye, upper and lower eyelids: Secondary | ICD-10-CM | POA: Diagnosis not present

## 2023-12-29 DIAGNOSIS — H2513 Age-related nuclear cataract, bilateral: Secondary | ICD-10-CM | POA: Diagnosis not present

## 2023-12-29 DIAGNOSIS — H43393 Other vitreous opacities, bilateral: Secondary | ICD-10-CM | POA: Diagnosis not present

## 2023-12-29 DIAGNOSIS — H0288A Meibomian gland dysfunction right eye, upper and lower eyelids: Secondary | ICD-10-CM | POA: Diagnosis not present

## 2023-12-29 DIAGNOSIS — H35372 Puckering of macula, left eye: Secondary | ICD-10-CM | POA: Diagnosis not present

## 2024-01-13 ENCOUNTER — Other Ambulatory Visit: Payer: Self-pay | Admitting: Family Medicine

## 2024-01-13 DIAGNOSIS — E039 Hypothyroidism, unspecified: Secondary | ICD-10-CM

## 2024-02-18 ENCOUNTER — Other Ambulatory Visit: Payer: Self-pay | Admitting: Family Medicine

## 2024-02-21 NOTE — Telephone Encounter (Signed)
 Requested Prescriptions  Pending Prescriptions Disp Refills   celecoxib (CELEBREX) 200 MG capsule [Pharmacy Med Name: CELECOXIB 200 MG CAP] 30 capsule 3    Sig: TAKE ONE CAPSULE (200MG  TOTAL) BY MOUTH DAILY     Analgesics:  COX2 Inhibitors Failed - 02/21/2024 10:22 AM      Failed - Manual Review: Labs are only required if the patient has taken medication for more than 8 weeks.      Passed - HGB in normal range and within 360 days    Hemoglobin  Date Value Ref Range Status  10/25/2023 14.3 11.7 - 15.5 g/dL Final         Passed - Cr in normal range and within 360 days    Creat  Date Value Ref Range Status  10/25/2023 0.74 0.60 - 1.00 mg/dL Final         Passed - HCT in normal range and within 360 days    HCT  Date Value Ref Range Status  10/25/2023 42.7 35.0 - 45.0 % Final         Passed - AST in normal range and within 360 days    AST  Date Value Ref Range Status  10/25/2023 14 10 - 35 U/L Final         Passed - ALT in normal range and within 360 days    ALT  Date Value Ref Range Status  10/25/2023 11 6 - 29 U/L Final         Passed - eGFR is 30 or above and within 360 days    GFR, Est African American  Date Value Ref Range Status  09/25/2020 89 > OR = 60 mL/min/1.70m2 Final   GFR, Est Non African American  Date Value Ref Range Status  09/25/2020 77 > OR = 60 mL/min/1.73m2 Final   eGFR  Date Value Ref Range Status  10/25/2023 84 > OR = 60 mL/min/1.55m2 Final         Passed - Patient is not pregnant      Passed - Valid encounter within last 12 months    Recent Outpatient Visits           3 months ago General medical examination   [place] [facility] Pickard, Priscille Heidelberg, MD   5 months ago Esophageal dysphagia   [place] [facility] Donita Brooks, MD   1 year ago Racing heart beat   [place] [facility] Donita Brooks, MD   1 year ago Screening for malignant neoplasm of colon   [place]  [facility] Park Meo, FNP   1 year ago Acute cough    [facility] Park Meo, [place]

## 2024-02-29 DIAGNOSIS — R102 Pelvic and perineal pain: Secondary | ICD-10-CM | POA: Diagnosis not present

## 2024-03-05 DIAGNOSIS — B078 Other viral warts: Secondary | ICD-10-CM | POA: Diagnosis not present

## 2024-03-05 DIAGNOSIS — Z1283 Encounter for screening for malignant neoplasm of skin: Secondary | ICD-10-CM | POA: Diagnosis not present

## 2024-03-05 DIAGNOSIS — L84 Corns and callosities: Secondary | ICD-10-CM | POA: Diagnosis not present

## 2024-03-05 DIAGNOSIS — X32XXXD Exposure to sunlight, subsequent encounter: Secondary | ICD-10-CM | POA: Diagnosis not present

## 2024-03-05 DIAGNOSIS — D225 Melanocytic nevi of trunk: Secondary | ICD-10-CM | POA: Diagnosis not present

## 2024-03-05 DIAGNOSIS — L570 Actinic keratosis: Secondary | ICD-10-CM | POA: Diagnosis not present

## 2024-03-14 ENCOUNTER — Other Ambulatory Visit: Payer: Self-pay | Admitting: Family Medicine

## 2024-03-14 DIAGNOSIS — J40 Bronchitis, not specified as acute or chronic: Secondary | ICD-10-CM

## 2024-03-15 NOTE — Telephone Encounter (Signed)
 Requested Prescriptions  Pending Prescriptions Disp Refills   montelukast  (SINGULAIR ) 10 MG tablet [Pharmacy Med Name: MONTELUKAST  SODIUM 10 MG TAB] 30 tablet 3    Sig: TAKE ONE TABLET BY MOUTH EVERY NIGHT AT BEDTIME     Pulmonology:  Leukotriene Inhibitors Passed - 03/15/2024 10:38 AM      Passed - Valid encounter within last 12 months    Recent Outpatient Visits           4 months ago General medical examination   [place] [facility] Austine Lefort, MD   6 months ago Esophageal dysphagia   [place] [facility] Austine Lefort, MD   1 year ago Racing heart beat   [place] [facility] Austine Lefort, MD   1 year ago Screening for malignant neoplasm of colon   [place] [facility] Jenelle Mis, FNP   1 year ago Acute cough   [place] [facility] Jenelle Mis, [place]

## 2024-03-28 ENCOUNTER — Ambulatory Visit

## 2024-03-28 VITALS — Ht 62.0 in | Wt 147.0 lb

## 2024-03-28 DIAGNOSIS — Z0000 Encounter for general adult medical examination without abnormal findings: Secondary | ICD-10-CM

## 2024-03-28 DIAGNOSIS — Z780 Asymptomatic menopausal state: Secondary | ICD-10-CM

## 2024-03-28 DIAGNOSIS — Z1231 Encounter for screening mammogram for malignant neoplasm of breast: Secondary | ICD-10-CM

## 2024-03-28 NOTE — Progress Notes (Signed)
 Subjective:   Gina Gibbs is a 78 y.o. who presents for a Medicare Wellness preventive visit.  Visit Complete: Virtual I connected with  Katha Palau on 03/28/24 by a audio enabled telemedicine application and verified that I am speaking with the correct person using two identifiers.  Patient Location: Home  Provider Location: Home Office  I discussed the limitations of evaluation and management by telemedicine. The patient expressed understanding and agreed to proceed.  Vital Signs: Because this visit was a virtual/telehealth visit, some criteria may be missing or patient reported. Any vitals not documented were not able to be obtained and vitals that have been documented are patient reported.  VideoDeclined- This patient declined Librarian, academic. Therefore the visit was completed with audio only.  Persons Participating in Visit: Patient.  AWV Questionnaire: No: Patient Medicare AWV questionnaire was not completed prior to this visit.  Cardiac Risk Factors include: advanced age (>46men, >76 women);hypertension     Objective:    Today's Vitals   03/28/24 1106  Weight: 147 lb (66.7 kg)  Height: 5\' 2"  (1.575 m)   Body mass index is 26.89 kg/m.     03/28/2024   11:10 AM 09/08/2023    7:13 AM 09/07/2023   12:52 PM 12/01/2021    3:33 PM 10/05/2021    9:16 AM 01/24/2017   10:29 AM 11/05/2015    7:59 AM  Advanced Directives  Does Patient Have a Medical Advance Directive? No Yes Yes No Yes No No  Type of Special educational needs teacher of [place];Living will Healthcare Power of [place];Living will  Healthcare Power of [place];Living will    Does patient want to make changes to medical advance directive?   No - Patient declined      Copy of Healthcare Power of Attorney in Chart?     No - copy requested    Would patient like information on creating a medical advance directive? Yes (MAU/Ambulatory/Procedural Areas - Information given)  Yes  (MAU/Ambulatory/Procedural Areas - Information given) No - Patient declined   No - patient declined information    Current Medications (verified) Outpatient Encounter Medications as of 03/28/2024  Medication Sig   albuterol  (VENTOLIN  HFA) 108 (90 Base) MCG/ACT inhaler Inhale 2 puffs into the lungs every 6 (six) hours as needed for wheezing or shortness of breath.   celecoxib  (CELEBREX ) 200 MG capsule TAKE ONE CAPSULE (200MG  TOTAL) BY MOUTH DAILY   fluticasone  (FLONASE ) 50 MCG/ACT nasal spray USE TWO SPRAYS IN BOTH NOSTRILS DAILY   levothyroxine  (SYNTHROID ) 25 MCG tablet TAKE ONE (1) TABLET BY MOUTH EVERY DAY   montelukast  (SINGULAIR ) 10 MG tablet TAKE ONE TABLET BY MOUTH EVERY NIGHT AT BEDTIME   nystatin cream (MYCOSTATIN) APPLY TO THE AFFECTED AREA(S) TWO TIMES A DAY   Omega-3 Fatty Acids (FISH OIL) 1000 MG CAPS Take 1,000 mg by mouth.   pantoprazole  (PROTONIX ) 40 MG tablet TAKE ONE TABLET (40MG  TOTAL) BY MOUTH DAILY   therapeutic multivitamin-minerals (THERAGRAN-M) tablet Take 1 tablet by mouth daily.   No facility-administered encounter medications on file as of 03/28/2024.    Allergies (verified) Tape   History: Past Medical History:  Diagnosis Date   Arthritis    Cataract    Mixed forms OU   Complication of anesthesia    GERD (gastroesophageal reflux disease) 02/28/2013   Hyperlipidemia 02/28/2013   Hypothyroid    Hypothyroid 09/26/2013   Hypothyroidism 03/31/2020   Macular degeneration    non-exu OU   Osteoporosis  PONV (postoperative nausea and vomiting)    Past Surgical History:  Procedure Laterality Date   ABDOMINAL HYSTERECTOMY     BALLOON DILATION  09/08/2023   Procedure: BALLOON DILATION;  Surgeon: Hargis Lias, MD;  [facility];  Service: Endoscopy;;   BIOPSY  09/08/2023   Procedure: BIOPSY;  Surgeon: Hargis Lias, MD;  [facility];  Service: Endoscopy;;   BREAST BIOPSY Right 10/09/2021   Benign Breast tissue with increased  stromal fibrosis and adenosis with calcifications- No malignancy identfied.   EAR CYST EXCISION  02/23/2012   Procedure: CYST REMOVAL;  Surgeon: Kemp Patter, MD;  Location: [facility];  Service: Orthopedics;  Laterality: Left;  debridement distal interphalangeal left index finger, excision cyst/loose body   ESOPHAGOGASTRODUODENOSCOPY (EGD) WITH PROPOFOL  N/A 09/08/2023   Procedure: ESOPHAGOGASTRODUODENOSCOPY (EGD) WITH PROPOFOL ;  Surgeon: Hargis Lias, MD;  [facility];  Service: Endoscopy;  Laterality: N/A;  8:30AM;ASA 1-2   FRACTURE SURGERY  09/2013   right ankle   POLYPECTOMY  09/08/2023   Procedure: POLYPECTOMY;  Surgeon: Hargis Lias, MD;  [facility];  Service: Endoscopy;;   spinal cyst removal     02/23/2012   Family History  Problem Relation Age of Onset   Diabetes Mother    Social History   Socioeconomic History   Marital status: Married    Spouse name: Not on file   Number of children: Not on file   Years of education: Not on file   Highest education level: Not on file  Occupational History   Occupation: retired  Tobacco Use   Smoking status: Never   Smokeless tobacco: Never  Vaping Use   Vaping status: Never Used  Substance and Sexual Activity   Alcohol use: Yes    Comment: rare   Drug use: No   Sexual activity: Never    Partners: Male    Birth control/protection: Surgical    Comment: hyst  Other Topics Concern   Not on file  Social History Narrative   Not on file   Social Drivers of Health   Financial Resource Strain: Low Risk  (03/28/2024)   Overall Financial Resource Strain (CARDIA)    Difficulty of Paying Living Expenses: Not hard at all  Food Insecurity: No Food Insecurity (03/28/2024)   Hunger Vital Sign    Worried About Running Out of Food in the Last Year: Never true    Ran Out of Food in the Last Year: Never true  Transportation Needs: No Transportation Needs (03/28/2024)   PRAPARE - Therapist, art (Medical): No    Lack of Transportation (Non-Medical): No  Physical Activity: Insufficiently Active (03/28/2024)   Exercise Vital Sign    Days of Exercise per Week: 3 days    Minutes of Exercise per Session: 30 min  Stress: No Stress Concern Present (03/28/2024)   [employer] of Occupational Health - Occupational Stress Questionnaire    Feeling of Stress : Not at all  Social Connections: Socially Integrated (03/28/2024)   Social Connection and Isolation Panel [NHANES]    Frequency of Communication with Friends and Family: More than three times a week    Frequency of Social Gatherings with Friends and Family: Three times a week    Attends Religious Services: More than 4 times per year    Active Member of Clubs or Organizations: Yes    Attends Banker Meetings: More than 4 times per  year    Marital Status: Married    Tobacco Counseling Counseling given: Not Answered    Clinical Intake:  Pre-visit preparation completed: Yes  Pain : No/denies pain     Diabetes: No  Lab Results  Component Value Date   HGBA1C 5.4 06/05/2018     How often do you need to have someone help you when you read instructions, pamphlets, or other written materials from your doctor or pharmacy?: 1 - Never  Interpreter Needed?: No  Information entered by :: Seabron Cypress LPN   Activities of Daily Living     03/28/2024   11:10 AM 09/07/2023   12:55 PM  In your present state of health, do you have any difficulty performing the following activities:  Hearing? 0   Vision? 0   Difficulty concentrating or making decisions? 0   Walking or climbing stairs? 0   Dressing or bathing? 0   Doing errands, shopping? 0 0  Preparing Food and eating ? N   Using the Toilet? N   In the past six months, have you accidently leaked urine? N   Do you have problems with loss of bowel control? N   Managing your Medications? N   Managing your Finances? N   Housekeeping or  managing your Housekeeping? N     Patient Care Team: Austine Lefort, MD as PCP - General (Family Medicine) Mallipeddi, Kennyth Pean, MD as PCP - Cardiology (Cardiology)  Indicate any recent Medical Services you may have received from other than [facility] providers in the past year (date may be approximate).     Assessment:   This is a routine wellness examination for [place].  Hearing/Vision screen Hearing Screening - Comments:: Denies hearing difficulties   Vision Screening - Comments:: Wears rx glasses - up to date with routine eye exams with [facility]    Goals Addressed             This Visit's Progress    Remain active and independent         Depression Screen     03/28/2024   11:08 AM 10/25/2023    9:52 AM 10/19/2022    9:28 AM 09/07/2022    1:57 PM 08/31/2022   12:01 PM 08/31/2022   11:51 AM 10/05/2021    9:21 AM  PHQ 2/9 Scores  PHQ - 2 Score 0 0 0 0 0 0 0  PHQ- 9 Score   0 0 1 0 0    Fall Risk     03/28/2024   11:10 AM 10/25/2023    9:52 AM 10/19/2022    9:28 AM 09/07/2022    1:57 PM 08/31/2022   12:00 PM  Fall Risk   Falls in the past year? 0 0 0 0 1  Number falls in past yr: 0 0 0 0 0  Injury with Fall? 0 0 0 0 1  Comment     small laceration to face Jan. 2023  Risk for fall due to : No Fall Risks No Fall Risks No Fall Risks  Impaired mobility  Follow up Falls prevention discussed;Education provided;Falls evaluation completed Falls prevention discussed Falls prevention discussed      MEDICARE RISK AT HOME:  Medicare Risk at Home Any stairs in or around the home?: No If so, are there any without handrails?: No Home free of loose throw rugs in walkways, pet beds, electrical cords, etc?: Yes Adequate lighting in your home to reduce risk of falls?: Yes Life alert?: No Use  of a cane, walker or w/c?: No Grab bars in the bathroom?: Yes Shower chair or bench in shower?: No Elevated toilet seat or a handicapped toilet?: Yes  TIMED UP AND GO:  Was the  test performed?  No  Cognitive Function: 6CIT completed        03/28/2024   11:10 AM  6CIT Screen  What Year? 0 points  What month? 0 points  What time? 0 points  Count back from 20 0 points  Months in reverse 0 points  Repeat phrase 0 points  Total Score 0 points    Immunizations Immunization History  Administered Date(s) Administered   Fluad Quad(high Dose 65+) 10/01/2019, 10/02/2020, 10/05/2021, 10/19/2022   Fluad Trivalent(High Dose 65+) 10/25/2023   Influenza-Unspecified 11/10/2016, 12/12/2017   Moderna Covid-19 Vaccine Bivalent Booster 57yrs & up 09/03/2021   Moderna Sars-Covid-2 Vaccination 01/05/2020, 02/03/2020, 10/09/2020   Pneumococcal Conjugate-13 05/22/2018   Pneumococcal Polysaccharide-23 09/20/2013, 11/10/2016   Tdap 05/01/2014, 12/01/2021   Unspecified SARS-COV-2 Vaccination 02/21/2020   Zoster Recombinant(Shingrix) 07/14/2018, 08/23/2019   Zoster, Live 03/13/2014    Screening Tests Health Maintenance  Topic Date Due   COVID-19 Vaccine (6 - 2024-25 season) 07/24/2023   INFLUENZA VACCINE  06/22/2024   Medicare Annual Wellness (AWV)  03/28/2025   Fecal DNA (Cologuard)  10/20/2025   DTaP/Tdap/Td (3 - Td or Tdap) 12/02/2031   Pneumonia Vaccine 57+ Years old  Completed   DEXA SCAN  Completed   Hepatitis C Screening  Completed   Zoster Vaccines- Shingrix  Completed   HPV VACCINES  Aged Out   Meningococcal B Vaccine  Aged Out    Health Maintenance  Health Maintenance Due  Topic Date Due   COVID-19 Vaccine (6 - 2024-25 season) 07/24/2023   Health Maintenance Items Addressed: Mammogram ordered, DEXA scheduled  Additional Screening:  Vision Screening: Recommended annual ophthalmology exams for early detection of glaucoma and other disorders of the eye.  Dental Screening: Recommended annual dental exams for proper oral hygiene  Community Resource Referral / Chronic Care Management: CRR required this visit?  No   CCM required this visit?  No      Plan:     I have personally reviewed and noted the following in the patient's chart:   Medical and social history Use of alcohol, tobacco or illicit drugs  Current medications and supplements including opioid prescriptions. Patient is not currently taking opioid prescriptions. Functional ability and status Nutritional status Physical activity Advanced directives List of other physicians Hospitalizations, surgeries, and ER visits in previous 12 months Vitals Screenings to include cognitive, depression, and falls Referrals and appointments  In addition, I have reviewed and discussed with patient certain preventive protocols, quality metrics, and best practice recommendations. A written personalized care plan for preventive services as well as general preventive health recommendations were provided to patient.     Seabron Cypress [place], [place]   06/22/174   After Visit Summary: (MyChart) Due to this being a telephonic visit, the after visit summary with patients personalized plan was offered to patient via MyChart   Notes: Nothing significant to report at this time.

## 2024-03-28 NOTE — Patient Instructions (Signed)
 Gina Gibbs , Thank you for taking time to come for your Medicare Wellness Visit. I appreciate your ongoing commitment to your health goals. Please review the following plan we discussed and let me know if I can assist you in the future.   Referrals/Orders/Follow-Ups/Clinician Recommendations: Aim for 30 minutes of exercise or brisk walking, 6-8 glasses of water, and 5 servings of fruits and vegetables each day.  You have an order for:  []   2D Mammogram  [x]   3D Mammogram  [x]   Bone Density     Please call for appointment:   [facility] [address] -Radiology [place], [place] [address] [phone]  Make sure to wear two-piece clothing.  No lotions, powders, or deodorants the day of the appointment. Make sure to bring picture ID and insurance card.  Bring list of medications you are currently taking including any supplements.    This is a list of the screening recommended for you and due dates:  Health Maintenance  Topic Date Due   COVID-19 Vaccine (6 - 2024-25 season) 07/24/2023   Flu Shot  06/22/2024   Medicare Annual Wellness Visit  03/28/2025   Cologuard (Stool DNA test)  10/20/2025   DTaP/Tdap/Td vaccine (3 - Td or Tdap) 12/02/2031   Pneumonia Vaccine  Completed   DEXA scan (bone density measurement)  Completed   Hepatitis C Screening  Completed   Zoster (Shingles) Vaccine  Completed   HPV Vaccine  Aged Out   Meningitis B Vaccine  Aged Out    Advanced directives: (ACP Link)Information on Advanced Care Planning can be found at [place]  Secretary of [facility] Advance Health Care Directives Advance Health Care Directives. [email or web address]   Next Medicare Annual Wellness Visit scheduled for next year: Yes  Have you seen your provider in the last 6 months (3 months if uncontrolled diabetes)? Yes

## 2024-04-02 ENCOUNTER — Ambulatory Visit: Payer: Self-pay | Admitting: *Deleted

## 2024-04-02 NOTE — Telephone Encounter (Signed)
 Copied from CRM [id number]. Topic: Clinical - Red Word Triage >> Apr 02, 2024  3:43 PM Lotus Round B wrote: [facility] that prompted transfer to Nurse Triage: very swollen elbow (left) Reason for Disposition  Swollen joint  Answer Assessment - Initial Assessment Questions 1. ONSET: "When did the pain start?"     My left elbow is red and swollen and very painful.   Noticed it last night. 2. LOCATION: "Where is the pain located?"     above 3. PAIN: "How bad is the pain?" (Scale 1-10; or mild, moderate, severe)   - MILD (1-3): doesn't interfere with normal activities.   - MODERATE (4-7): interferes with normal activities (e.g., work or school) or awakens from sleep.   - SEVERE (8-10): excruciating pain, unable to do any normal activities, unable to use arm at all.     It woke me up during the night.    4. WORK OR EXERCISE: "Has there been any recent work or exercise that involved this part of the body?"     No 5. CAUSE: "What do you think is causing the elbow pain?"     I don't know 6. OTHER SYMPTOMS: "Do you have any other symptoms?" (e.g., neck pain, elbow swelling, rash, fever)     above 7. PREGNANCY: "Is there any chance you are pregnant?" "When was your last menstrual period?"     N/A  Protocols used: Elbow Pain-A-AH  Chief Complaint: Left elbow is red, swollen and painful Symptoms: above Frequency: Noticed it last night Pertinent Negatives: Patient denies No history of gout but does have arthritis in other joints. Disposition: [] ED /[] Urgent Care (no appt availability in office) / [x] Appointment(In office/virtual)/ []  [place] [facility]/ [] Home Care/ [] Refused Recommended Disposition /[] [facility]/ []  Follow-up with PCP Additional Notes: Appt made for 04/03/2024 with Dr. Ferna How for 10:00.

## 2024-04-03 ENCOUNTER — Ambulatory Visit: Admitting: Family Medicine

## 2024-04-03 ENCOUNTER — Encounter: Payer: Self-pay | Admitting: Family Medicine

## 2024-04-03 VITALS — BP 120/74 | HR 73 | Temp 98.5°F | Ht 62.0 in | Wt 147.4 lb

## 2024-04-03 DIAGNOSIS — M25522 Pain in left elbow: Secondary | ICD-10-CM

## 2024-04-03 DIAGNOSIS — L03114 Cellulitis of left upper limb: Secondary | ICD-10-CM

## 2024-04-03 DIAGNOSIS — K219 Gastro-esophageal reflux disease without esophagitis: Secondary | ICD-10-CM | POA: Diagnosis not present

## 2024-04-03 MED ORDER — CEPHALEXIN 500 MG PO CAPS: 500.0000 mg | ORAL_CAPSULE | Freq: Four times a day (QID) | ORAL | 0 refills | Status: DC

## 2024-04-03 NOTE — Progress Notes (Signed)
 Patient Office Visit  Assessment & Plan:   Left elbow pain -     Cephalexin ; Take 1 capsule (500 mg total) by mouth 4 (four) times daily.  Dispense: 40 capsule; Refill: 0  Gastroesophageal reflux disease, unspecified whether esophagitis present  Cellulitis of left elbow -     Cephalexin ; Take 1 capsule (500 mg total) by mouth 4 (four) times daily.  Dispense: 40 capsule; Refill: 0   Assessment and Plan    Cellulitis of left elbow Redness and warmth suggest cellulitis. No trauma, insect bite, or fever. Infection likely. Oral antibiotics chosen. - Prescribe oral antibiotics. - Send prescription to [facility].  Medial epicondylitis Pain in medial left elbow, consistent with medial epicondylitis. Inflammatory. Celebrex  providing some relief. - Recommend Voltaren gel for topical pain relief. - Continue Celebrex .  Gastro-esophageal reflux disease GERD managed with Protonix . - Continue Protonix  as prescribed.       Return if symptoms worsen or fail to improve.   Subjective:     Patient ID: Gina Gibbs, female    DOB: [birth year]  Age: 78 y.o. MRN: [id number]  Chief Complaint  Patient presents with   Elbow Pain    Left elbow pain/swelling since Sunday.     HPI Discussed the use of AI scribe software for clinical note transcription with the patient, who gave verbal consent to proceed.  History of Present Illness   Gina Gibbs is a 78 year old female who presents with left elbow pain and redness.  She began experiencing pain in her left elbow on Sunday night, initially noticing discomfort while resting her arm on a chair armrest. The pain was significant enough to disturb her sleep, and she observed redness in the area. No recent trauma, falls, or insect bites. She is right-handed and has not engaged in any outdoor work or sports that might have contributed to the symptoms.  The elbow feels puffy, but she has not experienced any fever or chills. She has not  applied any topical treatments or taken any medications specifically for the elbow pain. She has been using Celebrex , an anti-inflammatory, for the past few months. She has not used Voltaren gel or Tylenol  for this issue.  Her medical history includes the use of Protonix  for reflux. She denies any allergies to medications and does not take blood thinners. 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          The 10-year ASCVD risk score (Arnett DK, et al., 2019) is: 14.4%  Past Medical History:  Diagnosis Date   Arthritis    Cataract    Mixed forms OU   Complication of anesthesia    GERD (gastroesophageal reflux disease) 02/28/2013   Hyperlipidemia 02/28/2013   Hypothyroid    Hypothyroid 09/26/2013   Hypothyroidism 03/31/2020   Macular degeneration    non-exu OU   Osteoporosis    PONV (postoperative nausea and vomiting)    Past Surgical History:  Procedure Laterality Date   ABDOMINAL HYSTERECTOMY     BALLOON DILATION  09/08/2023   Procedure: BALLOON DILATION;  Surgeon: Hargis Lias, MD;  [facility];  Service: Endoscopy;;   BIOPSY  09/08/2023   Procedure: BIOPSY;  Surgeon: Hargis Lias, MD;  [facility];  Service: Endoscopy;;   BREAST BIOPSY Right 10/09/2021   Benign Breast tissue with increased stromal fibrosis and adenosis with calcifications- No malignancy identfied.   EAR CYST EXCISION  02/23/2012   Procedure: CYST REMOVAL;  Surgeon: Kemp Patter, MD;  Location:  SURGERY CENTER;  Service: Orthopedics;  Laterality: Left;  debridement distal interphalangeal left index finger, excision cyst/loose body   ESOPHAGOGASTRODUODENOSCOPY (EGD) WITH PROPOFOL  N/A 09/08/2023   Procedure: ESOPHAGOGASTRODUODENOSCOPY (EGD) WITH PROPOFOL ;  Surgeon: Hargis Lias, MD;  [facility];  Service: Endoscopy;  Laterality: N/A;  8:30AM;ASA 1-2   FRACTURE SURGERY  09/2013    right ankle   POLYPECTOMY  09/08/2023   Procedure: POLYPECTOMY;  Surgeon: Hargis Lias, MD;  [facility];  Service: Endoscopy;;   spinal cyst removal     02/23/2012   Social History   Tobacco Use   Smoking status: Never   Smokeless tobacco: Never  Vaping Use   Vaping status: Never Used  Substance Use Topics   Alcohol use: Yes    Comment: rare   Drug use: No   Family History  Problem Relation Age of Onset   Diabetes Mother    Allergies  Allergen Reactions   Tape     adhesives    ROS    Objective:    BP 120/74   Pulse 73   Temp 98.5 F (36.9 C)   Ht 5\' 2"  (1.575 m)   Wt 147 lb 6 oz (66.8 kg)   SpO2 99%   BMI 26.96 kg/m  BP Readings from Last 3 Encounters:  04/03/24 120/74  12/19/23 118/78  10/25/23 120/62   Wt Readings from Last 3 Encounters:  04/03/24 147 lb 6 oz (66.8 kg)  03/28/24 147 lb (66.7 kg)  12/19/23 147 lb 12.8 oz (67 kg)    Physical Exam Vitals and nursing note reviewed.  Constitutional:      Appearance: Normal appearance.  HENT:     Head: Normocephalic.     Right Ear: Tympanic membrane, ear canal and external ear normal.     Left Ear: Tympanic membrane, ear canal and external ear normal.  Eyes:     Extraocular Movements: Extraocular movements intact.     Conjunctiva/sclera: Conjunctivae normal.     Pupils: Pupils are equal, round, and reactive to light.  Cardiovascular:     Rate and Rhythm: Normal rate and regular rhythm.     Heart sounds: Normal heart sounds.  Pulmonary:     Effort: Pulmonary effort is normal.     Breath sounds: Normal breath sounds.  Musculoskeletal:     Left elbow: Swelling present. Tenderness present in medial epicondyle.     Right lower leg: No edema.     Left lower leg: No edema.  Neurological:     General: No focal deficit present.     Mental Status: She is alert and oriented to person, place, and time.  Psychiatric:        Mood and Affect: Mood normal.        Behavior: Behavior  normal.        Thought Content: Thought content normal.        Judgment: Judgment normal.      No results found for any visits on 04/03/24.

## 2024-05-07 DIAGNOSIS — N760 Acute vaginitis: Secondary | ICD-10-CM | POA: Diagnosis not present

## 2024-05-07 DIAGNOSIS — Z4689 Encounter for fitting and adjustment of other specified devices: Secondary | ICD-10-CM | POA: Diagnosis not present

## 2024-07-10 DIAGNOSIS — Z4689 Encounter for fitting and adjustment of other specified devices: Secondary | ICD-10-CM | POA: Diagnosis not present

## 2024-07-11 ENCOUNTER — Other Ambulatory Visit: Payer: Self-pay | Admitting: Family Medicine

## 2024-07-11 DIAGNOSIS — E039 Hypothyroidism, unspecified: Secondary | ICD-10-CM

## 2024-07-11 DIAGNOSIS — J40 Bronchitis, not specified as acute or chronic: Secondary | ICD-10-CM

## 2024-07-12 NOTE — Telephone Encounter (Signed)
 Requested Prescriptions  Pending Prescriptions Disp Refills   montelukast  (SINGULAIR ) 10 MG tablet [Pharmacy Med Name: MONTELUKAST  SODIUM 10 MG TAB] 30 tablet 3    Sig: TAKE ONE TABLET BY MOUTH EVERY NIGHT AT BEDTIME     Pulmonology:  Leukotriene Inhibitors Passed - 07/12/2024  2:40 PM      Passed - Valid encounter within last 12 months    Recent Outpatient Visits           3 months ago Left elbow pain   [facility] Aletha Bene, MD   8 months ago General medical examination   [facility] [facility] Duanne Butler DASEN, MD   10 months ago Esophageal dysphagia   [place] [facility] Duanne, Butler DASEN, MD   1 year ago Racing heart beat   [place] [facility] Duanne Butler DASEN, MD   1 year ago Screening for malignant neoplasm of colon   [place] [facility] Kayla Jeoffrey RAMAN, FNP

## 2024-07-17 ENCOUNTER — Other Ambulatory Visit: Payer: Self-pay | Admitting: Family Medicine

## 2024-07-20 ENCOUNTER — Other Ambulatory Visit: Payer: Self-pay | Admitting: Family Medicine

## 2024-07-20 DIAGNOSIS — E039 Hypothyroidism, unspecified: Secondary | ICD-10-CM

## 2024-07-20 NOTE — Telephone Encounter (Signed)
 Copied from CRM #[id number]. Topic: Clinical - Medication Refill >> Jul 20, 2024  9:39 AM Larissa RAMAN wrote: Medication: levothyroxine  (SYNTHROID ) 25 MCG tablet  Has the patient contacted their pharmacy? Yes, Pharmacy calling on behalf of patient  (Agent: If no, request that the patient contact the pharmacy for the refill. If patient does not wish to contact the pharmacy document the reason why and proceed with request.) (Agent: If yes, when and what did the pharmacy advise?)  This is the patient's preferred pharmacy:  [facility] - [place], [place] - [address]  [place] [address] Phone: [phone] Fax: [phone]  Is this the correct pharmacy for this prescription? Yes If no, delete pharmacy and type the correct one.   Has the prescription been filled recently? No  Is the patient out of the medication? Yes  Has the patient been seen for an appointment in the last year OR does the patient have an upcoming appointment? Yes  Can we respond through MyChart? No  Agent: Please be advised that Rx refills may take up to 3 business days. We ask that you follow-up with your pharmacy.

## 2024-07-22 MED ORDER — LEVOTHYROXINE SODIUM 25 MCG PO TABS: 25.0000 ug | ORAL_TABLET | Freq: Every day | ORAL | 1 refills | Status: AC

## 2024-07-22 NOTE — Telephone Encounter (Signed)
 Requested Prescriptions  Pending Prescriptions Disp Refills   levothyroxine  (SYNTHROID ) 25 MCG tablet 90 tablet 1    Sig: Take 1 tablet (25 mcg total) by mouth daily.     Endocrinology:  Hypothyroid Agents Passed - 07/22/2024 11:34 AM      Passed - TSH in normal range and within 360 days    TSH  Date Value Ref Range Status  10/25/2023 3.30 0.40 - 4.50 mIU/L Final         Passed - Valid encounter within last 12 months    Recent Outpatient Visits           3 months ago Left elbow pain   [place] [facility] Aletha Bene, MD   9 months ago General medical examination   [place] [facility] Duanne Butler DASEN, MD   10 months ago Esophageal dysphagia   [place] [facility] Duanne Butler DASEN, MD   1 year ago Racing heart beat   [place] [facility] Duanne Butler DASEN, MD   1 year ago Screening for malignant neoplasm of colon    [facility] Kayla Jeoffrey RAMAN, FNP

## 2024-08-06 ENCOUNTER — Other Ambulatory Visit: Payer: Self-pay | Admitting: Family Medicine

## 2024-08-06 DIAGNOSIS — K219 Gastro-esophageal reflux disease without esophagitis: Secondary | ICD-10-CM

## 2024-09-06 ENCOUNTER — Other Ambulatory Visit

## 2024-09-06 DIAGNOSIS — R300 Dysuria: Secondary | ICD-10-CM | POA: Diagnosis not present

## 2024-09-07 ENCOUNTER — Ambulatory Visit: Payer: Self-pay | Admitting: Family Medicine

## 2024-09-07 ENCOUNTER — Other Ambulatory Visit: Payer: Self-pay | Admitting: Family Medicine

## 2024-09-07 LAB — URINALYSIS, ROUTINE W REFLEX MICROSCOPIC
Bilirubin Urine: NEGATIVE
Glucose, UA: NEGATIVE
Hyaline Cast: NONE SEEN /LPF
Ketones, ur: NEGATIVE
Nitrite: NEGATIVE
Specific Gravity, Urine: 1.022 (ref 1.001–1.035)
WBC, UA: >=60 /HPF — AB (ref 0–5)
pH: 5.5 (ref 5.0–8.0)

## 2024-09-07 LAB — URINE CULTURE
MICRO NUMBER:: 17108416
SPECIMEN QUALITY:: ADEQUATE

## 2024-09-07 MED ORDER — SULFAMETHOXAZOLE-TRIMETHOPRIM 800-160 MG PO TABS: 1.0000 | ORAL_TABLET | Freq: Two times a day (BID) | ORAL | 0 refills | Status: DC

## 2024-09-10 DIAGNOSIS — Z4689 Encounter for fitting and adjustment of other specified devices: Secondary | ICD-10-CM | POA: Diagnosis not present

## 2024-09-26 DIAGNOSIS — J301 Allergic rhinitis due to pollen: Secondary | ICD-10-CM | POA: Diagnosis not present

## 2024-09-26 DIAGNOSIS — Z791 Long term (current) use of non-steroidal anti-inflammatories (NSAID): Secondary | ICD-10-CM | POA: Diagnosis not present

## 2024-09-26 DIAGNOSIS — K219 Gastro-esophageal reflux disease without esophagitis: Secondary | ICD-10-CM | POA: Diagnosis not present

## 2024-09-26 DIAGNOSIS — H269 Unspecified cataract: Secondary | ICD-10-CM | POA: Diagnosis not present

## 2024-09-26 DIAGNOSIS — Z833 Family history of diabetes mellitus: Secondary | ICD-10-CM | POA: Diagnosis not present

## 2024-09-26 DIAGNOSIS — E039 Hypothyroidism, unspecified: Secondary | ICD-10-CM | POA: Diagnosis not present

## 2024-09-26 DIAGNOSIS — Z8249 Family history of ischemic heart disease and other diseases of the circulatory system: Secondary | ICD-10-CM | POA: Diagnosis not present

## 2024-09-26 DIAGNOSIS — M810 Age-related osteoporosis without current pathological fracture: Secondary | ICD-10-CM | POA: Diagnosis not present

## 2024-09-26 DIAGNOSIS — M1990 Unspecified osteoarthritis, unspecified site: Secondary | ICD-10-CM | POA: Diagnosis not present

## 2024-10-12 ENCOUNTER — Other Ambulatory Visit (HOSPITAL_COMMUNITY)

## 2024-10-12 ENCOUNTER — Ambulatory Visit (HOSPITAL_COMMUNITY)

## 2024-10-15 ENCOUNTER — Ambulatory Visit (HOSPITAL_COMMUNITY)
Admission: RE | Admit: 2024-10-15 | Discharge: 2024-10-15 | Disposition: A | Source: Ambulatory Visit | Attending: Family Medicine | Admitting: Family Medicine

## 2024-10-15 ENCOUNTER — Ambulatory Visit: Payer: Self-pay | Admitting: Family Medicine

## 2024-10-15 ENCOUNTER — Ambulatory Visit (HOSPITAL_COMMUNITY)
Admission: RE | Admit: 2024-10-15 | Discharge: 2024-10-15 | Disposition: A | Discharge status: Home / Self Care | Source: Ambulatory Visit | Attending: Family Medicine | Admitting: Family Medicine

## 2024-10-15 DIAGNOSIS — Z1231 Encounter for screening mammogram for malignant neoplasm of breast: Secondary | ICD-10-CM | POA: Diagnosis not present

## 2024-10-15 DIAGNOSIS — Z780 Asymptomatic menopausal state: Secondary | ICD-10-CM | POA: Insufficient documentation

## 2024-10-15 DIAGNOSIS — M8589 Other specified disorders of bone density and structure, multiple sites: Secondary | ICD-10-CM | POA: Diagnosis not present

## 2024-10-25 ENCOUNTER — Encounter: Payer: Self-pay | Admitting: Family Medicine

## 2024-10-25 ENCOUNTER — Ambulatory Visit: Payer: Medicare PPO | Admitting: Family Medicine

## 2024-10-25 VITALS — BP 132/82 | HR 70 | Temp 97.8°F | Ht 62.0 in | Wt 144.8 lb

## 2024-10-25 DIAGNOSIS — M810 Age-related osteoporosis without current pathological fracture: Secondary | ICD-10-CM

## 2024-10-25 DIAGNOSIS — Z13220 Encounter for screening for lipoid disorders: Secondary | ICD-10-CM

## 2024-10-25 DIAGNOSIS — Z0000 Encounter for general adult medical examination without abnormal findings: Secondary | ICD-10-CM

## 2024-10-25 DIAGNOSIS — Z23 Encounter for immunization: Secondary | ICD-10-CM

## 2024-10-25 DIAGNOSIS — R350 Frequency of micturition: Secondary | ICD-10-CM | POA: Diagnosis not present

## 2024-10-25 DIAGNOSIS — E039 Hypothyroidism, unspecified: Secondary | ICD-10-CM

## 2024-10-25 MED ORDER — FLUTICASONE PROPIONATE 50 MCG/ACT NA SUSP: 2.0000 | Freq: Every day | NASAL | 6 refills | Status: DC

## 2024-10-25 MED ORDER — FLUTICASONE PROPIONATE 50 MCG/ACT NA SUSP: 2.0000 | Freq: Every day | NASAL | 0 refills | Status: AC

## 2024-10-25 MED ORDER — HYDROCODONE BIT-HOMATROP MBR 5-1.5 MG/5ML PO SOLN: 5.0000 mL | Freq: Three times a day (TID) | ORAL | 0 refills | Status: AC | PRN

## 2024-10-25 NOTE — Progress Notes (Signed)
 Subjective:    Patient ID: Gina Gibbs, female    DOB: 1946-05-02, 78 y.o.   MRN: [id number]  HPI Patient is a 78 year old Caucasian female here today for complete physical exam.  Cologuard was negative 11/23.  Mammogram was normal 11/25.  Dexa showed osteopenia in 2025 with a T-score of -2 in the left femur.  She is due for a flu shot, she is due for a COVID shot, she is due for an RSV shot.  She also reports a 2-week history of cough that is nonproductive.  She denies any fevers or chills.  She denies any chest pain or pleurisy.  She denies any hemoptysis.  She does report significant postnasal drip and rhinorrhea.  She is taking Singulair  and Zyrtec with minimal relief.  She denies any sinus pain.  She denies any headaches.  She does report pelvic pressure.  She denies any urinary frequency or urgency or dysuria Immunization History  Administered Date(s) Administered   Fluad Quad(high Dose 65+) 10/01/2019, 10/02/2020, 10/05/2021, 10/19/2022   Fluad Trivalent(High Dose 65+) 10/25/2023   Influenza-Unspecified 11/10/2016, 12/12/2017   Moderna Covid-19 Vaccine Bivalent Booster 59yrs & up 09/03/2021   Moderna Sars-Covid-2 Vaccination 01/05/2020, 02/03/2020, 10/09/2020   Pneumococcal Conjugate-13 05/22/2018   Pneumococcal Polysaccharide-23 09/20/2013, 11/10/2016   Tdap 05/01/2014, 12/01/2021   Unspecified SARS-COV-2 Vaccination 02/21/2020   Zoster Recombinant(Shingrix) 07/14/2018, 08/23/2019   Zoster, Live 03/13/2014    Past Medical History:  Diagnosis Date   Arthritis    Cataract    Mixed forms OU   Complication of anesthesia    GERD (gastroesophageal reflux disease) 02/28/2013   Hyperlipidemia 02/28/2013   Hypothyroid    Hypothyroid 09/26/2013   Hypothyroidism 03/31/2020   Macular degeneration    non-exu OU   Osteoporosis    PONV (postoperative nausea and vomiting)    Past Surgical History:  Procedure Laterality Date   ABDOMINAL HYSTERECTOMY     BALLOON DILATION   09/08/2023   Procedure: BALLOON DILATION;  Surgeon: Cinderella Deatrice FALCON, MD;  [facility];  Service: Endoscopy;;   BIOPSY  09/08/2023   Procedure: BIOPSY;  Surgeon: Cinderella Deatrice FALCON, MD;  [facility];  Service: Endoscopy;;   BREAST BIOPSY Right 10/09/2021   Benign Breast tissue with increased stromal fibrosis and adenosis with calcifications- No malignancy identfied.   EAR CYST EXCISION  02/23/2012   Procedure: CYST REMOVAL;  Surgeon: Arley JONELLE Curia, MD;  [facility];  Service: Orthopedics;  Laterality: Left;  debridement distal interphalangeal left index finger, excision cyst/loose body   ESOPHAGOGASTRODUODENOSCOPY (EGD) WITH PROPOFOL  N/A 09/08/2023   Procedure: ESOPHAGOGASTRODUODENOSCOPY (EGD) WITH PROPOFOL ;  Surgeon: Cinderella Deatrice FALCON, MD;  [facility];  Service: Endoscopy;  Laterality: N/A;  8:30AM;ASA 1-2   FRACTURE SURGERY  09/2013   right ankle   POLYPECTOMY  09/08/2023   Procedure: POLYPECTOMY;  Surgeon: Cinderella Deatrice FALCON, MD;  [facility];  Service: Endoscopy;;   spinal cyst removal     02/23/2012   Current Outpatient Medications on File Prior to Visit  Medication Sig Dispense Refill   albuterol  (VENTOLIN  HFA) 108 (90 Base) MCG/ACT inhaler Inhale 2 puffs into the lungs every 6 (six) hours as needed for wheezing or shortness of breath. 8 g 0   celecoxib  (CELEBREX ) 200 MG capsule TAKE ONE CAPSULE (200MG  TOTAL) BY MOUTH DAILY 90 capsule 2   levothyroxine  (SYNTHROID ) 25 MCG tablet Take 1 tablet (25 mcg total) by mouth daily. 90 tablet 1  montelukast  (SINGULAIR ) 10 MG tablet TAKE ONE TABLET BY MOUTH EVERY NIGHT AT BEDTIME 30 tablet 3   Omega-3 Fatty Acids (FISH OIL) 1000 MG CAPS Take 1,000 mg by mouth.     pantoprazole  (PROTONIX ) 40 MG tablet TAKE ONE TABLET (40MG  TOTAL) BY MOUTH DAILY 90 tablet 2   therapeutic multivitamin-minerals (THERAGRAN-M) tablet Take 1 tablet by mouth daily.     nystatin cream (MYCOSTATIN)  APPLY TO THE AFFECTED AREA(S) TWO TIMES A DAY (Patient not taking: Reported on 10/25/2024)     No current facility-administered medications on file prior to visit.   Allergies  Allergen Reactions   Tape     adhesives   Social History   Socioeconomic History   Marital status: Married    Spouse name: Not on file   Number of children: Not on file   Years of education: Not on file   Highest education level: Not on file  Occupational History   Occupation: retired  Tobacco Use   Smoking status: Never   Smokeless tobacco: Never  Vaping Use   Vaping status: Never Used  Substance and Sexual Activity   Alcohol use: Yes    Comment: rare   Drug use: No   Sexual activity: Never    Partners: Male    Birth control/protection: Surgical    Comment: hyst  Other Topics Concern   Not on file  Social History Narrative   Not on file   Social Drivers of Health   Financial Resource Strain: Low Risk  (03/28/2024)   Overall Financial Resource Strain (CARDIA)    Difficulty of Paying Living Expenses: Not hard at all  Food Insecurity: No Food Insecurity (03/28/2024)   Hunger Vital Sign    Worried About Running Out of Food in the Last Year: Never true    Ran Out of Food in the Last Year: Never true  Transportation Needs: No Transportation Needs (03/28/2024)   PRAPARE - Administrator, Civil Service (Medical): No    Lack of Transportation (Non-Medical): No  Physical Activity: Insufficiently Active (03/28/2024)   Exercise Vital Sign    Days of Exercise per Week: 3 days    Minutes of Exercise per Session: 30 min  Stress: No Stress Concern Present (03/28/2024)   [employer] of Occupational Health - Occupational Stress Questionnaire    Feeling of Stress : Not at all  Social Connections: Socially Integrated (03/28/2024)   Social Connection and Isolation Panel    Frequency of Communication with Friends and Family: More than three times a week    Frequency of Social Gatherings with Friends  and Family: Three times a week    Attends Religious Services: More than 4 times per year    Active Member of Clubs or Organizations: Yes    Attends Banker Meetings: More than 4 times per year    Marital Status: Married  Catering Manager Violence: Not At Risk (03/28/2024)   Humiliation, Afraid, Rape, and Kick questionnaire    Fear of Current or Ex-Partner: No    Emotionally Abused: No    Physically Abused: No    Sexually Abused: No     Review of Systems  All other systems reviewed and are negative.      Objective:   Physical Exam Vitals reviewed.  Constitutional:      General: She is not in acute distress.    Appearance: Normal appearance. She is normal weight. She is not ill-appearing or toxic-appearing.  HENT:  Right Ear: Tympanic membrane and ear canal normal.     Left Ear: Tympanic membrane and ear canal normal.     Nose: Congestion and rhinorrhea present.     Mouth/Throat:     Pharynx: No oropharyngeal exudate or posterior oropharyngeal erythema.  Eyes:     Extraocular Movements: Extraocular movements intact.     Conjunctiva/sclera: Conjunctivae normal.     Pupils: Pupils are equal, round, and reactive to light.  Neck:     Vascular: No carotid bruit.  Cardiovascular:     Rate and Rhythm: Normal rate and regular rhythm.     Heart sounds: Normal heart sounds. No murmur heard.    No friction rub. No gallop.  Pulmonary:     Effort: Pulmonary effort is normal. No respiratory distress.     Breath sounds: Normal breath sounds. No wheezing, rhonchi or rales.  Abdominal:     General: Bowel sounds are normal. There is no distension.     Palpations: Abdomen is soft.     Tenderness: There is no abdominal tenderness. There is no guarding or rebound.  Musculoskeletal:     Right lower leg: No edema.     Left lower leg: No edema.  Lymphadenopathy:     Cervical: No cervical adenopathy.  Skin:    General: Skin is warm.     Coloration: Skin is not jaundiced.      Findings: No rash.  Neurological:     General: No focal deficit present.     Mental Status: She is alert and oriented to person, place, and time.     Cranial Nerves: No cranial nerve deficit.     Sensory: No sensory deficit.     Motor: No weakness.     Coordination: Coordination normal.     Gait: Gait normal.  Psychiatric:        Mood and Affect: Mood normal.        Behavior: Behavior normal.        Thought Content: Thought content normal.        Judgment: Judgment normal.            Assessment & Plan:  Urinary frequency - Plan: Urinalysis, Routine w reflex microscopic  General medical examination - Plan: CBC with Differential/Platelet, Comprehensive metabolic panel with GFR, TSH, Lipid panel  Hypothyroidism, unspecified type - Plan: CBC with Differential/Platelet, Comprehensive metabolic panel with GFR, TSH, Lipid panel  Screening, lipid - Plan: CBC with Differential/Platelet, Comprehensive metabolic panel with GFR, TSH, Lipid panel  Osteoporosis without current pathological fracture, unspecified osteoporosis type I believe the patient likely has a viral upper respiratory infection and that the rhinorrhea and drainage is causing the cough.  Recommended Flonase  2 sprays each nostril daily and to treat the cough symptomatically with Hycodan 5 mL every 6 hours as needed for cough.  If she develops high fever or shortness of breath or sinus pain, the patient may have developed a secondary infection at that point.  Due to the pelvic pressure I would like to get a urinalysis.  Mammogram is up-to-date.  Bone density shows osteopenia for which the patient is taking calcium and vitamin D.  She is due for Cologuard next year.  She does not require Pap smear.  She received her flu shot today.  I recommended the COVID-vaccine.  I recommended the RSV vaccine

## 2024-10-26 ENCOUNTER — Ambulatory Visit: Payer: Self-pay | Admitting: Family Medicine

## 2024-10-26 ENCOUNTER — Other Ambulatory Visit: Payer: Self-pay | Admitting: Family Medicine

## 2024-10-26 LAB — CBC WITH DIFFERENTIAL/PLATELET
Absolute Lymphocytes: 1827 {cells}/uL (ref 850–3900)
Absolute Monocytes: 603 {cells}/uL (ref 200–950)
Basophils Absolute: 54 {cells}/uL (ref 0–200)
Basophils Relative: 0.6 %
Eosinophils Absolute: 189 {cells}/uL (ref 15–500)
Eosinophils Relative: 2.1 %
HCT: 42.2 % (ref 35.9–46.0)
Hemoglobin: 14.1 g/dL (ref 11.7–15.5)
MCH: 31.3 pg (ref 27.0–33.0)
MCHC: 33.4 g/dL (ref 31.6–35.4)
MCV: 93.8 fL (ref 81.4–101.7)
MPV: 10.5 fL (ref 7.5–12.5)
Monocytes Relative: 6.7 %
Neutro Abs: 6327 {cells}/uL (ref 1500–7800)
Neutrophils Relative %: 70.3 %
Platelets: 338 Thousand/uL (ref 140–400)
RBC: 4.5 Million/uL (ref 3.80–5.10)
RDW: 12.4 % (ref 11.0–15.0)
Total Lymphocyte: 20.3 %
WBC: 9 Thousand/uL (ref 3.8–10.8)

## 2024-10-26 LAB — COMPREHENSIVE METABOLIC PANEL WITH GFR
AG Ratio: 1.9 (calc) (ref 1.0–2.5)
ALT: 8 U/L (ref 6–29)
AST: 14 U/L (ref 10–35)
Albumin: 4.3 g/dL (ref 3.6–5.1)
Alkaline phosphatase (APISO): 58 U/L (ref 37–153)
BUN: 13 mg/dL (ref 7–25)
CO2: 27 mmol/L (ref 20–32)
Calcium: 9.3 mg/dL (ref 8.6–10.4)
Chloride: 103 mmol/L (ref 98–110)
Creat: 0.65 mg/dL (ref 0.60–1.00)
Globulin: 2.3 g/dL (ref 1.9–3.7)
Glucose, Bld: 77 mg/dL (ref 65–99)
Potassium: 3.9 mmol/L (ref 3.5–5.3)
Sodium: 141 mmol/L (ref 135–146)
Total Bilirubin: 1.6 mg/dL — ABNORMAL HIGH (ref 0.2–1.2)
Total Protein: 6.6 g/dL (ref 6.1–8.1)
eGFR: 91 mL/min/1.73m2 (ref >=60)

## 2024-10-26 LAB — LIPID PANEL
Cholesterol: 193 mg/dL (ref <200)
HDL: 51 mg/dL (ref >=50)
LDL Cholesterol (Calc): 120 mg/dL — ABNORMAL HIGH
Non-HDL Cholesterol (Calc): 142 mg/dL — ABNORMAL HIGH (ref <130)
Total CHOL/HDL Ratio: 3.8 (calc) (ref <5.0)
Triglycerides: 108 mg/dL (ref <150)

## 2024-10-26 LAB — URINALYSIS, ROUTINE W REFLEX MICROSCOPIC
Bilirubin Urine: NEGATIVE
Glucose, UA: NEGATIVE
Ketones, ur: NEGATIVE
Nitrite: NEGATIVE
RBC / HPF: NONE SEEN /HPF (ref 0–2)
Specific Gravity, Urine: 1.013 (ref 1.001–1.035)
WBC, UA: >=60 /HPF — AB (ref 0–5)
pH: 5.5 (ref 5.0–8.0)

## 2024-10-26 LAB — TSH: TSH: 2.09 m[IU]/L (ref 0.40–4.50)

## 2024-10-26 LAB — MICROSCOPIC MESSAGE

## 2024-10-26 MED ORDER — SULFAMETHOXAZOLE-TRIMETHOPRIM 800-160 MG PO TABS: 1.0000 | ORAL_TABLET | Freq: Two times a day (BID) | ORAL | 0 refills | Status: DC

## 2024-10-27 ENCOUNTER — Ambulatory Visit
Admission: EM | Admit: 2024-10-27 | Discharge: 2024-10-27 | Disposition: A | Discharge status: Home / Self Care | Attending: Family Medicine | Admitting: Family Medicine

## 2024-10-27 DIAGNOSIS — T7840XA Allergy, unspecified, initial encounter: Secondary | ICD-10-CM

## 2024-10-27 MED ORDER — DEXAMETHASONE SOD PHOSPHATE PF 10 MG/ML IJ SOLN
5.0000 mg | Freq: Once | INTRAMUSCULAR | Status: AC
  Administered 2024-10-27: 5 mg via INTRAMUSCULAR

## 2024-10-27 MED ORDER — CEPHALEXIN 500 MG PO CAPS: 500.0000 mg | ORAL_CAPSULE | Freq: Two times a day (BID) | ORAL | 0 refills | Status: AC

## 2024-10-27 NOTE — ED Provider Notes (Signed)
 [facility]    CSN: [id number] Arrival date & time: 10/27/24  1111      History   Chief Complaint No chief complaint on file.   HPI Eudelia Hiltunen Stingley is a 78 y.o. female.   Patient is a 78 year old female that presents today with allergic reaction to antibiotic.  She has taken approximate 1 dose of Bactrim  for a UTI and since start developing hives all over her body to include bilateral arms, legs and chest area.  The areas are warm but not painful and there are no open sores.  No sores in mouth.  Or mouth pain.     Past Medical History:  Diagnosis Date   Arthritis    Cataract    Mixed forms OU   Complication of anesthesia    GERD (gastroesophageal reflux disease) 02/28/2013   Hyperlipidemia 02/28/2013   Hypothyroid    Hypothyroid 09/26/2013   Hypothyroidism 03/31/2020   Macular degeneration    non-exu OU   Osteoporosis    PONV (postoperative nausea and vomiting)     Patient Active Problem List   Diagnosis Date Noted   Schatzki's ring 09/08/2023   Gastritis and gastroduodenitis 09/08/2023   Gastric polyp 09/08/2023   Essential hypertension 09/07/2023   Esophageal dysphagia 09/07/2023   Colon cancer screening 09/07/2023   Palpitations 12/09/2022   History of mitral valve prolapse 12/09/2022   Screening for cardiovascular condition 12/09/2022   Hypothyroidism 03/31/2020   GERD (gastroesophageal reflux disease) 03/31/2020   Osteoporosis without current pathological fracture 10/01/2019   Need for immunization against influenza 10/01/2019    Past Surgical History:  Procedure Laterality Date   ABDOMINAL HYSTERECTOMY     BALLOON DILATION  09/08/2023   Procedure: BALLOON DILATION;  Surgeon: Cinderella Deatrice FALCON, MD;  Location: [facility];  Service: Endoscopy;;   BIOPSY  09/08/2023   Procedure: BIOPSY;  Surgeon: Cinderella Deatrice FALCON, MD;  Location: [facility];  Service: Endoscopy;;   BREAST BIOPSY Right 10/09/2021   Benign Breast tissue with increased  stromal fibrosis and adenosis with calcifications- No malignancy identfied.   EAR CYST EXCISION  02/23/2012   Procedure: CYST REMOVAL;  Surgeon: Arley JONELLE Curia, MD;  Location: [facility];  Service: Orthopedics;  Laterality: Left;  debridement distal interphalangeal left index finger, excision cyst/loose body   ESOPHAGOGASTRODUODENOSCOPY (EGD) WITH PROPOFOL  N/A 09/08/2023   Procedure: ESOPHAGOGASTRODUODENOSCOPY (EGD) WITH PROPOFOL ;  Surgeon: Cinderella Deatrice FALCON, MD;  Location: [facility];  Service: Endoscopy;  Laterality: N/A;  8:30AM;ASA 1-2   FRACTURE SURGERY  09/2013   right ankle   POLYPECTOMY  09/08/2023   Procedure: POLYPECTOMY;  Surgeon: Cinderella Deatrice FALCON, MD;  Location: [facility];  Service: Endoscopy;;   spinal cyst removal     02/23/2012    OB History     Gravida  3   Para  2   Term  2   Preterm      AB  1   Living  2      SAB  1   IAB      Ectopic      Multiple      Live Births  2            Home Medications    Prior to Admission medications   Medication Sig Start Date End Date Taking? Authorizing Provider  cephALEXin  (KEFLEX ) 500 MG capsule Take 1 capsule (500 mg total) by mouth 2 (two) times daily for 7 days. 10/27/24 11/03/24  Yes Rheana Casebolt A, FNP  albuterol  (VENTOLIN  HFA) 108 (90 Base) MCG/ACT inhaler Inhale 2 puffs into the lungs every 6 (six) hours as needed for wheezing or shortness of breath. 04/03/21   Duanne Butler DASEN, MD  celecoxib  (CELEBREX ) 200 MG capsule TAKE ONE CAPSULE (200MG  TOTAL) BY MOUTH DAILY 02/21/24   Duanne Butler DASEN, MD  fluticasone  (FLONASE ) 50 MCG/ACT nasal spray Place 2 sprays into both nostrils daily. 10/25/24   Duanne Butler DASEN, MD  fluticasone  (FLONASE ) 50 MCG/ACT nasal spray Place 2 sprays into both nostrils daily. 10/25/24   Duanne Butler DASEN, MD  HYDROcodone  bit-homatropine (HYCODAN) 5-1.5 MG/5ML syrup Take 5 mLs by mouth every 8 (eight) hours as needed for cough. 10/25/24   Duanne Butler DASEN, MD   levothyroxine  (SYNTHROID ) 25 MCG tablet Take 1 tablet (25 mcg total) by mouth daily. 07/22/24   Duanne Butler DASEN, MD  montelukast  (SINGULAIR ) 10 MG tablet TAKE ONE TABLET BY MOUTH EVERY NIGHT AT BEDTIME 07/12/24   Duanne Butler DASEN, MD  nystatin cream (MYCOSTATIN) APPLY TO THE AFFECTED AREA(S) TWO TIMES A DAY Patient not taking: Reported on 10/25/2024 11/25/20   [provider]  Omega-3 Fatty Acids (FISH OIL) 1000 MG CAPS Take 1,000 mg by mouth.    [provider]  pantoprazole  (PROTONIX ) 40 MG tablet TAKE ONE TABLET (40MG  TOTAL) BY MOUTH DAILY 08/06/24   Duanne Butler DASEN, MD  therapeutic multivitamin-minerals (THERAGRAN-M) tablet Take 1 tablet by mouth daily.    [provider]    Family History Family History  Problem Relation Age of Onset   Diabetes Mother     Social History Social History   Tobacco Use   Smoking status: Never   Smokeless tobacco: Never  Vaping Use   Vaping status: Never Used  Substance Use Topics   Alcohol use: Yes    Comment: rare   Drug use: No     Allergies   Tape and Sulfa  antibiotics   Review of Systems Review of Systems  See HPI Physical Exam Triage Vital Signs ED Triage Vitals  Encounter Vitals Group     BP 10/27/24 1119 129/83     Girls Systolic BP Percentile --      Girls Diastolic BP Percentile --      Boys Systolic BP Percentile --      Boys Diastolic BP Percentile --      Pulse Rate 10/27/24 1119 92     Resp 10/27/24 1119 20     Temp 10/27/24 1119 98.1 F (36.7 C)     Temp Source 10/27/24 1119 Oral     SpO2 10/27/24 1119 95 %     Weight --      Height --      Head Circumference --      Peak Flow --      Pain Score 10/27/24 1117 0     Pain Loc --      Pain Education --      Exclude from Growth Chart --    No data found.  Updated Vital Signs BP 129/83 (BP Location: Right Arm)   Pulse 92   Temp 98.1 F (36.7 C) (Oral)   Resp 20   SpO2 95%   Visual Acuity Right Eye Distance:   Left Eye  Distance:   Bilateral Distance:    Right Eye Near:   Left Eye Near:    Bilateral Near:     Physical Exam Vitals and nursing note reviewed.  Constitutional:  General: She is not in acute distress.    Appearance: Normal appearance. She is not ill-appearing, toxic-appearing or diaphoretic.  Pulmonary:     Effort: Pulmonary effort is normal.  Skin:    Findings: Rash present.     Comments: Erythematous hives located to bilateral arms, back of legs and chest area.  Chest area very red.  Neurological:     Mental Status: She is alert.  Psychiatric:        Mood and Affect: Mood normal.      UC Treatments / Results  Labs (all labs ordered are listed, but only abnormal results are displayed) Labs Reviewed - No data to display  EKG   Radiology No results found.  Procedures Procedures (including critical care time)  Medications Ordered in UC Medications  dexamethasone  (DECADRON ) injection 5 mg (5 mg Intramuscular Given 10/27/24 1143)    Initial Impression / Assessment and Plan / UC Course  I have reviewed the triage vital signs and the nursing notes.  Pertinent labs & imaging results that were available during my care of the patient were reviewed by me and considered in my medical decision making (see chart for details).     Allergic reaction-she is having allergic reaction to Bactrim .  She has already stopped the medication.  There are no concerning lesions on exam at this time.  Will go ahead and treat for allergic reaction with small dose of Decadron  here in clinic today.  Recommended antihistamines over-the-counter. Will change the antibiotic to Keflex  for UTI based on allergy to Bactrim .  Recommend follow-up with her doctor next week in clinic for recheck of her urine. If the hives or rash worsens and she starts evolving sores especially on the face around the mouth she will need to follow-up and go to the ER.  Patient was seen and agreed Final Clinical Impressions(s)  / UC Diagnoses   Final diagnoses:  Allergic reaction, initial encounter     Discharge Instructions      Gave you a steroid injection here today for the allergic reaction.  Stop the Bactrim  . Changing the antibiotic to keflex  for  the UTI. Recommended following up next week for recheck of the urine to see if clearing up. Drink lots of fluids.  You can take Zyrtec, Claritin or Benadryl for itching and irritation if needed.  If your symptoms worsening to include worsening rash, sores in the mouth or pain you will need to go to the ER   ED Prescriptions     Medication Sig Dispense Auth. Provider   cephALEXin  (KEFLEX ) 500 MG capsule Take 1 capsule (500 mg total) by mouth 2 (two) times daily for 7 days. 14 capsule Adah Corning A, FNP      PDMP not reviewed this encounter.   Adah Corning LABOR, FNP 10/27/24 1555

## 2024-10-27 NOTE — Discharge Instructions (Addendum)
 Gave you a steroid injection here today for the allergic reaction.  Stop the Bactrim  . Changing the antibiotic to keflex  for  the UTI. Recommended following up next week for recheck of the urine to see if clearing up. Drink lots of fluids.  You can take Zyrtec, Claritin or Benadryl for itching and irritation if needed.  If your symptoms worsening to include worsening rash, sores in the mouth or pain you will need to go to the ER

## 2024-10-27 NOTE — ED Triage Notes (Signed)
 Pt reports after taking bactrim  x 1 day she began to have hives and a red rash.   Pt only took 1 pill has since stopped

## 2024-10-31 ENCOUNTER — Telehealth: Payer: Self-pay

## 2024-10-31 NOTE — Telephone Encounter (Signed)
 Copied from CRM #[id number]. Topic: Clinical - Medication Question >> Oct 31, 2024 10:44 AM Dedra B wrote: Reason for CRM: Pt called to let Dr. Duanne know that she broke out in hives after taking Bactrim . She went to urgent care and was prescribed Keflex  500 mg.

## 2024-11-05 DIAGNOSIS — Z4689 Encounter for fitting and adjustment of other specified devices: Secondary | ICD-10-CM | POA: Diagnosis not present

## 2024-11-05 DIAGNOSIS — N390 Urinary tract infection, site not specified: Secondary | ICD-10-CM | POA: Diagnosis not present

## 2024-11-10 ENCOUNTER — Other Ambulatory Visit: Payer: Self-pay | Admitting: Family Medicine

## 2024-11-10 DIAGNOSIS — J40 Bronchitis, not specified as acute or chronic: Secondary | ICD-10-CM

## 2024-12-18 ENCOUNTER — Encounter: Payer: Self-pay | Admitting: Family Medicine

## 2024-12-18 ENCOUNTER — Ambulatory Visit: Admitting: Family Medicine

## 2024-12-18 VITALS — BP 122/80 | HR 68 | Temp 97.8°F | Ht 62.0 in | Wt 144.4 lb

## 2024-12-18 DIAGNOSIS — R82998 Other abnormal findings in urine: Secondary | ICD-10-CM | POA: Diagnosis not present

## 2024-12-18 DIAGNOSIS — R300 Dysuria: Secondary | ICD-10-CM | POA: Diagnosis not present

## 2024-12-18 MED ORDER — CIPROFLOXACIN HCL 500 MG PO TABS: 500.0000 mg | ORAL_TABLET | Freq: Two times a day (BID) | ORAL | 0 refills | Status: AC

## 2024-12-18 NOTE — Progress Notes (Signed)
 "  Subjective:    Patient ID: Gina Gibbs, female    DOB: [birth year], 79 y.o.   MRN: [id number]  HPI I treated the patient for UTI in December based on an abnormal urinalysis and her symptoms.  We did not get a urine culture at that time.  I gave her Bactrim .  Within 24 hours, she had an allergic reaction to the Bactrim  and went to an urgent care where she was given Keflex .  This was her second UTI that she had had within a month.  Now will 2 to 3 weeks later she has developed the same symptoms again.  She reports increased urinary urgency and frequency but she is also having hesitancy.  She reports hematuria.  She states that her urine is blood-tinged.  Unfortunately she is unable to give us  a urine sample today.  She saw her gynecologist in December who recommended trying Estrace  cream to help prevent recurrent UTIs.  The patient took the cream for less than a month and stopped it due to breast tenderness and foul-smelling vaginal discharge.  If today's symptoms are due to a urinary tract infection this would be the third 1 in less than 2 months Past Medical History:  Diagnosis Date   Arthritis    Cataract    Mixed forms OU   Complication of anesthesia    GERD (gastroesophageal reflux disease) 02/28/2013   Hyperlipidemia 02/28/2013   Hypothyroid    Hypothyroid 09/26/2013   Hypothyroidism 03/31/2020   Macular degeneration    non-exu OU   Osteoporosis    PONV (postoperative nausea and vomiting)    Past Surgical History:  Procedure Laterality Date   ABDOMINAL HYSTERECTOMY     BALLOON DILATION  09/08/2023   Procedure: BALLOON DILATION;  Surgeon: Cinderella Deatrice FALCON, MD;  [facility]: [facility];  Service: Endoscopy;;   BIOPSY  09/08/2023   Procedure: BIOPSY;  Surgeon: Cinderella Deatrice FALCON, MD;  Location: [facility];  Service: Endoscopy;;   BREAST BIOPSY Right 10/09/2021   Benign Breast tissue with increased stromal fibrosis and adenosis with calcifications- No malignancy identfied.    EAR CYST EXCISION  02/23/2012   Procedure: CYST REMOVAL;  Surgeon: Arley JONELLE Curia, MD;  Location: [facility];  Service: Orthopedics;  Laterality: Left;  debridement distal interphalangeal left index finger, excision cyst/loose body   ESOPHAGOGASTRODUODENOSCOPY (EGD) WITH PROPOFOL  N/A 09/08/2023   Procedure: ESOPHAGOGASTRODUODENOSCOPY (EGD) WITH PROPOFOL ;  Surgeon: Cinderella Deatrice FALCON, MD;  Location: [facility];  Service: Endoscopy;  Laterality: N/A;  8:30AM;ASA 1-2   FRACTURE SURGERY  09/2013   right ankle   POLYPECTOMY  09/08/2023   Procedure: POLYPECTOMY;  Surgeon: Cinderella Deatrice FALCON, MD;  [facility]: [facility];  Service: Endoscopy;;   spinal cyst removal     02/23/2012   Current Outpatient Medications on File Prior to Visit  Medication Sig Dispense Refill   albuterol  (VENTOLIN  HFA) 108 (90 Base) MCG/ACT inhaler Inhale 2 puffs into the lungs every 6 (six) hours as needed for wheezing or shortness of breath. 8 g 0   celecoxib  (CELEBREX ) 200 MG capsule TAKE ONE CAPSULE (200MG  TOTAL) BY MOUTH DAILY 90 capsule 2   fluticasone  (FLONASE ) 50 MCG/ACT nasal spray Place 2 sprays into both nostrils daily. 16 g 0   fluticasone  (FLONASE ) 50 MCG/ACT nasal spray Place 2 sprays into both nostrils daily. 16 g 6   HYDROcodone  bit-homatropine (HYCODAN) 5-1.5 MG/5ML syrup Take 5 mLs by mouth every 8 (eight) hours as needed for  cough. 120 mL 0   levothyroxine  (SYNTHROID ) 25 MCG tablet Take 1 tablet (25 mcg total) by mouth daily. 90 tablet 1   montelukast  (SINGULAIR ) 10 MG tablet TAKE ONE TABLET BY MOUTH EVERY NIGHT AT BEDTIME 30 tablet 3   nystatin cream (MYCOSTATIN) APPLY TO THE AFFECTED AREA(S) TWO TIMES A DAY (Patient not taking: Reported on 10/25/2024)     Omega-3 Fatty Acids (FISH OIL) 1000 MG CAPS Take 1,000 mg by mouth.     pantoprazole  (PROTONIX ) 40 MG tablet TAKE ONE TABLET (40MG  TOTAL) BY MOUTH DAILY 90 tablet 2   therapeutic multivitamin-minerals (THERAGRAN-M) tablet Take 1 tablet by  mouth daily.     No current facility-administered medications on file prior to visit.   Allergies  Allergen Reactions   Tape     adhesives   Sulfa  Antibiotics Hives and Rash   Social History   Socioeconomic History   Marital status: Married    Spouse name: Not on file   Number of children: Not on file   Years of education: Not on file   Highest education level: Not on file  Occupational History   Occupation: retired  Tobacco Use   Smoking status: Never   Smokeless tobacco: Never  Vaping Use   Vaping status: Never Used  Substance and Sexual Activity   Alcohol use: Yes    Comment: rare   Drug use: No   Sexual activity: Never    Partners: Male    Birth control/protection: Surgical    Comment: hyst  Other Topics Concern   Not on file  Social History Narrative   Not on file   Social Drivers of Health   Tobacco Use: Low Risk (12/18/2024)   Patient History    Smoking Tobacco Use: Never    Smokeless Tobacco Use: Never    Passive Exposure: Not on file  Financial Resource Strain: Low Risk (03/28/2024)   Overall Financial Resource Strain (CARDIA)    Difficulty of Paying Living Expenses: Not hard at all  Food Insecurity: No Food Insecurity (03/28/2024)   Hunger Vital Sign    Worried About Running Out of Food in the Last Year: Never true    Ran Out of Food in the Last Year: Never true  Transportation Needs: No Transportation Needs (03/28/2024)   PRAPARE - Administrator, Civil Service (Medical): No    Lack of Transportation (Non-Medical): No  Physical Activity: Insufficiently Active (03/28/2024)   Exercise Vital Sign    Days of Exercise per Week: 3 days    Minutes of Exercise per Session: 30 min  Stress: No Stress Concern Present (03/28/2024)   [employer] of Occupational Health - Occupational Stress Questionnaire    Feeling of Stress : Not at all  Social Connections: Socially Integrated (03/28/2024)   Social Connection and Isolation Panel    Frequency of  Communication with Friends and Family: More than three times a week    Frequency of Social Gatherings with Friends and Family: Three times a week    Attends Religious Services: More than 4 times per year    Active Member of Clubs or Organizations: Yes    Attends Banker Meetings: More than 4 times per year    Marital Status: Married  Catering Manager Violence: Not At Risk (03/28/2024)   Humiliation, Afraid, Rape, and Kick questionnaire    Fear of Current or Ex-Partner: No    Emotionally Abused: No    Physically Abused: No    Sexually Abused:  No  Depression (PHQ2-9): Low Risk (03/28/2024)   Depression (PHQ2-9)    PHQ-2 Score: 0  Alcohol Screen: Low Risk (03/28/2024)   Alcohol Screen    Last Alcohol Screening Score (AUDIT): 0  Housing: Unknown (03/28/2024)   Housing Stability Vital Sign    Unable to Pay for Housing in the Last Year: No    Number of Times Moved in the Last Year: Not on file    Homeless in the Last Year: No  Utilities: Not At Risk (03/28/2024)   [employer] Utilities    Threatened with loss of utilities: No  Health Literacy: Adequate Health Literacy (03/28/2024)   B1300 Health Literacy    Frequency of need for help with medical instructions: Never     Review of Systems  All other systems reviewed and are negative.      Objective:   Physical Exam Vitals reviewed.  Constitutional:      Appearance: Normal appearance.  Cardiovascular:     Rate and Rhythm: Normal rate and regular rhythm.     Heart sounds: Normal heart sounds. No murmur heard.    No friction rub. No gallop.  Pulmonary:     Effort: Pulmonary effort is normal. No respiratory distress.     Breath sounds: Normal breath sounds. No wheezing, rhonchi or rales.  Abdominal:     General: Bowel sounds are normal. There is no distension.     Palpations: Abdomen is soft.     Tenderness: There is no abdominal tenderness.  Musculoskeletal:     Right lower leg: No edema.     Left lower leg: No edema.  Skin:     General: Skin is warm.     Coloration: Skin is not jaundiced.     Findings: No rash.  Neurological:     General: No focal deficit present.     Mental Status: She is alert and oriented to person, place, and time.     Cranial Nerves: No cranial nerve deficit.     Sensory: No sensory deficit.     Motor: No weakness.     Coordination: Coordination normal.     Gait: Gait normal.            Assessment & Plan:  Dysuria - Plan: Urinalysis, Routine w reflex microscopic Patient is unable to give us  a urine sample today.  I asked her to hold off taking the antibiotics until tomorrow morning.  I would like her to collect a urine sample and drop it off so that we can get a culture to confirm whether or not she is having recurrent UTIs.  She can then start taking Cipro  500 p.o. twice daily for 7 days.  I will send this urine for a culture as well as a urinalysis.  If there is no UTI on culture I would recommend urology consultation for cystoscopy.  If the patient continues to have recurrent UTIs, I have recommended retrying Estrace  "

## 2024-12-19 LAB — URINALYSIS, ROUTINE W REFLEX MICROSCOPIC
Bilirubin Urine: NEGATIVE
Glucose, UA: NEGATIVE
Hyaline Cast: NONE SEEN /LPF
Ketones, ur: NEGATIVE
Nitrite: NEGATIVE
Protein, ur: NEGATIVE
Specific Gravity, Urine: 1.019 (ref 1.001–1.035)
pH: <=5 — AB (ref 5.0–8.0)

## 2024-12-19 LAB — MICROSCOPIC MESSAGE

## 2024-12-20 ENCOUNTER — Ambulatory Visit: Payer: Self-pay | Admitting: Family Medicine

## 2024-12-24 ENCOUNTER — Ambulatory Visit: Payer: Self-pay

## 2024-12-24 NOTE — Telephone Encounter (Signed)
 FYI Only or Action Required?: FYI only for provider: appointment scheduled on 12/25/24.  Patient was last seen in primary care on 12/18/2024 by Duanne Butler DASEN, MD.  Called Nurse Triage reporting Flank Pain.  Symptoms began today.  Interventions attempted: OTC medications: AZO and Prescription medications: Cipro  .  Symptoms are: gradually worsening.  Triage Disposition: See Physician Within 24 Hours  Patient/caregiver understands and will follow disposition?: Yes   Reason for Disposition  Blood in urine (red, pink, or tea-colored)  Answer Assessment - Initial Assessment Questions Pt taking ciprofloxacin  (CIPRO ) 500 MG tablet for a UTI (culture results not back yet) and has taken AZO this morning.   1. LOCATION: Where does it hurt? (e.g., left, right)     Left side today, has been on the right and lower abd/ pelvic area  2. ONSET: When did the pain start?     This morning  3. SEVERITY: How bad is the pain? (e.g., Scale 1-10; mild, moderate, or severe)     5/10  4. PATTERN: Does the pain come and go, or is it constant?      Constant  5. CAUSE: What do you think is causing the pain?     Has a UTI, unsure if she has a kidney stone  6. OTHER SYMPTOMS:  Do you have any other symptoms? (e.g., fever, abdomen pain, vomiting, leg weakness, burning with urination, blood in urine)     Pink colored urine last night, reports tea colored urine previously. Reports being able to urinate, sometimes gets a few drops and other times has a full stream.  Protocols used: Flank Pain-A-AH  Message from Hornbeck C sent at 12/24/2024  1:07 PM EST  Summary: lower back and abdominal concern   Reason for Triage: The patient shares that they have experienced ongoing lower back and abdominal discomfort and believe it may be related to their most recent labs. The patient shares that the discomfort occurs off and on.  The patient would like to speak with a member of clinical staff about their  ongoing discomfort and most recent results when possible

## 2024-12-25 ENCOUNTER — Ambulatory Visit
Admission: RE | Admit: 2024-12-25 | Discharge: 2024-12-25 | Disposition: A | Source: Ambulatory Visit | Attending: Family Medicine | Admitting: Family Medicine

## 2024-12-25 ENCOUNTER — Encounter: Payer: Self-pay | Admitting: Family Medicine

## 2024-12-25 ENCOUNTER — Ambulatory Visit: Admitting: Family Medicine

## 2024-12-25 VITALS — BP 126/82 | HR 77 | Temp 97.7°F | Ht 62.0 in | Wt 145.0 lb

## 2024-12-25 DIAGNOSIS — R319 Hematuria, unspecified: Secondary | ICD-10-CM

## 2024-12-25 DIAGNOSIS — R300 Dysuria: Secondary | ICD-10-CM | POA: Diagnosis not present

## 2024-12-25 LAB — URINALYSIS, ROUTINE W REFLEX MICROSCOPIC
Bilirubin Urine: NEGATIVE
Glucose, UA: NEGATIVE
Hyaline Cast: NONE SEEN /LPF
Ketones, ur: NEGATIVE
Nitrite: NEGATIVE
Specific Gravity, Urine: 1.022 (ref 1.001–1.035)
pH: 5 (ref 5.0–8.0)

## 2024-12-25 LAB — MICROSCOPIC MESSAGE

## 2024-12-25 MED ORDER — CEFTRIAXONE SODIUM 1 G IJ SOLR
1.0000 g | Freq: Once | INTRAMUSCULAR | Status: AC
  Administered 2024-12-25: 1 g via INTRAMUSCULAR

## 2024-12-25 MED ORDER — NITROFURANTOIN MONOHYD MACRO 100 MG PO CAPS: 100.0000 mg | ORAL_CAPSULE | Freq: Two times a day (BID) | ORAL | 0 refills | Status: AC

## 2024-12-25 NOTE — Addendum Note (Signed)
 Addended by: ANGELENA RONAL SLATER MARLA on: 12/25/2024 03:48 PM   Modules accepted: Orders

## 2024-12-26 ENCOUNTER — Ambulatory Visit: Payer: Self-pay | Admitting: Family Medicine

## 2024-12-26 LAB — URINE CULTURE
MICRO NUMBER:: 17542749
Result:: NO GROWTH
SPECIMEN QUALITY:: ADEQUATE

## 2025-04-03 ENCOUNTER — Ambulatory Visit

## 2025-10-28 ENCOUNTER — Encounter: Admitting: Family Medicine
